# Patient Record
Sex: Female | Born: 1955 | Hispanic: Refuse to answer | Marital: Married | Smoking: Former smoker
Health system: Southern US, Community
[De-identification: ages and names within clinical notes are randomized; demographics above are authoritative.]

## PROBLEM LIST (undated history)

## (undated) DIAGNOSIS — N6019 Diffuse cystic mastopathy of unspecified breast: Secondary | ICD-10-CM

## (undated) DIAGNOSIS — K219 Gastro-esophageal reflux disease without esophagitis: Secondary | ICD-10-CM

## (undated) DIAGNOSIS — D229 Melanocytic nevi, unspecified: Secondary | ICD-10-CM

## (undated) DIAGNOSIS — F329 Major depressive disorder, single episode, unspecified: Secondary | ICD-10-CM

## (undated) DIAGNOSIS — E785 Hyperlipidemia, unspecified: Secondary | ICD-10-CM

## (undated) DIAGNOSIS — E559 Vitamin D deficiency, unspecified: Secondary | ICD-10-CM

## (undated) DIAGNOSIS — F419 Anxiety disorder, unspecified: Secondary | ICD-10-CM

## (undated) DIAGNOSIS — C4491 Basal cell carcinoma of skin, unspecified: Secondary | ICD-10-CM

## (undated) DIAGNOSIS — F32A Depression, unspecified: Secondary | ICD-10-CM

## (undated) DIAGNOSIS — M8580 Other specified disorders of bone density and structure, unspecified site: Secondary | ICD-10-CM

## (undated) DIAGNOSIS — R7303 Prediabetes: Secondary | ICD-10-CM

## (undated) DIAGNOSIS — T7840XA Allergy, unspecified, initial encounter: Secondary | ICD-10-CM

## (undated) HISTORY — PX: HERNIA REPAIR: SHX51

## (undated) HISTORY — DX: Depression, unspecified: F32.A

## (undated) HISTORY — DX: Allergy, unspecified, initial encounter: T78.40XA

## (undated) HISTORY — DX: Hyperlipidemia, unspecified: E78.5

## (undated) HISTORY — DX: Major depressive disorder, single episode, unspecified: F32.9

## (undated) HISTORY — DX: Prediabetes: R73.03

## (undated) HISTORY — DX: Diffuse cystic mastopathy of unspecified breast: N60.19

## (undated) HISTORY — DX: Vitamin D deficiency, unspecified: E55.9

## (undated) HISTORY — DX: Other specified disorders of bone density and structure, unspecified site: M85.80

## (undated) HISTORY — PX: CERVIX LESION DESTRUCTION: SHX591

---
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
## 1999-01-18 ENCOUNTER — Other Ambulatory Visit: Admission: RE | Admit: 1999-01-18 | Discharge: 1999-01-18 | Payer: Self-pay | Admitting: *Deleted

## 2000-01-28 ENCOUNTER — Other Ambulatory Visit: Admission: RE | Admit: 2000-01-28 | Discharge: 2000-01-28 | Payer: Self-pay | Admitting: *Deleted

## 2001-02-11 ENCOUNTER — Other Ambulatory Visit: Admission: RE | Admit: 2001-02-11 | Discharge: 2001-02-11 | Payer: Self-pay | Admitting: *Deleted

## 2001-06-04 ENCOUNTER — Encounter (INDEPENDENT_AMBULATORY_CARE_PROVIDER_SITE_OTHER): Payer: Self-pay | Admitting: Specialist

## 2001-06-04 ENCOUNTER — Other Ambulatory Visit: Admission: RE | Admit: 2001-06-04 | Discharge: 2001-06-04 | Payer: Self-pay | Admitting: *Deleted

## 2002-04-13 DIAGNOSIS — D229 Melanocytic nevi, unspecified: Secondary | ICD-10-CM

## 2002-04-13 HISTORY — DX: Melanocytic nevi, unspecified: D22.9

## 2003-02-08 ENCOUNTER — Other Ambulatory Visit: Admission: RE | Admit: 2003-02-08 | Discharge: 2003-02-08 | Payer: Self-pay | Admitting: Obstetrics & Gynecology

## 2004-03-13 ENCOUNTER — Other Ambulatory Visit: Admission: RE | Admit: 2004-03-13 | Discharge: 2004-03-13 | Payer: Self-pay | Admitting: Obstetrics & Gynecology

## 2004-12-08 HISTORY — PX: DILATION AND CURETTAGE OF UTERUS: SHX78

## 2004-12-08 HISTORY — PX: NOVASURE ABLATION: SHX5394

## 2005-03-26 ENCOUNTER — Other Ambulatory Visit: Admission: RE | Admit: 2005-03-26 | Discharge: 2005-03-26 | Payer: Self-pay | Admitting: Obstetrics & Gynecology

## 2005-07-07 ENCOUNTER — Encounter: Admission: RE | Admit: 2005-07-07 | Discharge: 2005-07-07 | Payer: Self-pay | Admitting: Obstetrics & Gynecology

## 2005-11-07 ENCOUNTER — Encounter (INDEPENDENT_AMBULATORY_CARE_PROVIDER_SITE_OTHER): Payer: Self-pay | Admitting: Specialist

## 2005-11-07 ENCOUNTER — Ambulatory Visit (HOSPITAL_COMMUNITY): Admission: RE | Admit: 2005-11-07 | Discharge: 2005-11-07 | Payer: Self-pay | Admitting: Obstetrics & Gynecology

## 2007-04-08 LAB — HM DEXA SCAN

## 2008-02-04 ENCOUNTER — Ambulatory Visit (HOSPITAL_COMMUNITY): Admission: RE | Admit: 2008-02-04 | Discharge: 2008-02-04 | Payer: Self-pay | Admitting: Internal Medicine

## 2008-02-06 LAB — HM COLONOSCOPY

## 2011-04-25 NOTE — Op Note (Signed)
[name], [name] NO.:  [id number]   MEDICAL RECORD NO.:  [id number]          PATIENT TYPE:  AMB   LOCATION:  SDC                           FACILITY:  [facility]   PHYSICIAN:  Freddy Finner, M.D.   DATE OF BIRTH:  [birth year]   DATE OF PROCEDURE:  11/07/2005  DATE OF DISCHARGE:                                 OPERATIVE REPORT   PREOPERATIVE. DIAGNOSES:  1.  New finding of uterine fibroid.  2.  Filling defect on sonohysterogram.  3.  Clinical symptoms of menorrhagia.   POSTOPERATIVE DIAGNOSES:  1.  Submucous myoma.  2.  Intramural myomata.   OPERATIVE PROCEDURE:  Hysteroscopy, dilatation and curettage, submucous  myomectomy, NovaSure endometrial ablation.   SURGEON:  Freddy Finner, M.D.   ANESTHESIA:  General.   INTRAOPERATIVE COMPLICATIONS:  Cervical laceration, repaired without  difficulty and without sequelae.   The patient is a 55 year old with a new clinical finding of uterine  leiomyomata and a new clinical pattern of menorrhagia.  Sonohysterogram  revealed a 10 x 8 mm filling defect in the endometrial cavity consistent  with a polyp or myoma.  Based on her findings and symptoms, she is now  admitted for a hysteroscopy, D&C and NovaSure endometrial ablation.   She was admitted on the morning of surgery.  She was given __________  antibiotic preoperatively.  She was brought to the operating room, placed  under adequate general anesthesia and placed in the dorsal lithotomy  position.  A Betadine prep was carried out in the usual fashion.  The cervix  was visualized using a bivalve speculum.  The cervix was grasped on the  anterior lip with a single-tooth tenaculum.  The cervix sounded to 2 cm.  Endometrial cavity and cervix sounded to 8.5 with a cavity length then of  6.5 cm.  The cervix was progressively dilated with Hegar dilators to 25.  The cervix was very stenotic, and dilatation required some significant  effort.  This produced a laceration of  the anterior cervical lip requiring a  Jacobs tenaculum to complete the initial course of the procedure.  After  adequately dilating the cervix, a 12.5 degree ACMI hysteroscope was  introduced using lactated Ringer's as a distending medium.  The mass was  identified and photographed.  Using ring forceps and the curette, the  submucous myoma was retrieved and the endometrium curetted and a sample  submitted for examination.  Reinspection of the cavity revealed absence of  the mass.  The NovaSure device was then placed in the standard fashion and  endometrial ablation was carried out.  A total of 53 seconds was required,  total power of 80.  The cavity width was 2.5 cm.  Reinspection of the cavity  revealed complete endometrial ablation visibly.  Photographs were made of  this also and are retained in the office record.  The procedure at this  point was terminated.  The cervical laceration on the anterior  lip was sutured with a 0 Monocryl with complete hemostasis and closure of  the mucosa.  The patient was  given Toradol 30 mg IV, 30 mg IM.  She was  awakened and taken to recovery in good condition.  She will be given routine  outpatient surgical instructions.  She is to follow up in the office in  approximately two weeks.      Freddy Finner, M.D.  Electronically Signed     WRN/MEDQ  D:  11/07/2005  T:  11/07/2005  Job:  [id number]

## 2011-08-05 ENCOUNTER — Other Ambulatory Visit: Payer: Self-pay | Admitting: Physician Assistant

## 2011-10-07 ENCOUNTER — Other Ambulatory Visit: Payer: Self-pay | Admitting: Physician Assistant

## 2011-10-07 DIAGNOSIS — C4491 Basal cell carcinoma of skin, unspecified: Secondary | ICD-10-CM

## 2011-10-07 HISTORY — DX: Basal cell carcinoma of skin, unspecified: C44.91

## 2011-10-09 HISTORY — PX: SKIN CANCER EXCISION: SHX779

## 2011-12-09 LAB — HM PAP SMEAR

## 2012-01-20 ENCOUNTER — Ambulatory Visit
Admission: RE | Admit: 2012-01-20 | Discharge: 2012-01-20 | Disposition: A | Discharge status: Home / Self Care | Payer: 59 | Source: Ambulatory Visit | Attending: Internal Medicine | Admitting: Internal Medicine

## 2012-01-20 ENCOUNTER — Other Ambulatory Visit: Payer: Self-pay | Admitting: Internal Medicine

## 2012-01-20 DIAGNOSIS — M25511 Pain in right shoulder: Secondary | ICD-10-CM

## 2012-05-17 LAB — FECAL OCCULT BLOOD, GUAIAC: Fecal Occult Blood: NEGATIVE

## 2012-09-08 ENCOUNTER — Other Ambulatory Visit: Payer: Self-pay | Admitting: Obstetrics & Gynecology

## 2012-09-08 DIAGNOSIS — N6320 Unspecified lump in the left breast, unspecified quadrant: Secondary | ICD-10-CM

## 2012-09-14 ENCOUNTER — Ambulatory Visit
Admission: RE | Admit: 2012-09-14 | Discharge: 2012-09-14 | Disposition: A | Discharge status: Home / Self Care | Payer: 59 | Source: Ambulatory Visit | Attending: Obstetrics & Gynecology | Admitting: Obstetrics & Gynecology

## 2012-09-14 DIAGNOSIS — N6320 Unspecified lump in the left breast, unspecified quadrant: Secondary | ICD-10-CM

## 2012-12-08 LAB — HM MAMMOGRAPHY

## 2013-09-27 ENCOUNTER — Encounter: Payer: Self-pay | Admitting: Internal Medicine

## 2013-10-12 ENCOUNTER — Ambulatory Visit: Payer: 59 | Admitting: Emergency Medicine

## 2013-10-12 ENCOUNTER — Encounter: Payer: Self-pay | Admitting: Emergency Medicine

## 2013-10-12 VITALS — BP 122/64 | HR 62 | Temp 98.3°F | Resp 16 | Wt 117.0 lb

## 2013-10-12 DIAGNOSIS — E785 Hyperlipidemia, unspecified: Secondary | ICD-10-CM

## 2013-10-12 DIAGNOSIS — E782 Mixed hyperlipidemia: Secondary | ICD-10-CM

## 2013-10-12 DIAGNOSIS — F32A Depression, unspecified: Secondary | ICD-10-CM

## 2013-10-12 DIAGNOSIS — F329 Major depressive disorder, single episode, unspecified: Secondary | ICD-10-CM

## 2013-10-12 DIAGNOSIS — E559 Vitamin D deficiency, unspecified: Secondary | ICD-10-CM

## 2013-10-12 LAB — HEPATIC FUNCTION PANEL
ALT: 14 U/L (ref 0–35)
AST: 20 U/L (ref 0–37)
Albumin: 4.5 g/dL (ref 3.5–5.2)
Alkaline Phosphatase: 41 U/L (ref 39–117)
Bilirubin, Direct: 0.1 mg/dL (ref 0.0–0.3)
Indirect Bilirubin: 0.5 mg/dL (ref 0.0–0.9)
Total Bilirubin: 0.6 mg/dL (ref 0.3–1.2)
Total Protein: 7.1 g/dL (ref 6.0–8.3)

## 2013-10-12 LAB — BASIC METABOLIC PANEL WITH GFR
BUN: 15 mg/dL (ref 6–23)
CO2: 30 mEq/L (ref 19–32)
Calcium: 9.6 mg/dL (ref 8.4–10.5)
Chloride: 101 mEq/L (ref 96–112)
Creat: 0.78 mg/dL (ref 0.50–1.10)
GFR, Est African American: >89 mL/min
GFR, Est Non African American: 85 mL/min
Glucose, Bld: 85 mg/dL (ref 70–99)
Potassium: 4.3 mEq/L (ref 3.5–5.3)
Sodium: 140 mEq/L (ref 135–145)

## 2013-10-12 LAB — CBC WITH DIFFERENTIAL/PLATELET
Basophils Absolute: 0.1 10*3/uL (ref 0.0–0.1)
Basophils Relative: 1 % (ref 0–1)
Eosinophils Absolute: 0.1 10*3/uL (ref 0.0–0.7)
Eosinophils Relative: 1 % (ref 0–5)
HCT: 40.2 % (ref 36.0–46.0)
Hemoglobin: 13.7 g/dL (ref 12.0–15.0)
Lymphocytes Relative: 36 % (ref 12–46)
Lymphs Abs: 2.4 10*3/uL (ref 0.7–4.0)
MCH: 32.4 pg (ref 26.0–34.0)
MCHC: 34.1 g/dL (ref 30.0–36.0)
MCV: 95 fL (ref 78.0–100.0)
Monocytes Absolute: 0.4 10*3/uL (ref 0.1–1.0)
Monocytes Relative: 5 % (ref 3–12)
Neutro Abs: 4 10*3/uL (ref 1.7–7.7)
Neutrophils Relative %: 57 % (ref 43–77)
Platelets: 281 10*3/uL (ref 150–400)
RBC: 4.23 MIL/uL (ref 3.87–5.11)
RDW: 13.5 % (ref 11.5–15.5)
WBC: 6.9 10*3/uL (ref 4.0–10.5)

## 2013-10-12 LAB — LIPID PANEL
Cholesterol: 223 mg/dL — ABNORMAL HIGH (ref 0–200)
HDL: 74 mg/dL (ref >39)
LDL Cholesterol: 134 mg/dL — ABNORMAL HIGH (ref 0–99)
Total CHOL/HDL Ratio: 3 Ratio
Triglycerides: 75 mg/dL (ref <150)
VLDL: 15 mg/dL (ref 0–40)

## 2013-10-12 LAB — MAGNESIUM: Magnesium: 1.9 mg/dL (ref 1.5–2.5)

## 2013-10-12 NOTE — Patient Instructions (Signed)
Vitamin D Deficiency Vitamin D is an important vitamin that your body needs. Having too little of it in your body is called a deficiency. A very bad deficiency can make your bones soft and can cause a condition called rickets.  Vitamin D is important to your body for different reasons, such as:   It helps your body absorb 2 minerals called calcium and phosphorus.  It helps make your bones healthy.  It may prevent some diseases, such as diabetes and multiple sclerosis.  It helps your muscles and heart. You can get vitamin D in several ways. It is a natural part of some foods. The vitamin is also added to some dairy products and cereals. Some people take vitamin D supplements. Also, your body makes vitamin D when you are in the sun. It changes the sun's rays into a form of the vitamin that your body can use. CAUSES   Not eating enough foods that contain vitamin D.  Not getting enough sunlight.  Having certain digestive system diseases that make it hard to absorb vitamin D. These diseases include Crohn's disease, chronic pancreatitis, and cystic fibrosis.  Having a surgery in which part of the stomach or small intestine is removed.  Being obese. Fat cells pull vitamin D out of your blood. That means that obese people may not have enough vitamin D left in their blood and in other body tissues.  Having chronic kidney or liver disease. RISK FACTORS Risk factors are things that make you more likely to develop a vitamin D deficiency. They include:  Being older.  Not being able to get outside very much.  Living in a nursing home.  Having had broken bones.  Having weak or thin bones (osteoporosis).  Having a disease or condition that changes how your body absorbs vitamin D.  Having dark skin.  Some medicines such as seizure medicines or steroids.  Being overweight or obese. SYMPTOMS Mild cases of vitamin D deficiency may not have any symptoms. If you have a very bad case, symptoms  may include:  Bone pain.  Muscle pain.  Falling often.  Broken bones caused by a minor injury, due to osteoporosis. DIAGNOSIS A blood test is the best way to tell if you have a vitamin D deficiency. TREATMENT Vitamin D deficiency can be treated in different ways. Treatment for vitamin D deficiency depends on what is causing it. Options include:  Taking vitamin D supplements.  Taking a calcium supplement. Your caregiver will suggest what dose is best for you. HOME CARE INSTRUCTIONS  Take any supplements that your caregiver prescribes. Follow the directions carefully. Take only the suggested amount.  Have your blood tested 2 months after you start taking supplements.  Eat foods that contain vitamin D. Healthy choices include:  Fortified dairy products, cereals, or juices. Fortified means vitamin D has been added to the food. Check the label on the package to be sure.  Fatty fish like salmon or trout.  Eggs.  Oysters.  Spend some time in the sun. Most people should get out in the sun without sunblock for 10 to 15 minutes at a time. Do this 3 times a week. People who have had skin cancer should not do this. Ask your caregiver how long you should be in the sun. Do not use a tanning bed.  Keep your weight at a healthy level. Lose weight if you need to.  Keep all follow-up appointments. Your caregiver will need to perform blood tests to make sure your  vitamin D deficiency is going away. SEEK MEDICAL CARE IF:  You have any questions about your treatment.  You continue to have symptoms of vitamin D deficiency.  You have nausea or vomiting.  You are constipated.  You feel confused.  You have severe abdominal or back pain. MAKE SURE YOU:  Understand these instructions.  Will watch your condition.  Will get help right away if you are not doing well or get worse. Document Released: 02/16/2012 Document Reviewed: 02/16/2012 [facility] Patient Information 2014 [place],  [place]. Hypertriglyceridemia  Diet for High blood levels of Triglycerides Most fats in food are triglycerides. Triglycerides in your blood are stored as fat in your body. High levels of triglycerides in your blood may put you at a greater risk for heart disease and stroke.  Normal triglyceride levels are less than 150 mg/dL. Borderline high levels are 150-199 mg/dl. High levels are 200 - 499 mg/dL, and very high triglyceride levels are greater than 500 mg/dL. The decision to treat high triglycerides is generally based on the level. For people with borderline or high triglyceride levels, treatment includes weight loss and exercise. Drugs are recommended for people with very high triglyceride levels. Many people who need treatment for high triglyceride levels have metabolic syndrome. This syndrome is a collection of disorders that often include: insulin resistance, high blood pressure, blood clotting problems, high cholesterol and triglycerides. TESTING PROCEDURE FOR TRIGLYCERIDES  You should not eat 4 hours before getting your triglycerides measured. The normal range of triglycerides is between 10 and 250 milligrams per deciliter (mg/dl). Some people may have extreme levels (1000 or above), but your triglyceride level may be too high if it is above 150 mg/dl, depending on what other risk factors you have for heart disease.  People with high blood triglycerides may also have high blood cholesterol levels. If you have high blood cholesterol as well as high blood triglycerides, your risk for heart disease is probably greater than if you only had high triglycerides. High blood cholesterol is one of the main risk factors for heart disease. CHANGING YOUR DIET  Your weight can affect your blood triglyceride level. If you are more than 20% above your ideal body weight, you may be able to lower your blood triglycerides by losing weight. Eating less and exercising regularly is the best way to combat this. Fat provides  more calories than any other food. The best way to lose weight is to eat less fat. Only 30% of your total calories should come from fat. Less than 7% of your diet should come from saturated fat. A diet low in fat and saturated fat is the same as a diet to decrease blood cholesterol. By eating a diet lower in fat, you may lose weight, lower your blood cholesterol, and lower your blood triglyceride level.  Eating a diet low in fat, especially saturated fat, may also help you lower your blood triglyceride level. Ask your dietitian to help you figure how much fat you can eat based on the number of calories your caregiver has prescribed for you.  Exercise, in addition to helping with weight loss may also help lower triglyceride levels.   Alcohol can increase blood triglycerides. You may need to stop drinking alcoholic beverages.  Too much carbohydrate in your diet may also increase your blood triglycerides. Some complex carbohydrates are necessary in your diet. These may include bread, rice, potatoes, other starchy vegetables and cereals.  Reduce "simple" carbohydrates. These may include pure sugars, candy, honey, and jelly  without losing other nutrients. If you have the kind of high blood triglycerides that is affected by the amount of carbohydrates in your diet, you will need to eat less sugar and less high-sugar foods. Your caregiver can help you with this.  Adding 2-4 grams of fish oil (EPA+ DHA) may also help lower triglycerides. Speak with your caregiver before adding any supplements to your regimen. Following the Diet  Maintain your ideal weight. Your caregivers can help you with a diet. Generally, eating less food and getting more exercise will help you lose weight. Joining a weight control group may also help. Ask your caregivers for a good weight control group in your area.  Eat low-fat foods instead of high-fat foods. This can help you lose weight too.  These foods are lower in fat. Eat MORE of  these:   Dried beans, peas, and lentils.  Egg whites.  Low-fat cottage cheese.  Fish.  Lean cuts of meat, such as round, sirloin, rump, and flank (cut extra fat off meat you fix).  Whole grain breads, cereals and pasta.  Skim and nonfat dry milk.  Low-fat yogurt.  Poultry without the skin.  Cheese made with skim or part-skim milk, such as mozzarella, parmesan, farmers', ricotta, or pot cheese. These are higher fat foods. Eat LESS of these:   Whole milk and foods made from whole milk, such as American, blue, cheddar, monterey jack, and swiss cheese  High-fat meats, such as luncheon meats, sausages, knockwurst, bratwurst, hot dogs, ribs, corned beef, ground pork, and regular ground beef.  Fried foods. Limit saturated fats in your diet. Substituting unsaturated fat for saturated fat may decrease your blood triglyceride level. You will need to read package labels to know which products contain saturated fats.  These foods are high in saturated fat. Eat LESS of these:   Fried pork skins.  Whole milk.  Skin and fat from poultry.  Palm oil.  Butter.  Shortening.  Cream cheese.  Tomasa Blase.  Margarines and baked goods made from listed oils.  Vegetable shortenings.  Chitterlings.  Fat from meats.  Coconut oil.  Palm kernel oil.  Lard.  Cream.  Sour cream.  Fatback.  Coffee whiteners and non-dairy creamers made with these oils.  Cheese made from whole milk. Use unsaturated fats (both polyunsaturated and monounsaturated) moderately. Remember, even though unsaturated fats are better than saturated fats; you still want a diet low in total fat.  These foods are high in unsaturated fat:   Canola oil.  Sunflower oil.  Mayonnaise.  Almonds.  Peanuts.  Pine nuts.  Margarines made with these oils.  Safflower oil.  Olive oil.  Avocados.  Cashews.  Peanut butter.  Sunflower seeds.  Soybean oil.  Peanut  oil.  Olives.  Pecans.  Walnuts.  Pumpkin seeds. Avoid sugar and other high-sugar foods. This will decrease carbohydrates without decreasing other nutrients. Sugar in your food goes rapidly to your blood. When there is excess sugar in your blood, your liver may use it to make more triglycerides. Sugar also contains calories without other important nutrients.  Eat LESS of these:   Sugar, Reale sugar, powdered sugar, jam, jelly, preserves, honey, syrup, molasses, pies, candy, cakes, cookies, frosting, pastries, colas, soft drinks, punches, fruit drinks, and regular gelatin.  Avoid alcohol. Alcohol, even more than sugar, may increase blood triglycerides. In addition, alcohol is high in calories and low in nutrients. Ask for sparkling water, or a diet soft drink instead of an alcoholic beverage. Suggestions for planning and  preparing meals   Bake, broil, grill or roast meats instead of frying.  Remove fat from meats and skin from poultry before cooking.  Add spices, herbs, lemon juice or vinegar to vegetables instead of salt, rich sauces or gravies.  Use a non-stick skillet without fat or use no-stick sprays.  Cool and refrigerate stews and broth. Then remove the hardened fat floating on the surface before serving.  Refrigerate meat drippings and skim off fat to make low-fat gravies.  Serve more fish.  Use less butter, margarine and other high-fat spreads on bread or vegetables.  Use skim or reconstituted non-fat dry milk for cooking.  Cook with low-fat cheeses.  Substitute low-fat yogurt or cottage cheese for all or part of the sour cream in recipes for sauces, dips or congealed salads.  Use half yogurt/half mayonnaise in salad recipes.  Substitute evaporated skim milk for cream. Evaporated skim milk or reconstituted non-fat dry milk can be whipped and substituted for whipped cream in certain recipes.  Choose fresh fruits for dessert instead of high-fat foods such as pies or  cakes. Fruits are naturally low in fat. When Dining Out   Order low-fat appetizers such as fruit or vegetable juice, pasta with vegetables or tomato sauce.  Select clear, rather than cream soups.  Ask that dressings and gravies be served on the side. Then use less of them.  Order foods that are baked, broiled, poached, steamed, stir-fried, or roasted.  Ask for margarine instead of butter, and use only a small amount.  Drink sparkling water, unsweetened tea or coffee, or diet soft drinks instead of alcohol or other sweet beverages. QUESTIONS AND ANSWERS ABOUT OTHER FATS IN THE BLOOD: SATURATED FAT, TRANS FAT, AND CHOLESTEROL What is trans fat? Trans fat is a type of fat that is formed when vegetable oil is hardened through a process called hydrogenation. This process helps makes foods more solid, gives them shape, and prolongs their shelf life. Trans fats are also called hydrogenated or partially hydrogenated oils.  What do saturated fat, trans fat, and cholesterol in foods have to do with heart disease? Saturated fat, trans fat, and cholesterol in the diet all raise the level of LDL "bad" cholesterol in the blood. The higher the LDL cholesterol, the greater the risk for coronary heart disease (CHD). Saturated fat and trans fat raise LDL similarly.  What foods contain saturated fat, trans fat, and cholesterol? High amounts of saturated fat are found in animal products, such as fatty cuts of meat, chicken skin, and full-fat dairy products like butter, whole milk, cream, and cheese, and in tropical vegetable oils such as palm, palm kernel, and coconut oil. Trans fat is found in some of the same foods as saturated fat, such as vegetable shortening, some margarines (especially hard or stick margarine), crackers, cookies, baked goods, fried foods, salad dressings, and other processed foods made with partially hydrogenated vegetable oils. Small amounts of trans fat also occur naturally in some animal  products, such as milk products, beef, and lamb. Foods high in cholesterol include liver, other organ meats, egg yolks, shrimp, and full-fat dairy products. How can I use the new food label to make heart-healthy food choices? Check the Nutrition Facts panel of the food label. Choose foods lower in saturated fat, trans fat, and cholesterol. For saturated fat and cholesterol, you can also use the Percent Daily Value (%DV): 5% DV or less is low, and 20% DV or more is high. (There is no %DV for trans fat.) Use  the Nutrition Facts panel to choose foods low in saturated fat and cholesterol, and if the trans fat is not listed, read the ingredients and limit products that list shortening or hydrogenated or partially hydrogenated vegetable oil, which tend to be high in trans fat. POINTS TO REMEMBER:   Discuss your risk for heart disease with your caregivers, and take steps to reduce risk factors.  Change your diet. Choose foods that are low in saturated fat, trans fat, and cholesterol.  Add exercise to your daily routine if it is not already being done. Participate in physical activity of moderate intensity, like brisk walking, for at least 30 minutes on most, and preferably all days of the week. No time? Break the 30 minutes into three, 10-minute segments during the day.  Stop smoking. If you do smoke, contact your caregiver to discuss ways in which they can help you quit.  Do not use street drugs.  Maintain a normal weight.  Maintain a healthy blood pressure.  Keep up with your blood work for checking the fats in your blood as directed by your caregiver. Document Released: 09/11/2004 Document Revised: 05/25/2012 Document Reviewed: 04/09/2009 [facility] Patient Information 2014 [place], [place].

## 2013-10-13 ENCOUNTER — Telehealth: Payer: Self-pay | Admitting: *Deleted

## 2013-10-13 DIAGNOSIS — F329 Major depressive disorder, single episode, unspecified: Secondary | ICD-10-CM | POA: Insufficient documentation

## 2013-10-13 DIAGNOSIS — T7840XA Allergy, unspecified, initial encounter: Secondary | ICD-10-CM | POA: Insufficient documentation

## 2013-10-13 DIAGNOSIS — E785 Hyperlipidemia, unspecified: Secondary | ICD-10-CM | POA: Insufficient documentation

## 2013-10-13 DIAGNOSIS — E782 Mixed hyperlipidemia: Secondary | ICD-10-CM

## 2013-10-13 DIAGNOSIS — E559 Vitamin D deficiency, unspecified: Secondary | ICD-10-CM | POA: Insufficient documentation

## 2013-10-13 DIAGNOSIS — F339 Major depressive disorder, recurrent, unspecified: Secondary | ICD-10-CM | POA: Insufficient documentation

## 2013-10-13 DIAGNOSIS — F32A Depression, unspecified: Secondary | ICD-10-CM | POA: Insufficient documentation

## 2013-10-13 LAB — VITAMIN D 25 HYDROXY (VIT D DEFICIENCY, FRACTURES): Vit D, 25-Hydroxy: 46 ng/mL (ref 30–89)

## 2013-10-13 MED ORDER — PRAVASTATIN SODIUM 40 MG PO TABS: 40.0000 mg | ORAL_TABLET | Freq: Every day | ORAL | Status: DC

## 2013-10-13 NOTE — Progress Notes (Signed)
  Subjective:    Patient ID: Norma Cameron, female    DOB: [birth year], 57 y.o.   MRN: [id number]  HPI Comments: 57 yo female presents for 3 month F/U for Cholesterol and D. Deficient recheck. Eating healthy, exercising regularly. No concerns or complaints. She was able to increase RYRE to 3 qd with any SE. Last labs T 219 TG 320 HDL 69 LDL 86 D 55   Hyperlipidemia     Current Outpatient Prescriptions on File Prior to Visit  Medication Sig Dispense Refill  . ALPRAZolam (XANAX) 0.5 MG tablet Take 0.5 mg by mouth 2 (two) times daily.      . Ascorbic Acid (VITAMIN C) 100 MG tablet Take 100 mg by mouth daily.      . calcium & magnesium carbonates (MYLANTA) 311-232 MG per tablet Take 1 tablet by mouth daily.      Marland Kitchen co-enzyme Q-10 30 MG capsule Take 30 mg by mouth 3 (three) times daily.      Marland Kitchen estrogens, conjugated, (PREMARIN) 0.625 MG tablet Take 0.625 mg by mouth daily. Take daily for 21 days then do not take for 7 days.      . Flaxseed, Linseed, (FLAX SEED OIL) 1000 MG CAPS Take 1,000 mg by mouth daily.      . hydrochlorothiazide (HYDRODIURIL) 25 MG tablet Take 25 mg by mouth daily.      Boris Lown Oil 1000 MG CAPS Take 1,000 mg by mouth daily.      . Red Yeast Rice 600 MG CAPS Take 600 mg by mouth 3 (three) times daily.       Marland Kitchen saccharomyces boulardii (FLORASTOR) 250 MG capsule Take 250 mg by mouth 2 (two) times daily.      . vitamin B-12 (CYANOCOBALAMIN) 100 MCG tablet Take 50 mcg by mouth daily.      . vitamin E 100 UNIT capsule Take 100 Units by mouth daily.       No current facility-administered medications on file prior to visit.  ALLERGIES Eggs or egg-derived products; Neosporin; Red yeast rice; Sulfa antibiotics; and Zostavax Past Medical History  Diagnosis Date  . Hyperlipidemia   . Vitamin D deficiency   . Allergy   . Depression     CONTROLLED WITH OCCASIONAL XANAX  . Osteopenia   . Fibrocystic breast disease     Review of Systems  Constitutional: Negative.   HENT:  Negative.   Respiratory: Negative.   Cardiovascular: Negative.   Gastrointestinal: Negative.   Musculoskeletal: Negative.   Psychiatric/Behavioral: Negative.        Objective:   Physical Exam  Nursing note and vitals reviewed. Constitutional: She is oriented to person, place, and time. She appears well-developed and well-nourished.  HENT:  Head: Normocephalic.  Eyes: Pupils are equal, round, and reactive to light.  Neck: Normal range of motion.  Cardiovascular: Normal rate, regular rhythm, normal heart sounds and intact distal pulses.   Pulmonary/Chest: Effort normal and breath sounds normal.  Abdominal: Soft. Bowel sounds are normal.  Musculoskeletal: Normal range of motion.  Neurological: She is alert and oriented to person, place, and time.  Skin: Skin is warm and dry.  Psychiatric: She has a normal mood and affect. Judgment normal.          Assessment & Plan:  3 MONTH recheck cholesterol and vitamin D. Check labs continue RX/ Vit AD

## 2013-10-13 NOTE — Telephone Encounter (Signed)
Spoke with pt about lab results from 10/12/13.  Advised to add 250mg  magnesium and 2,000IU Vitamin D per Loree Fee, PA-C and routed pt to scheduling for 6 week f/u lab only to be scheduled.  Rx for Pravastatin 40mg  faxed to pt's pharmacy.Advised pt to D/C Red Yeast Rice Extract with new start of Pravastatin Rx.

## 2013-10-13 NOTE — Telephone Encounter (Signed)
Message copied by Nicholaus Corolla A on Thu Oct 13, 2013 10:08 AM ------      Message from: [place], [place] R      Created: Thu Oct 13, 2013  9:10 AM       Cholesterol still elevated with + [facility] of heart disease really need to start RX Pravastatin 40 mg 1 po qhs #30/ 3 refills. 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 Also add 250 mg MAG and 2000 IU vit D ------

## 2013-11-23 ENCOUNTER — Other Ambulatory Visit: Payer: Self-pay | Admitting: Emergency Medicine

## 2013-11-23 DIAGNOSIS — E782 Mixed hyperlipidemia: Secondary | ICD-10-CM

## 2013-11-24 ENCOUNTER — Other Ambulatory Visit: Payer: 59

## 2013-11-24 DIAGNOSIS — E782 Mixed hyperlipidemia: Secondary | ICD-10-CM

## 2013-11-24 LAB — CBC WITH DIFFERENTIAL/PLATELET
Basophils Absolute: 0.1 10*3/uL (ref 0.0–0.1)
Basophils Relative: 1 % (ref 0–1)
Eosinophils Absolute: 0.1 10*3/uL (ref 0.0–0.7)
Eosinophils Relative: 2 % (ref 0–5)
HCT: 41.8 % (ref 36.0–46.0)
Hemoglobin: 14.1 g/dL (ref 12.0–15.0)
Lymphocytes Relative: 43 % (ref 12–46)
Lymphs Abs: 2.3 10*3/uL (ref 0.7–4.0)
MCH: 32.2 pg (ref 26.0–34.0)
MCHC: 33.7 g/dL (ref 30.0–36.0)
MCV: 95.4 fL (ref 78.0–100.0)
Monocytes Absolute: 0.4 10*3/uL (ref 0.1–1.0)
Monocytes Relative: 7 % (ref 3–12)
Neutro Abs: 2.5 10*3/uL (ref 1.7–7.7)
Neutrophils Relative %: 47 % (ref 43–77)
Platelets: 249 10*3/uL (ref 150–400)
RBC: 4.38 MIL/uL (ref 3.87–5.11)
RDW: 13.8 % (ref 11.5–15.5)
WBC: 5.3 10*3/uL (ref 4.0–10.5)

## 2013-11-24 LAB — HEPATIC FUNCTION PANEL
ALT: 19 U/L (ref 0–35)
AST: 24 U/L (ref 0–37)
Albumin: 4.6 g/dL (ref 3.5–5.2)
Alkaline Phosphatase: 43 U/L (ref 39–117)
Bilirubin, Direct: 0.1 mg/dL (ref 0.0–0.3)
Indirect Bilirubin: 0.5 mg/dL (ref 0.0–0.9)
Total Bilirubin: 0.6 mg/dL (ref 0.3–1.2)
Total Protein: 6.9 g/dL (ref 6.0–8.3)

## 2013-11-24 LAB — LIPID PANEL
Cholesterol: 185 mg/dL (ref 0–200)
HDL: 79 mg/dL (ref >39)
LDL Cholesterol: 88 mg/dL (ref 0–99)
Total CHOL/HDL Ratio: 2.3 Ratio
Triglycerides: 90 mg/dL (ref <150)
VLDL: 18 mg/dL (ref 0–40)

## 2013-12-05 ENCOUNTER — Other Ambulatory Visit: Payer: Self-pay | Admitting: Physician Assistant

## 2013-12-05 DIAGNOSIS — E782 Mixed hyperlipidemia: Secondary | ICD-10-CM

## 2013-12-05 MED ORDER — PRAVASTATIN SODIUM 40 MG PO TABS: 40.0000 mg | ORAL_TABLET | Freq: Every day | ORAL | Status: DC

## 2014-03-21 ENCOUNTER — Other Ambulatory Visit: Payer: Self-pay | Admitting: Physician Assistant

## 2014-04-25 ENCOUNTER — Encounter: Payer: Self-pay | Admitting: Emergency Medicine

## 2014-04-25 ENCOUNTER — Ambulatory Visit (INDEPENDENT_AMBULATORY_CARE_PROVIDER_SITE_OTHER): Payer: 59 | Admitting: Emergency Medicine

## 2014-04-25 VITALS — BP 102/60 | HR 62 | Temp 98.2°F | Resp 18 | Ht 61.5 in | Wt 121.0 lb

## 2014-04-25 DIAGNOSIS — R6889 Other general symptoms and signs: Secondary | ICD-10-CM

## 2014-04-25 DIAGNOSIS — Z0000 Encounter for general adult medical examination without abnormal findings: Secondary | ICD-10-CM

## 2014-04-25 DIAGNOSIS — E782 Mixed hyperlipidemia: Secondary | ICD-10-CM

## 2014-04-25 DIAGNOSIS — Z111 Encounter for screening for respiratory tuberculosis: Secondary | ICD-10-CM

## 2014-04-25 DIAGNOSIS — R05 Cough: Secondary | ICD-10-CM

## 2014-04-25 DIAGNOSIS — R059 Cough, unspecified: Secondary | ICD-10-CM

## 2014-04-25 DIAGNOSIS — I10 Essential (primary) hypertension: Secondary | ICD-10-CM

## 2014-04-25 LAB — CBC WITH DIFFERENTIAL/PLATELET
Basophils Absolute: 0.1 10*3/uL (ref 0.0–0.1)
Basophils Relative: 1 % (ref 0–1)
Eosinophils Absolute: 0.1 10*3/uL (ref 0.0–0.7)
Eosinophils Relative: 2 % (ref 0–5)
HCT: 38.4 % (ref 36.0–46.0)
Hemoglobin: 12.8 g/dL (ref 12.0–15.0)
Lymphocytes Relative: 47 % — ABNORMAL HIGH (ref 12–46)
Lymphs Abs: 2.8 10*3/uL (ref 0.7–4.0)
MCH: 32.5 pg (ref 26.0–34.0)
MCHC: 33.3 g/dL (ref 30.0–36.0)
MCV: 97.5 fL (ref 78.0–100.0)
Monocytes Absolute: 0.4 10*3/uL (ref 0.1–1.0)
Monocytes Relative: 7 % (ref 3–12)
Neutro Abs: 2.5 10*3/uL (ref 1.7–7.7)
Neutrophils Relative %: 43 % (ref 43–77)
Platelets: 221 10*3/uL (ref 150–400)
RBC: 3.94 MIL/uL (ref 3.87–5.11)
RDW: 13.4 % (ref 11.5–15.5)
WBC: 5.9 10*3/uL (ref 4.0–10.5)

## 2014-04-25 LAB — HEMOGLOBIN A1C
Hgb A1c MFr Bld: 5.8 % — ABNORMAL HIGH (ref <5.7)
Mean Plasma Glucose: 120 mg/dL — ABNORMAL HIGH (ref <117)

## 2014-04-25 NOTE — Patient Instructions (Signed)
Edema °Edema is a buildup of fluids. It is most common in the feet, ankles, and legs. This happens more as a person ages. It may affect one or both legs. °HOME CARE  °· Raise (elevate) the legs or ankles above the level of the heart while lying down. °· Avoid sitting or standing still for a long time. °· Exercise the legs to help the puffiness (swelling) go down. °· A low-salt diet may help lessen the puffiness. °· Only take medicine as told by your doctor. °GET HELP RIGHT AWAY IF:  °· You develop shortness of breath or chest pain. °· You cannot breathe when you lie down. °· You have more puffiness that does not go away with treatment. °· You develop pain or redness in the areas that are puffy. °· You have a temperature by mouth above 102° F (38.9° C), not controlled by medicine. °· You gain 03 lb/1.4 kg or more in 1 day or 05 lb/2.3 kg in a week. °MAKE SURE YOU:  °· Understand these instructions. °· Will watch your condition. °· Will get help right away if you are not doing well or get worse. °Document Released: 05/12/2008 Document Revised: 02/16/2012 Document Reviewed: 05/12/2008 °[employer]® Patient Information ©2014 [employer]. ° °

## 2014-04-25 NOTE — Progress Notes (Signed)
Subjective:    Patient ID: Norma Cameron, female    DOB: May 21, 1956, 58 y.o.   MRN: [id number]  HPI Comments: 55 WF CPE and cholesterol f/u. She notes Lexapro has helped with anxiety and OCD. She has gained a little weight but is still exercising and eating healthy. She has sweet tooth is only [employer].   She takes HCTZ for swelling when it occurs. She DOES NOT have HTN. She is doing well overall w/o any concerns.  Last Chol 223 down to 185 LDL 134 down to 88 with adding Pravastatin   Medication List       This list is accurate as of: 04/25/14 11:59 PM.  Always use your most recent med list.               ALPRAZolam 0.5 MG tablet  Commonly known as:  XANAX  Take 0.5 mg by mouth 2 (two) times daily.     Biotin 10 MG Tabs  Take by mouth daily.     calcium & magnesium carbonates 311-232 MG per tablet  Commonly known as:  MYLANTA  Take 1 tablet by mouth daily.     co-enzyme Q-10 30 MG capsule  Take 30 mg by mouth 3 (three) times daily.     Cyanocobalamin 1000 MCG Caps  Take 1,000 mcg by mouth daily.     escitalopram 20 MG tablet  Commonly known as:  LEXAPRO  Take 20 mg by mouth daily.     estradiol 1 MG tablet  Commonly known as:  ESTRACE  Take 1 mg by mouth daily.     hydrochlorothiazide 25 MG tablet  Commonly known as:  HYDRODIURIL  Take 25 mg by mouth as needed.     Magnesium 500 MG Caps  Take 500 mg by mouth at bedtime.     pravastatin 40 MG tablet  Commonly known as:  PRAVACHOL  TAKE 1 TABLET (40 MG TOTAL) BY MOUTH AT BEDTIME.     PROBIOTIC DAILY PO  Take by mouth daily.     progesterone 100 MG capsule  Commonly known as:  PROMETRIUM  Take 100 mg by mouth. Takes every 90 days for 10 days       Allergies  Allergen Reactions  . Eggs Or Egg-Derived Products Hives  . Neosporin [Neomycin-Bacitracin Zn-Polymyx] Hives  . Red Yeast Rice [Cholestin] Other (See Comments)    MYALGIA with higher doses  . Sulfa Antibiotics Hives  . Zostavax [Zoster  Vaccine Live] Rash   Past Medical History  Diagnosis Date  . Hyperlipidemia   . Vitamin D deficiency   . Allergy   . Depression     CONTROLLED WITH OCCASIONAL XANAX  . Osteopenia   . Fibrocystic breast disease    Past Surgical History  Procedure Laterality Date  . Dilation and curettage of uterus  2006  . Novasure ablation  2006  . Cervix lesion destruction      EARLY 20'S  . Skin cancer excision Right 10/2011    LOWER EXT BCC   Family History  Problem Relation Age of Onset  . Hypertension Mother   . Hyperlipidemia Mother   . Heart disease Father     CABG  . Hyperlipidemia Father   . Hypertension Father   . Cancer Paternal Uncle     THROAT  . Heart disease Paternal Grandfather   . Diabetes Paternal Grandfather    MAINTENANCE: Colonoscopy:05/08/13 WNL, but Polyp HX recheck 2019 Mammo: 12/08/2012 WNL at GYN BMD: 2014  WNL at GYN Pap/ Pelvic: 06/2013 EYE: 5/ 2015 WNL contacts Dentist:1/ 2015 WNL  DERM: Q 6 month for Basal cell f/u  IMMUNIZATIONS: Tdap:2009 Pneumovax: n/a Zostavax:2014 Influenza: EGG ALlergy  Patient Care Team: Unk Pinto, MD as PCP - General (Internal Medicine) Macarthur Critchley, OD as Referring Physician (Optometry) Mayme Genta, MD as Consulting Physician (Gastroenterology) Maisie Fus, MD as Consulting Physician (Obstetrics and Gynecology) Fayette Pho, MD as Referring Physician (Orthopedic Surgery) Lavonna Monarch, MD as Consulting Physician (Dermatology) [place], (Dentist)   Review of Systems  All other systems reviewed and are negative.  BP 102/60  Pulse 62  Temp(Src) 98.2 F (36.8 C) (Temporal)  Resp 18  Ht 5' 1.5" (1.562 m)  Wt 121 lb (54.885 kg)  BMI 22.50 kg/m2 AORTA SCAN ? abnormal EKG NSCSPT     Objective:   Physical Exam  Nursing note and vitals reviewed. Constitutional: She is oriented to person, place, and time. She appears well-developed and well-nourished. No distress.  HENT:  Head: Normocephalic and atraumatic.   Right Ear: External ear normal.  Left Ear: External ear normal.  Nose: Nose normal.  Mouth/Throat: Oropharynx is clear and moist.  Eyes: Conjunctivae and EOM are normal. Pupils are equal, round, and reactive to light. Right eye exhibits no discharge. Left eye exhibits no discharge. No scleral icterus.  Neck: Normal range of motion. Neck supple. No JVD present. No tracheal deviation present. No thyromegaly present.  Cardiovascular: Normal rate, regular rhythm, normal heart sounds and intact distal pulses.   Pulmonary/Chest: Effort normal and breath sounds normal.  Abdominal: Soft. Bowel sounds are normal. She exhibits no distension and no mass. There is no tenderness. There is no rebound and no guarding.  NO Abdominal bruit on EXAM despite ? Aorta scan  Genitourinary:  DEF GYN  Musculoskeletal: Normal range of motion. She exhibits no edema and no tenderness.  Lymphadenopathy:    She has no cervical adenopathy.  Neurological: She is alert and oriented to person, place, and time. She has normal reflexes. No cranial nerve deficit. She exhibits normal muscle tone. Coordination normal.  Skin: Skin is warm and dry. No rash noted. No erythema. No pallor.  Full at [place]: She has a normal mood and affect. Her behavior is normal. Judgment and thought content normal.          Assessment & Plan:  1. CPE- Update screening labs/ History/ Immunizations/ Testing as needed. Advised healthy diet, QD exercise, increase H20 and continue RX/ Vitamins AD.   2. Depression/ Anxiety/ OCD- better controlled continue same.  3. ? Abnormal aorta scan get U/S re-eval  4. Cholesterol- recheck labs, Need to eat healthier and exercise AD.

## 2014-04-26 LAB — URINALYSIS, ROUTINE W REFLEX MICROSCOPIC
Bilirubin Urine: NEGATIVE
Glucose, UA: NEGATIVE mg/dL
Hgb urine dipstick: NEGATIVE
Ketones, ur: NEGATIVE mg/dL
Leukocytes, UA: NEGATIVE
Nitrite: NEGATIVE
Protein, ur: NEGATIVE mg/dL
Specific Gravity, Urine: 1.019 (ref 1.005–1.030)
Urobilinogen, UA: 0.2 mg/dL (ref 0.0–1.0)
pH: 7.5 (ref 5.0–8.0)

## 2014-04-26 LAB — LIPID PANEL
Cholesterol: 188 mg/dL (ref 0–200)
HDL: 80 mg/dL (ref >39)
LDL Cholesterol: 92 mg/dL (ref 0–99)
Total CHOL/HDL Ratio: 2.4 Ratio
Triglycerides: 78 mg/dL (ref <150)
VLDL: 16 mg/dL (ref 0–40)

## 2014-04-26 LAB — INSULIN, FASTING: Insulin fasting, serum: 5 u[IU]/mL (ref 3–28)

## 2014-04-26 LAB — BASIC METABOLIC PANEL WITH GFR
BUN: 18 mg/dL (ref 6–23)
CO2: 31 mEq/L (ref 19–32)
Calcium: 9.4 mg/dL (ref 8.4–10.5)
Chloride: 101 mEq/L (ref 96–112)
Creat: 0.79 mg/dL (ref 0.50–1.10)
GFR, Est African American: >89 mL/min
GFR, Est Non African American: 84 mL/min
Glucose, Bld: 82 mg/dL (ref 70–99)
Potassium: 4.3 mEq/L (ref 3.5–5.3)
Sodium: 141 mEq/L (ref 135–145)

## 2014-04-26 LAB — HEPATIC FUNCTION PANEL
ALT: 13 U/L (ref 0–35)
AST: 18 U/L (ref 0–37)
Albumin: 4.3 g/dL (ref 3.5–5.2)
Alkaline Phosphatase: 38 U/L — ABNORMAL LOW (ref 39–117)
Bilirubin, Direct: 0.1 mg/dL (ref 0.0–0.3)
Indirect Bilirubin: 0.3 mg/dL (ref 0.2–1.2)
Total Bilirubin: 0.4 mg/dL (ref 0.2–1.2)
Total Protein: 6.8 g/dL (ref 6.0–8.3)

## 2014-04-26 LAB — MAGNESIUM: Magnesium: 2 mg/dL (ref 1.5–2.5)

## 2014-04-26 LAB — MICROALBUMIN / CREATININE URINE RATIO
Creatinine, Urine: 102.8 mg/dL
Microalb Creat Ratio: 4.9 mg/g (ref 0.0–30.0)
Microalb, Ur: 0.5 mg/dL (ref 0.00–1.89)

## 2014-04-26 LAB — VITAMIN B12: Vitamin B-12: 852 pg/mL (ref 211–911)

## 2014-04-26 LAB — TSH: TSH: 1.248 u[IU]/mL (ref 0.350–4.500)

## 2014-04-26 LAB — VITAMIN D 25 HYDROXY (VIT D DEFICIENCY, FRACTURES): Vit D, 25-Hydroxy: 47 ng/mL (ref 30–89)

## 2014-04-28 ENCOUNTER — Ambulatory Visit
Admission: RE | Admit: 2014-04-28 | Discharge: 2014-04-28 | Disposition: A | Discharge status: Home / Self Care | Payer: 59 | Source: Ambulatory Visit | Attending: Emergency Medicine | Admitting: Emergency Medicine

## 2014-04-28 DIAGNOSIS — R6889 Other general symptoms and signs: Secondary | ICD-10-CM

## 2014-05-24 ENCOUNTER — Other Ambulatory Visit: Payer: Self-pay | Admitting: Emergency Medicine

## 2014-05-24 NOTE — Progress Notes (Signed)
Patient aware and she will call back with results.

## 2014-05-24 NOTE — Progress Notes (Signed)
Left message on machine at 1:30pm.

## 2014-05-30 ENCOUNTER — Other Ambulatory Visit: Payer: Self-pay | Admitting: Emergency Medicine

## 2014-05-30 DIAGNOSIS — R6889 Other general symptoms and signs: Secondary | ICD-10-CM

## 2014-05-31 ENCOUNTER — Other Ambulatory Visit: Payer: 59

## 2014-05-31 DIAGNOSIS — R6889 Other general symptoms and signs: Secondary | ICD-10-CM

## 2014-05-31 LAB — CBC WITH DIFFERENTIAL/PLATELET
Basophils Absolute: 0.1 10*3/uL (ref 0.0–0.1)
Basophils Relative: 1 % (ref 0–1)
Eosinophils Absolute: 0.1 10*3/uL (ref 0.0–0.7)
Eosinophils Relative: 1 % (ref 0–5)
HCT: 40.3 % (ref 36.0–46.0)
Hemoglobin: 13.6 g/dL (ref 12.0–15.0)
Lymphocytes Relative: 38 % (ref 12–46)
Lymphs Abs: 2.1 10*3/uL (ref 0.7–4.0)
MCH: 32.7 pg (ref 26.0–34.0)
MCHC: 33.7 g/dL (ref 30.0–36.0)
MCV: 96.9 fL (ref 78.0–100.0)
Monocytes Absolute: 0.5 10*3/uL (ref 0.1–1.0)
Monocytes Relative: 9 % (ref 3–12)
Neutro Abs: 2.8 10*3/uL (ref 1.7–7.7)
Neutrophils Relative %: 51 % (ref 43–77)
Platelets: 247 10*3/uL (ref 150–400)
RBC: 4.16 MIL/uL (ref 3.87–5.11)
RDW: 13.8 % (ref 11.5–15.5)
WBC: 5.5 10*3/uL (ref 4.0–10.5)

## 2014-07-15 ENCOUNTER — Other Ambulatory Visit: Payer: Self-pay | Admitting: Emergency Medicine

## 2014-08-29 ENCOUNTER — Ambulatory Visit: Payer: Self-pay | Admitting: Emergency Medicine

## 2014-09-05 ENCOUNTER — Other Ambulatory Visit: Payer: Self-pay | Admitting: Internal Medicine

## 2014-09-05 ENCOUNTER — Other Ambulatory Visit: Payer: Self-pay | Admitting: *Deleted

## 2014-09-05 ENCOUNTER — Ambulatory Visit (INDEPENDENT_AMBULATORY_CARE_PROVIDER_SITE_OTHER): Payer: 59 | Admitting: Internal Medicine

## 2014-09-05 ENCOUNTER — Encounter: Payer: Self-pay | Admitting: Internal Medicine

## 2014-09-05 VITALS — BP 122/72 | HR 56 | Temp 97.7°F | Resp 16 | Ht 62.0 in | Wt 123.2 lb

## 2014-09-05 DIAGNOSIS — R7309 Other abnormal glucose: Secondary | ICD-10-CM

## 2014-09-05 DIAGNOSIS — E785 Hyperlipidemia, unspecified: Secondary | ICD-10-CM

## 2014-09-05 DIAGNOSIS — Z79899 Other long term (current) drug therapy: Secondary | ICD-10-CM | POA: Insufficient documentation

## 2014-09-05 DIAGNOSIS — E559 Vitamin D deficiency, unspecified: Secondary | ICD-10-CM

## 2014-09-05 DIAGNOSIS — R7303 Prediabetes: Secondary | ICD-10-CM | POA: Insufficient documentation

## 2014-09-05 DIAGNOSIS — N951 Menopausal and female climacteric states: Secondary | ICD-10-CM | POA: Insufficient documentation

## 2014-09-05 HISTORY — DX: Prediabetes: R73.03

## 2014-09-05 MED ORDER — PRAVASTATIN SODIUM 40 MG PO TABS: ORAL_TABLET | ORAL | Status: DC

## 2014-09-05 NOTE — Progress Notes (Signed)
Patient ID: Norma Cameron, female   DOB: [birth year], 57 y.o.   MRN: [id number]   This very nice 58 y.o.MWF presents for 3 month follow up with  Hyperlipidemia, Pre-Diabetes and Vitamin D Deficiency.    Today's BP: 122/72 mmHg. Patient has had no complaints of any cardiac type chest pain, palpitations, dyspnea/orthopnea/PND, dizziness, claudication, or dependent edema.   Hyperlipidemia is controlled with diet & meds. Patient was recently started on Pravastatin in Nov 2014 for Chol 233 & LDL of 134 with excellent results. Patient denies myalgias or other med SE's. Last Lipids were  Total Cholesterol 188; HDL 80; LDL 92; Trig 78 on 04/25/2014.   Also, the patient has history of PreDiabetes and has had no symptoms of reactive hypoglycemia, diabetic polys, paresthesias or visual blurring.  Last A1c was  5.8% on 04/25/2014.    Further, the patient also has history of Vitamin D Deficiency of 65 in Mar 2010 and supplements vitamin D without any suspected side-effects. Last vitamin D was  47 on 04/25/2014.   Medication List   ALPRAZolam 0.5 MG tablet  Commonly known as:  XANAX  Take 0.5 mg by mouth 2 (two) times daily.     Biotin 10 MG Tabs  Take by mouth daily.     calcium & magnesium carbonates 311-232 MG per tablet  Commonly known as:  MYLANTA  Take 1 tablet by mouth daily.     co-enzyme Q-10 30 MG capsule  Take 30 mg by mouth 3 (three) times daily.     Cyanocobalamin 1000 MCG Caps  Take 1,000 mcg by mouth daily.     escitalopram 20 MG tablet  Commonly known as:  LEXAPRO  Take 20 mg by mouth daily.     estradiol 1 MG tablet  Commonly known as:  ESTRACE  Take 1 mg by mouth daily.     hydrochlorothiazide 25 MG tablet  Commonly known as:  HYDRODIURIL  Take 25 mg by mouth as needed.     Magnesium 500 MG Caps  Take 500 mg by mouth at bedtime.     pravastatin 40 MG tablet  Commonly known as:  PRAVACHOL  TAKE 1 TABLET BY MOUTH EVERY DAY AT BEDTIME     PROBIOTIC DAILY PO  Take by mouth  daily.     progesterone 100 MG capsule  Commonly known as:  PROMETRIUM  Take 100 mg by mouth. Takes every 90 days for 10 days     Allergies  Allergen Reactions  . Eggs Or Egg-Derived Products Hives  . Neosporin [Neomycin-Bacitracin Zn-Polymyx] Hives  . Red Yeast Rice [Cholestin] Other (See Comments)    MYALGIA with higher doses  . Sulfa Antibiotics Hives  . Zostavax [Zoster Vaccine Live] Rash   PMHx:   Past Medical History  Diagnosis Date  . Hyperlipidemia   . Vitamin D deficiency   . Allergy   . Depression     CONTROLLED WITH OCCASIONAL XANAX  . Osteopenia   . Fibrocystic breast disease     FHx:    Reviewed / unchanged  SHx:    Reviewed / unchanged   Systems Review: In addition to the HPI above,  No Fever-chills,  No Headache, No changes with Vision or hearing,  No problems swallowing food or Liquids,  No Chest pain or productive Cough or Shortness of Breath,  No Abdominal pain, No Nausea or Vomitting, Bowel movements are regular,   No new skin rashes or bruises,  No new joints pains-aches,  No new  weakness, tingling, numbness in any extremity,  No recent weight loss,  No polyuria, polydypsia or polyphagia,  No significant Mental Stressors.  A full 10 point Review of Systems was done, except as stated above, all other Review of Systems were negative  Exam:  BP 122/72  Pulse 56  Temp(Src) 97.7 F (36.5 C) (Temporal)  Resp 16  Ht 5\' 2"  (1.575 m)  Wt 123 lb 3.2 oz (55.883 kg)  BMI 22.53 kg/m2  Appears well nourished and in no distress. Eyes: PERRLA, EOMs, conjunctiva no swelling or erythema. Sinuses: No frontal/maxillary tenderness ENT/Mouth: EAC's clear, TM's nl w/o erythema, bulging. Nares clear w/o erythema, swelling, exudates. Oropharynx clear without erythema or exudates. Oral hygiene is good. Tongue normal, non obstructing. Hearing intact.  Neck: Supple. Thyroid nl. Car 2+/2+ without bruits, nodes or JVD. Chest: Respirations nl with BS clear & equal  w/o rales, rhonchi, wheezing or stridor.  Cor: Heart sounds normal w/ regular rate and rhythm without sig. murmurs, gallops, clicks, or rubs. Peripheral pulses normal and equal  without edema.   Lymphatics: Unremarkable.  Musculoskeletal: Full ROM all peripheral extremities, joint stability, 5/5 strength, and normal gait.  Skin: Warm, dry without exposed rashes, lesions or ecchymosis apparent.  Neuro: Cranial nerves intact, reflexes equal bilaterally. Sensory-motor testing grossly intact. Tendon reflexes grossly intact.  Pysch: Alert & oriented x 3.  Insight and judgement nl & appropriate. No ideations.  Assessment and Plan:   1. Hyperlipidemia - Continue diet/meds, exercise,& lifestyle modifications. Continue monitor periodic cholesterol/liver & renal functions   2. Pre-Diabetes - Continue diet, exercise, lifestyle modifications. Monitor appropriate labs.  3. Vitamin D Deficiency - Continue supplementation.    Recommended regular exercise, BP monitoring, weight control, and discussed med and SE's. Recommended labs to assess and monitor clinical status. Further disposition pending results of labs.

## 2014-09-05 NOTE — Patient Instructions (Signed)
   Recommend the book "The END of DIETING" by Dr Joel Furman   and the book "The END of DIABETES " by Dr Joel Fuhrman  At [employer] - get book & Audio CD's      Being diabetic has a  300% increased risk for heart attack, stroke, cancer, and alzheimer- type vascular dementia. It is very important that you work harder with diet by avoiding all foods that are white except chicken & fish. Avoid white rice ([place] & wild rice is OK), white potatoes (sweetpotatoes in moderation is OK), White bread or wheat bread or anything made out of white flour like bagels, donuts, rolls, buns, biscuits, cakes, pastries, cookies, pizza crust, and pasta (made from white flour & egg whites) - vegetarian pasta or spinach or wheat pasta is OK. Multigrain breads like Arnold's or Pepperidge Farm, or multigrain sandwich thins or flatbreads.  Diet, exercise and weight loss can reverse and cure diabetes in the early stages.  Diet, exercise and weight loss is very important in the control and prevention of complications of diabetes which affects every system in your body, ie. Brain - dementia/stroke, eyes - glaucoma/blindness, heart - heart attack/heart failure, kidneys - dialysis, stomach - gastric paralysis, intestines - malabsorption, nerves - severe painful neuritis, circulation - gangrene & loss of a leg(s), and finally cancer and Alzheimers.    I recommend avoid fried & greasy foods,  sweets/candy, white rice ([place] or wild rice or Quinoa is OK), white potatoes (sweet potatoes are OK) - anything made from white flour - bagels, doughnuts, rolls, buns, biscuits,white and wheat breads, pizza crust and traditional pasta made of white flour & egg white(vegetarian pasta or spinach or wheat pasta is OK).  Multi-grain bread is OK - like multi-grain flat bread or sandwich thins. Avoid alcohol in excess. Exercise is also important.    Eat all the vegetables you want - avoid meat, especially red meat and dairy - especially cheese.  Cheese  is the most concentrated form of trans-fats which is the worst thing to clog up our arteries. Veggie cheese is OK which can be found in the fresh produce section at [employer] or [employer] or [employer]   

## 2014-09-06 LAB — CBC WITH DIFFERENTIAL/PLATELET
Basophils Absolute: 0.1 10*3/uL (ref 0.0–0.1)
Basophils Relative: 1 % (ref 0–1)
Eosinophils Absolute: 0.1 10*3/uL (ref 0.0–0.7)
Eosinophils Relative: 1 % (ref 0–5)
HCT: 39.1 % (ref 36.0–46.0)
Hemoglobin: 13.3 g/dL (ref 12.0–15.0)
Lymphocytes Relative: 36 % (ref 12–46)
Lymphs Abs: 2.6 10*3/uL (ref 0.7–4.0)
MCH: 32 pg (ref 26.0–34.0)
MCHC: 34 g/dL (ref 30.0–36.0)
MCV: 94.2 fL (ref 78.0–100.0)
Monocytes Absolute: 0.4 10*3/uL (ref 0.1–1.0)
Monocytes Relative: 6 % (ref 3–12)
Neutro Abs: 4.1 10*3/uL (ref 1.7–7.7)
Neutrophils Relative %: 56 % (ref 43–77)
Platelets: 245 10*3/uL (ref 150–400)
RBC: 4.15 MIL/uL (ref 3.87–5.11)
RDW: 13.7 % (ref 11.5–15.5)
WBC: 7.3 10*3/uL (ref 4.0–10.5)

## 2014-09-06 LAB — LIPID PANEL
Cholesterol: 205 mg/dL — ABNORMAL HIGH (ref 0–200)
HDL: 79 mg/dL (ref >39)
LDL Cholesterol: 108 mg/dL — ABNORMAL HIGH (ref 0–99)
Total CHOL/HDL Ratio: 2.6 Ratio
Triglycerides: 89 mg/dL (ref <150)
VLDL: 18 mg/dL (ref 0–40)

## 2014-09-06 LAB — HEPATIC FUNCTION PANEL
ALT: 12 U/L (ref 0–35)
AST: 19 U/L (ref 0–37)
Albumin: 4.4 g/dL (ref 3.5–5.2)
Alkaline Phosphatase: 41 U/L (ref 39–117)
Bilirubin, Direct: 0.1 mg/dL (ref 0.0–0.3)
Indirect Bilirubin: 0.4 mg/dL (ref 0.2–1.2)
Total Bilirubin: 0.5 mg/dL (ref 0.2–1.2)
Total Protein: 6.8 g/dL (ref 6.0–8.3)

## 2014-09-06 LAB — BASIC METABOLIC PANEL WITH GFR
BUN: 18 mg/dL (ref 6–23)
CO2: 31 mEq/L (ref 19–32)
Calcium: 9.5 mg/dL (ref 8.4–10.5)
Chloride: 101 mEq/L (ref 96–112)
Creat: 0.79 mg/dL (ref 0.50–1.10)
GFR, Est African American: >89 mL/min
GFR, Est Non African American: 83 mL/min
Glucose, Bld: 87 mg/dL (ref 70–99)
Potassium: 4.2 mEq/L (ref 3.5–5.3)
Sodium: 140 mEq/L (ref 135–145)

## 2014-09-06 LAB — TSH: TSH: 1.23 u[IU]/mL (ref 0.350–4.500)

## 2014-09-06 LAB — HEMOGLOBIN A1C
Hgb A1c MFr Bld: 5.7 % — ABNORMAL HIGH (ref <5.7)
Mean Plasma Glucose: 117 mg/dL — ABNORMAL HIGH (ref <117)

## 2014-09-06 LAB — MAGNESIUM: Magnesium: 2.1 mg/dL (ref 1.5–2.5)

## 2014-09-06 LAB — VITAMIN D 25 HYDROXY (VIT D DEFICIENCY, FRACTURES): Vit D, 25-Hydroxy: 52 ng/mL (ref 30–89)

## 2014-09-06 LAB — INSULIN, FASTING: Insulin fasting, serum: 3.7 u[IU]/mL (ref 2.0–19.6)

## 2014-10-25 ENCOUNTER — Other Ambulatory Visit: Payer: Self-pay | Admitting: Obstetrics & Gynecology

## 2014-10-25 DIAGNOSIS — R928 Other abnormal and inconclusive findings on diagnostic imaging of breast: Secondary | ICD-10-CM

## 2014-10-30 ENCOUNTER — Ambulatory Visit
Admission: RE | Admit: 2014-10-30 | Discharge: 2014-10-30 | Disposition: A | Discharge status: Home / Self Care | Payer: 59 | Source: Ambulatory Visit | Attending: Obstetrics & Gynecology | Admitting: Obstetrics & Gynecology

## 2014-10-30 ENCOUNTER — Other Ambulatory Visit: Payer: Self-pay | Admitting: Obstetrics & Gynecology

## 2014-10-30 DIAGNOSIS — N6002 Solitary cyst of left breast: Secondary | ICD-10-CM

## 2014-10-30 DIAGNOSIS — R928 Other abnormal and inconclusive findings on diagnostic imaging of breast: Secondary | ICD-10-CM

## 2014-12-22 ENCOUNTER — Encounter: Payer: Self-pay | Admitting: Physician Assistant

## 2014-12-22 ENCOUNTER — Other Ambulatory Visit: Payer: Self-pay

## 2014-12-22 ENCOUNTER — Ambulatory Visit (INDEPENDENT_AMBULATORY_CARE_PROVIDER_SITE_OTHER): Payer: 59 | Admitting: Physician Assistant

## 2014-12-22 VITALS — BP 120/70 | HR 64 | Temp 97.9°F | Resp 16 | Ht 62.0 in | Wt 129.0 lb

## 2014-12-22 DIAGNOSIS — Z79899 Other long term (current) drug therapy: Secondary | ICD-10-CM

## 2014-12-22 DIAGNOSIS — F329 Major depressive disorder, single episode, unspecified: Secondary | ICD-10-CM

## 2014-12-22 DIAGNOSIS — F32A Depression, unspecified: Secondary | ICD-10-CM

## 2014-12-22 DIAGNOSIS — E559 Vitamin D deficiency, unspecified: Secondary | ICD-10-CM

## 2014-12-22 DIAGNOSIS — R7303 Prediabetes: Secondary | ICD-10-CM

## 2014-12-22 DIAGNOSIS — E785 Hyperlipidemia, unspecified: Secondary | ICD-10-CM

## 2014-12-22 DIAGNOSIS — R7309 Other abnormal glucose: Secondary | ICD-10-CM

## 2014-12-22 LAB — CBC WITH DIFFERENTIAL/PLATELET
Basophils Absolute: 0.1 10*3/uL (ref 0.0–0.1)
Basophils Relative: 1 % (ref 0–1)
Eosinophils Absolute: 0.2 10*3/uL (ref 0.0–0.7)
Eosinophils Relative: 2 % (ref 0–5)
HCT: 39.9 % (ref 36.0–46.0)
Hemoglobin: 13.2 g/dL (ref 12.0–15.0)
Lymphocytes Relative: 31 % (ref 12–46)
Lymphs Abs: 2.6 10*3/uL (ref 0.7–4.0)
MCH: 32.1 pg (ref 26.0–34.0)
MCHC: 33.1 g/dL (ref 30.0–36.0)
MCV: 97.1 fL (ref 78.0–100.0)
MPV: 9.8 fL (ref 8.6–12.4)
Monocytes Absolute: 0.6 10*3/uL (ref 0.1–1.0)
Monocytes Relative: 7 % (ref 3–12)
Neutro Abs: 5 10*3/uL (ref 1.7–7.7)
Neutrophils Relative %: 59 % (ref 43–77)
Platelets: 375 10*3/uL (ref 150–400)
RBC: 4.11 MIL/uL (ref 3.87–5.11)
RDW: 13.5 % (ref 11.5–15.5)
WBC: 8.4 10*3/uL (ref 4.0–10.5)

## 2014-12-22 LAB — HEPATIC FUNCTION PANEL
ALT: 9 U/L (ref 0–35)
AST: 16 U/L (ref 0–37)
Albumin: 4 g/dL (ref 3.5–5.2)
Alkaline Phosphatase: 50 U/L (ref 39–117)
Bilirubin, Direct: 0.1 mg/dL (ref 0.0–0.3)
Indirect Bilirubin: 0.3 mg/dL (ref 0.2–1.2)
Total Bilirubin: 0.4 mg/dL (ref 0.2–1.2)
Total Protein: 7.2 g/dL (ref 6.0–8.3)

## 2014-12-22 LAB — HEMOGLOBIN A1C
Hgb A1c MFr Bld: 5.7 % — ABNORMAL HIGH (ref <5.7)
Mean Plasma Glucose: 117 mg/dL — ABNORMAL HIGH (ref <117)

## 2014-12-22 LAB — BASIC METABOLIC PANEL WITH GFR
BUN: 15 mg/dL (ref 6–23)
CO2: 30 mEq/L (ref 19–32)
Calcium: 9.6 mg/dL (ref 8.4–10.5)
Chloride: 100 mEq/L (ref 96–112)
Creat: 0.79 mg/dL (ref 0.50–1.10)
GFR, Est African American: >89 mL/min
GFR, Est Non African American: 83 mL/min
Glucose, Bld: 84 mg/dL (ref 70–99)
Potassium: 4.4 mEq/L (ref 3.5–5.3)
Sodium: 138 mEq/L (ref 135–145)

## 2014-12-22 LAB — LIPID PANEL
Cholesterol: 212 mg/dL — ABNORMAL HIGH (ref 0–200)
HDL: 64 mg/dL (ref >39)
LDL Cholesterol: 132 mg/dL — ABNORMAL HIGH (ref 0–99)
Total CHOL/HDL Ratio: 3.3 Ratio
Triglycerides: 82 mg/dL (ref <150)
VLDL: 16 mg/dL (ref 0–40)

## 2014-12-22 LAB — TSH: TSH: 0.828 u[IU]/mL (ref 0.350–4.500)

## 2014-12-22 LAB — MAGNESIUM: Magnesium: 2.1 mg/dL (ref 1.5–2.5)

## 2014-12-22 MED ORDER — PRAVASTATIN SODIUM 40 MG PO TABS: ORAL_TABLET | ORAL | Status: DC

## 2014-12-22 NOTE — Patient Instructions (Signed)
Your LDL is not in range. Your LDL is the bad cholesterol that can lead to heart attack and stroke. To lower your number you can decrease your fatty foods, red meat, cheese, milk and increase fiber like whole grains and veggies. You can also add a fiber supplement like Metamucil or Benefiber.   Benefiber is good for constipation/diarrhea/irritable bowel syndrome, it helps with weight loss and can help lower your bad cholesterol. Please do 1-2 TBSP in the morning in water, coffee, or tea. It can take up to a month before you can see a difference with your bowel movements. It is cheapest from [employer], [employer], [employer].

## 2014-12-22 NOTE — Progress Notes (Signed)
Assessment and Plan:  Hypertension: Continue medication, monitor blood pressure at home. Continue DASH diet.  Reminder to go to the ER if any CP, SOB, nausea, dizziness, severe HA, changes vision/speech, left arm numbness and tingling, and jaw pain. Cholesterol: Continue diet and exercise. Check cholesterol.  Pre-diabetes-Continue diet and exercise. Check A1C Vitamin D Def- check level and continue medications.   Continue diet and meds as discussed. Further disposition pending results of labs. Future Appointments Date Time Provider [place]  04/30/2015 2:00 PM Melissa R Tamala Julian, PA-C [id number] None    HPI 59 y.o. female  presents for 3 month follow up with hypertension, hyperlipidemia, prediabetes and vitamin D. Her blood pressure has been controlled at home, today their BP is BP: 120/70 mmHg She does workout. She denies chest pain, shortness of breath, dizziness.  She is on cholesterol medication, has been off due to mix up at the pharmacy, pravastatin 40 and denies myalgias. Her cholesterol is at goal. The cholesterol last visit was:   Lab Results  Component Value Date   CHOL 205* 09/05/2014   HDL 79 09/05/2014   LDLCALC 108* 09/05/2014   TRIG 89 09/05/2014   CHOLHDL 2.6 09/05/2014  She has been working on diet and exercise for prediabetes, and denies paresthesia of the feet, polydipsia, polyuria and visual disturbances. Last A1C in the office was:  Lab Results  Component Value Date   HGBA1C 5.7* 09/05/2014  Patient is on Vitamin D supplement.   Lab Results  Component Value Date   VD25OH 56 09/05/2014  She is concerned about her weight. She follows with Dr. Milta Deiters for her hormone replacement, she is on estradiol 1mg .  Lab Results  Component Value Date   TSH 1.230 09/05/2014       Current Medications:  Current Outpatient Prescriptions on File Prior to Visit  Medication Sig Dispense Refill  . ALPRAZolam (XANAX) 0.5 MG tablet Take 0.5 mg by mouth 2 (two) times daily.     . Biotin 10 MG TABS Take by mouth daily.    . calcium & magnesium carbonates (MYLANTA) 311-232 MG per tablet Take 1 tablet by mouth daily.    Marland Kitchen co-enzyme Q-10 30 MG capsule Take 30 mg by mouth 3 (three) times daily.    . Cyanocobalamin 1000 MCG CAPS Take 1,000 mcg by mouth daily.    Marland Kitchen escitalopram (LEXAPRO) 20 MG tablet Take 20 mg by mouth daily.    Marland Kitchen estradiol (ESTRACE) 1 MG tablet Take 1 mg by mouth daily.    . hydrochlorothiazide (HYDRODIURIL) 25 MG tablet Take 25 mg by mouth as needed.     . Magnesium 500 MG CAPS Take 500 mg by mouth at bedtime.    . Probiotic Product (PROBIOTIC DAILY PO) Take by mouth daily.    . progesterone (PROMETRIUM) 100 MG capsule Take 100 mg by mouth. Takes every 90 days for 10 days     No current facility-administered medications on file prior to visit.   Medical History:  Past Medical History  Diagnosis Date  . Hyperlipidemia   . Vitamin D deficiency   . Allergy   . Depression     CONTROLLED WITH OCCASIONAL XANAX  . Osteopenia   . Fibrocystic breast disease    Allergies:  Allergies  Allergen Reactions  . Eggs Or Egg-Derived Products Hives  . Neosporin [Neomycin-Bacitracin Zn-Polymyx] Hives  . Red Yeast Rice [Cholestin] Other (See Comments)    MYALGIA with higher doses  . Sulfa Antibiotics Hives  . Zostavax [employer]  Vaccine Live] Rash     Review of Systems:  Review of Systems  Constitutional: Negative.   HENT: Negative.   Eyes: Negative.   Respiratory: Negative.   Cardiovascular: Negative.   Gastrointestinal: Negative.   Genitourinary: Negative.   Musculoskeletal: Negative.   Skin: Negative.   Neurological: Negative.   Endo/Heme/Allergies: Negative.   Psychiatric/Behavioral: Negative.      Family history- Review and unchanged Social history- Review and unchanged Physical Exam: BP 120/70 mmHg  Pulse 64  Temp(Src) 97.9 F (36.6 C)  Resp 16  Ht 5\' 2"  (1.575 m)  Wt 129 lb (58.514 kg)  BMI 23.59 kg/m2 Wt Readings from Last 3  Encounters:  12/22/14 129 lb (58.514 kg)  09/05/14 123 lb 3.2 oz (55.883 kg)  04/25/14 121 lb (54.885 kg)   General Appearance: Well nourished, in no apparent distress. Eyes: PERRLA, EOMs, conjunctiva no swelling or erythema Sinuses: No Frontal/maxillary tenderness ENT/Mouth: Ext aud canals clear, TMs without erythema, bulging. No erythema, swelling, or exudate on post pharynx.  Tonsils not swollen or erythematous. Hearing normal.  Neck: Supple, thyroid normal.  Respiratory: Respiratory effort normal, BS equal bilaterally without rales, rhonchi, wheezing or stridor.  Cardio: RRR with no MRGs. Brisk peripheral pulses without edema.  Abdomen: Soft, + BS.  Non tender, no guarding, rebound, hernias, masses. Lymphatics: Non tender without lymphadenopathy.  Musculoskeletal: Full ROM, 5/5 strength, normal gait.  Skin: Warm, dry without rashes, lesions, ecchymosis.  Neuro: Cranial nerves intact. Normal muscle tone, no cerebellar symptoms. Sensation intact.  Psych: Awake and oriented X 3, normal affect, Insight and Judgment appropriate.    Vicie Mutters, PA-C 9:35 AM [facility]

## 2014-12-23 LAB — VITAMIN D 25 HYDROXY (VIT D DEFICIENCY, FRACTURES): Vit D, 25-Hydroxy: 42 ng/mL (ref 30–100)

## 2015-03-14 ENCOUNTER — Ambulatory Visit (INDEPENDENT_AMBULATORY_CARE_PROVIDER_SITE_OTHER): Payer: 59 | Admitting: Internal Medicine

## 2015-03-14 ENCOUNTER — Encounter: Payer: Self-pay | Admitting: Internal Medicine

## 2015-03-14 VITALS — BP 118/82 | HR 74 | Temp 98.2°F | Resp 18 | Ht 62.0 in | Wt 130.0 lb

## 2015-03-14 DIAGNOSIS — J069 Acute upper respiratory infection, unspecified: Secondary | ICD-10-CM

## 2015-03-14 MED ORDER — DEXAMETHASONE 0.5 MG PO TABS: ORAL_TABLET | ORAL | Status: DC

## 2015-03-14 MED ORDER — MOMETASONE FUROATE 50 MCG/ACT NA SUSP: 2.0000 | Freq: Every day | NASAL | Status: DC

## 2015-03-14 MED ORDER — BENZONATATE 100 MG PO CAPS: 100.0000 mg | ORAL_CAPSULE | Freq: Four times a day (QID) | ORAL | Status: DC | PRN

## 2015-03-14 MED ORDER — AZITHROMYCIN 250 MG PO TABS: ORAL_TABLET | ORAL | Status: DC

## 2015-03-14 NOTE — Patient Instructions (Signed)
Upper Respiratory Infection, Adult An upper respiratory infection (URI) is also sometimes known as the common cold. The upper respiratory tract includes the nose, sinuses, throat, trachea, and bronchi. Bronchi are the airways leading to the lungs. Most people improve within 1 week, but symptoms can last up to 2 weeks. A residual cough may last even longer.  CAUSES Many different viruses can infect the tissues lining the upper respiratory tract. The tissues become irritated and inflamed and often become very moist. Mucus production is also common. A cold is contagious. You can easily spread the virus to others by oral contact. This includes kissing, sharing a glass, coughing, or sneezing. Touching your mouth or nose and then touching a surface, which is then touched by another person, can also spread the virus. SYMPTOMS  Symptoms typically develop 1 to 3 days after you come in contact with a cold virus. Symptoms vary from person to person. They may include:  Runny nose.  Sneezing.  Nasal congestion.  Sinus irritation.  Sore throat.  Loss of voice (laryngitis).  Cough.  Fatigue.  Muscle aches.  Loss of appetite.  Headache.  Low-grade fever. DIAGNOSIS  You might diagnose your own cold based on familiar symptoms, since most people get a cold 2 to 3 times a year. Your caregiver can confirm this based on your exam. Most importantly, your caregiver can check that your symptoms are not due to another disease such as strep throat, sinusitis, pneumonia, asthma, or epiglottitis. Blood tests, throat tests, and X-rays are not necessary to diagnose a common cold, but they may sometimes be helpful in excluding other more serious diseases. Your caregiver will decide if any further tests are required. RISKS AND COMPLICATIONS  You may be at risk for a more severe case of the common cold if you smoke cigarettes, have chronic heart disease (such as heart failure) or lung disease (such as asthma), or if  you have a weakened immune system. The very young and very old are also at risk for more serious infections. Bacterial sinusitis, middle ear infections, and bacterial pneumonia can complicate the common cold. The common cold can worsen asthma and chronic obstructive pulmonary disease (COPD). Sometimes, these complications can require emergency medical care and may be life-threatening. PREVENTION  The best way to protect against getting a cold is to practice good hygiene. Avoid oral or hand contact with people with cold symptoms. Wash your hands often if contact occurs. There is no clear evidence that vitamin C, vitamin E, echinacea, or exercise reduces the chance of developing a cold. However, it is always recommended to get plenty of rest and practice good nutrition. TREATMENT  Treatment is directed at relieving symptoms. There is no cure. Antibiotics are not effective, because the infection is caused by a virus, not by bacteria. Treatment may include:  Increased fluid intake. Sports drinks offer valuable electrolytes, sugars, and fluids.  Breathing heated mist or steam (vaporizer or shower).  Eating chicken soup or other clear broths, and maintaining good nutrition.  Getting plenty of rest.  Using gargles or lozenges for comfort.  Controlling fevers with ibuprofen or acetaminophen as directed by your caregiver.  Increasing usage of your inhaler if you have asthma. Zinc gel and zinc lozenges, taken in the first 24 hours of the common cold, can shorten the duration and lessen the severity of symptoms. Pain medicines may help with fever, muscle aches, and throat pain. A variety of non-prescription medicines are available to treat congestion and runny nose. Your caregiver   can make recommendations and may suggest nasal or lung inhalers for other symptoms.  HOME CARE INSTRUCTIONS   Only take over-the-counter or prescription medicines for pain, discomfort, or fever as directed by your  caregiver.  Use a warm mist humidifier or inhale steam from a shower to increase air moisture. This may keep secretions moist and make it easier to breathe.  Drink enough water and fluids to keep your urine clear or pale yellow.  Rest as needed.  Return to work when your temperature has returned to normal or as your caregiver advises. You may need to stay home longer to avoid infecting others. You can also use a face mask and careful hand washing to prevent spread of the virus. SEEK MEDICAL CARE IF:   After the first few days, you feel you are getting worse rather than better.  You need your caregiver's advice about medicines to control symptoms.  You develop chills, worsening shortness of breath, or [place] or red sputum. These may be signs of pneumonia.  You develop yellow or [place] nasal discharge or pain in the face, especially when you bend forward. These may be signs of sinusitis.  You develop a fever, swollen neck glands, pain with swallowing, or white areas in the back of your throat. These may be signs of strep throat. SEEK IMMEDIATE MEDICAL CARE IF:   You have a fever.  You develop severe or persistent headache, ear pain, sinus pain, or chest pain.  You develop wheezing, a prolonged cough, cough up blood, or have a change in your usual mucus (if you have chronic lung disease).  You develop sore muscles or a stiff neck. Document Released: 05/20/2001 Document Revised: 02/16/2012 Document Reviewed: 03/01/2014 Exit[employer] Patient Information 2015 [employer]. This information is not intended to replace advice given to you by your health care provider. Make sure you discuss any questions you have with your health care provider.  

## 2015-03-14 NOTE — Progress Notes (Signed)
   Subjective:    Patient ID: Norma Cameron, female    DOB: January 01, 1956, 58 y.o.   MRN: [id number]  Sinus Problem Associated symptoms include congestion, coughing, ear pain and sinus pressure. Pertinent negatives include no chills, shortness of breath or sore throat.  Patient reports that she has had sinus congestion and upper respiratory symptoms since Sunday. She reports that she has had sinus congestion, sinus headache, teeth pain, ear pain, and chest pain only with coughing.  She reports that she has some chest congestion as well. She has been trying tylenol, ibuprofen, and some OTC sinus products with no relief.  She does report history of seasonal allergies.  She reports that they haven't been too bad this year.      Review of Systems  Constitutional: Negative for fever, chills and fatigue.  HENT: Positive for congestion, dental problem, ear pain, postnasal drip, rhinorrhea, sinus pressure and voice change. Negative for ear discharge, nosebleeds and sore throat.   Respiratory: Positive for cough. Negative for chest tightness, shortness of breath and wheezing.   Cardiovascular: Positive for chest pain (only with coughing).  Gastrointestinal: Negative for nausea, vomiting, abdominal pain, diarrhea and constipation.  Neurological: Negative for dizziness and light-headedness.       Objective:   Physical Exam  Constitutional: She is oriented to person, place, and time. She appears well-developed and well-nourished. No distress.  HENT:  Head: Normocephalic and atraumatic.  Nose: Mucosal edema present. Right sinus exhibits maxillary sinus tenderness and frontal sinus tenderness. Left sinus exhibits maxillary sinus tenderness and frontal sinus tenderness.  Mouth/Throat: Uvula is midline, oropharynx is clear and moist and mucous membranes are normal. No trismus in the jaw. No oropharyngeal exudate.  Eyes: Conjunctivae and EOM are normal. Pupils are equal, round, and reactive to light. No scleral  icterus.  Neck: Normal range of motion. Neck supple. No JVD present. No thyromegaly present.  Cardiovascular: Normal rate, regular rhythm, normal heart sounds and intact distal pulses.  Exam reveals no gallop and no friction rub.   No murmur heard. Pulmonary/Chest: Effort normal and breath sounds normal. No respiratory distress. She has no wheezes. She has no rales. She exhibits tenderness.  Musculoskeletal: Normal range of motion.  Lymphadenopathy:    She has no cervical adenopathy.  Neurological: She is alert and oriented to person, place, and time.  Skin: Skin is warm and dry. She is not diaphoretic.  Psychiatric: She has a normal mood and affect. Her behavior is normal. Judgment and thought content normal.  Nursing note and vitals reviewed.         Assessment & Plan:    1. Acute URI Likely viral vs. Allergic rhinitis.  Cont antihistamine daily, nasal saline, mucinex.  Hold Zpak for 2-3 days.    - dexamethasone (DECADRON) 0.5 MG tablet; Day 1 take 2 tablets, Days 2-5 take 1 tablet  Dispense: 6 tablet; Refill: 0 - mometasone (NASONEX) 50 MCG/ACT nasal spray; Place 2 sprays into the nose daily.  Dispense: 17 g; Refill: 2 - benzonatate (TESSALON PERLES) 100 MG capsule; Take 1 capsule (100 mg total) by mouth every 6 (six) hours as needed for cough.  Dispense: 30 capsule; Refill: 1 - azithromycin (ZITHROMAX Z-PAK) 250 MG tablet; 2 po day one, then 1 daily x 4 days  Dispense: 5 tablet; Refill: 0

## 2015-04-11 ENCOUNTER — Other Ambulatory Visit: Payer: Self-pay | Admitting: Internal Medicine

## 2015-04-11 MED ORDER — AZITHROMYCIN 250 MG PO TABS: ORAL_TABLET | ORAL | Status: AC

## 2015-04-11 MED ORDER — PREDNISONE 20 MG PO TABS: ORAL_TABLET | ORAL | Status: DC

## 2015-04-11 NOTE — Progress Notes (Signed)
Left message on machine at 11:37am.

## 2015-04-11 NOTE — Progress Notes (Signed)
Left message on machine at 3:42pm.

## 2015-04-30 ENCOUNTER — Ambulatory Visit (INDEPENDENT_AMBULATORY_CARE_PROVIDER_SITE_OTHER): Payer: 59 | Admitting: Internal Medicine

## 2015-04-30 ENCOUNTER — Encounter: Payer: Self-pay | Admitting: Internal Medicine

## 2015-04-30 VITALS — BP 126/78 | HR 62 | Temp 98.0°F | Resp 16 | Ht 62.0 in | Wt 130.0 lb

## 2015-04-30 DIAGNOSIS — IMO0001 Reserved for inherently not codable concepts without codable children: Secondary | ICD-10-CM

## 2015-04-30 DIAGNOSIS — R7303 Prediabetes: Secondary | ICD-10-CM

## 2015-04-30 DIAGNOSIS — Z1212 Encounter for screening for malignant neoplasm of rectum: Secondary | ICD-10-CM

## 2015-04-30 DIAGNOSIS — Z79899 Other long term (current) drug therapy: Secondary | ICD-10-CM

## 2015-04-30 DIAGNOSIS — R6889 Other general symptoms and signs: Secondary | ICD-10-CM

## 2015-04-30 DIAGNOSIS — E785 Hyperlipidemia, unspecified: Secondary | ICD-10-CM

## 2015-04-30 DIAGNOSIS — R5383 Other fatigue: Secondary | ICD-10-CM

## 2015-04-30 DIAGNOSIS — R030 Elevated blood-pressure reading, without diagnosis of hypertension: Secondary | ICD-10-CM

## 2015-04-30 DIAGNOSIS — Z0001 Encounter for general adult medical examination with abnormal findings: Secondary | ICD-10-CM

## 2015-04-30 DIAGNOSIS — Z1389 Encounter for screening for other disorder: Secondary | ICD-10-CM

## 2015-04-30 DIAGNOSIS — Z1329 Encounter for screening for other suspected endocrine disorder: Secondary | ICD-10-CM

## 2015-04-30 DIAGNOSIS — E559 Vitamin D deficiency, unspecified: Secondary | ICD-10-CM

## 2015-04-30 DIAGNOSIS — R7309 Other abnormal glucose: Secondary | ICD-10-CM

## 2015-04-30 DIAGNOSIS — Z139 Encounter for screening, unspecified: Secondary | ICD-10-CM

## 2015-04-30 LAB — VITAMIN B12: Vitamin B-12: 964 pg/mL — ABNORMAL HIGH (ref 211–911)

## 2015-04-30 LAB — CBC WITH DIFFERENTIAL/PLATELET
Basophils Absolute: 0.1 10*3/uL (ref 0.0–0.1)
Basophils Relative: 1 % (ref 0–1)
Eosinophils Absolute: 0.1 10*3/uL (ref 0.0–0.7)
Eosinophils Relative: 1 % (ref 0–5)
HCT: 40.7 % (ref 36.0–46.0)
Hemoglobin: 13.7 g/dL (ref 12.0–15.0)
Lymphocytes Relative: 38 % (ref 12–46)
Lymphs Abs: 2.2 10*3/uL (ref 0.7–4.0)
MCH: 32 pg (ref 26.0–34.0)
MCHC: 33.7 g/dL (ref 30.0–36.0)
MCV: 95.1 fL (ref 78.0–100.0)
MPV: 10.6 fL (ref 8.6–12.4)
Monocytes Absolute: 0.4 10*3/uL (ref 0.1–1.0)
Monocytes Relative: 7 % (ref 3–12)
Neutro Abs: 3 10*3/uL (ref 1.7–7.7)
Neutrophils Relative %: 53 % (ref 43–77)
Platelets: 277 10*3/uL (ref 150–400)
RBC: 4.28 MIL/uL (ref 3.87–5.11)
RDW: 14.3 % (ref 11.5–15.5)
WBC: 5.7 10*3/uL (ref 4.0–10.5)

## 2015-04-30 LAB — BASIC METABOLIC PANEL WITH GFR
BUN: 15 mg/dL (ref 6–23)
CO2: 31 mEq/L (ref 19–32)
Calcium: 9.5 mg/dL (ref 8.4–10.5)
Chloride: 99 mEq/L (ref 96–112)
Creat: 0.81 mg/dL (ref 0.50–1.10)
GFR, Est African American: >89 mL/min
GFR, Est Non African American: 81 mL/min
Glucose, Bld: 91 mg/dL (ref 70–99)
Potassium: 4.2 mEq/L (ref 3.5–5.3)
Sodium: 139 mEq/L (ref 135–145)

## 2015-04-30 LAB — IRON AND TIBC
%SAT: 25 % (ref 20–55)
Iron: 96 ug/dL (ref 42–145)
TIBC: 377 ug/dL (ref 250–470)
UIBC: 281 ug/dL (ref 125–400)

## 2015-04-30 LAB — HEPATIC FUNCTION PANEL
ALT: 13 U/L (ref 0–35)
AST: 16 U/L (ref 0–37)
Albumin: 4.4 g/dL (ref 3.5–5.2)
Alkaline Phosphatase: 40 U/L (ref 39–117)
Bilirubin, Direct: 0.1 mg/dL (ref 0.0–0.3)
Indirect Bilirubin: 0.4 mg/dL (ref 0.2–1.2)
Total Bilirubin: 0.5 mg/dL (ref 0.2–1.2)
Total Protein: 6.7 g/dL (ref 6.0–8.3)

## 2015-04-30 LAB — LIPID PANEL
Cholesterol: 229 mg/dL — ABNORMAL HIGH (ref 0–200)
HDL: 71 mg/dL (ref >=46)
LDL Cholesterol: 135 mg/dL — ABNORMAL HIGH (ref 0–99)
Total CHOL/HDL Ratio: 3.2 Ratio
Triglycerides: 114 mg/dL (ref <150)
VLDL: 23 mg/dL (ref 0–40)

## 2015-04-30 LAB — TSH: TSH: 0.884 u[IU]/mL (ref 0.350–4.500)

## 2015-04-30 LAB — MAGNESIUM: Magnesium: 2 mg/dL (ref 1.5–2.5)

## 2015-04-30 LAB — HEMOGLOBIN A1C
Hgb A1c MFr Bld: 5.8 % — ABNORMAL HIGH (ref <5.7)
Mean Plasma Glucose: 120 mg/dL — ABNORMAL HIGH (ref <117)

## 2015-04-30 MED ORDER — HYDROCHLOROTHIAZIDE 25 MG PO TABS: 25.0000 mg | ORAL_TABLET | ORAL | Status: DC | PRN

## 2015-04-30 NOTE — Progress Notes (Signed)
Patient ID: Norma Cameron, female   DOB: May 29, 1956, 59 y.o.   MRN: [id number]  Complete Physical  Assessment and Plan:   1. Prediabetes  - Hemoglobin A1c - Insulin, random  2. Hyperlipidemia  - Lipid panel  3. Vitamin D deficiency  - Vit D  25 hydroxy (rtn osteoporosis monitoring)  4. Medication management  - CBC with Differential/Platelet - BASIC METABOLIC PANEL WITH GFR - Hepatic function panel - Magnesium  5. Screening for rectal cancer  - POC Hemoccult Bld/Stl (3-Cd Home Screen); Future  6. Screening for hypothyroidism  - TSH  7. Screening for hematuria or proteinuria   8. Elevated BP  - Urinalysis, Routine w reflex microscopic - Microalbumin / creatinine urine ratio - EKG 12-Lead - [place], RETROPERITNL ABD,  LTD  9. Other fatigue  - Iron and TIBC - Vitamin B12    Discussed med's effects and SE's. Screening labs and tests as requested with regular follow-up as recommended.  HPI  59 y.o. female  presents for a complete physical.  Her blood pressure has been controlled at home, today their BP is BP: 126/78 mmHg.  She does workout. She does spin classes, biking, and barre classes.   She denies chest pain, shortness of breath, dizziness.   She is on cholesterol medication and denies myalgias. Her cholesterol is not at goal. The cholesterol last visit was:  Lab Results  Component Value Date   CHOL 212* 12/22/2014   HDL 64 12/22/2014   LDLCALC 132* 12/22/2014   TRIG 82 12/22/2014   CHOLHDL 3.3 12/22/2014  .  She has been working on diet and exercise for prediabetes, she is on bASA, and denies foot ulcerations, hyperglycemia, hypoglycemia , increased appetite, nausea, paresthesia of the feet, polydipsia, polyuria, visual disturbances, vomiting and weight loss. Last A1C in the office was:  Lab Results  Component Value Date   HGBA1C 5.7* 12/22/2014  Typical breakfast or lunch is greek yogurt or a belvida cracker pack.  Mid morning she does string cheese  and almonds, and lunch is usually salad.    Patient is on Vitamin D supplement.   Lab Results  Component Value Date   VD25OH 42 12/22/2014     Patient reports that twice she has had two random bouts of nausea and vomited once.  She reports that she has had a tremendous amount of stress at work.    She reports that her job is layoffs today.  Current Medications:  Current Outpatient Prescriptions on File Prior to Visit  Medication Sig Dispense Refill  . ALPRAZolam (XANAX) 0.5 MG tablet Take 0.5 mg by mouth 2 (two) times daily.    . Biotin 10 MG TABS Take by mouth daily.    . calcium & magnesium carbonates (MYLANTA) 311-232 MG per tablet Take 1 tablet by mouth daily.    Marland Kitchen co-enzyme Q-10 30 MG capsule Take 30 mg by mouth 3 (three) times daily.    Marland Kitchen escitalopram (LEXAPRO) 20 MG tablet Take 20 mg by mouth daily.    . hydrochlorothiazide (HYDRODIURIL) 25 MG tablet Take 25 mg by mouth as needed.     . Magnesium 500 MG CAPS Take 500 mg by mouth at bedtime.    . mometasone (NASONEX) 50 MCG/ACT nasal spray Place 2 sprays into the nose daily. 17 g 2  . pravastatin (PRAVACHOL) 40 MG tablet TAKE 1 TABLET BY MOUTH EVERY DAY AT BEDTIME 90 tablet 3  . Probiotic Product (PROBIOTIC DAILY PO) Take by mouth daily.  No current facility-administered medications on file prior to visit.    Health Maintenance:   Immunization History  Administered Date(s) Administered  . Tdap 02/04/2008  . Zoster 12/28/2012    Pap: June 2015 [id number] 2015 DEXA: ordered by OBGyn Colonoscopy: 2014   Patient Care Team: Unk Pinto, MD as PCP - General (Internal Medicine) Macarthur Critchley, OD as Referring Physician (Optometry) Richmond Campbell, MD as Consulting Physician (Gastroenterology) Viona Gilmore Evette Cristal, MD as Consulting Physician (Obstetrics and Gynecology) Fayette Pho, MD as Referring Physician (Orthopedic Surgery) Lavonna Monarch, MD as Consulting Physician (Dermatology)  Allergies:  Allergies  Allergen  Reactions  . Eggs Or Egg-Derived Products Hives  . Neosporin [Neomycin-Bacitracin Zn-Polymyx] Hives  . Red Yeast Rice [Cholestin] Other (See Comments)    MYALGIA with higher doses  . Sulfa Antibiotics Hives  . Zostavax [Zoster Vaccine Live] Rash    Medical History:  Past Medical History  Diagnosis Date  . Hyperlipidemia   . Vitamin D deficiency   . Allergy   . Depression     CONTROLLED WITH OCCASIONAL XANAX  . Osteopenia   . Fibrocystic breast disease     Surgical History:  Past Surgical History  Procedure Laterality Date  . Dilation and curettage of uterus  2006  . Novasure ablation  2006  . Cervix lesion destruction      EARLY 20'S  . Skin cancer excision Right 10/2011    LOWER EXT BCC    Family History:  Family History  Problem Relation Age of Onset  . Hypertension Mother   . Hyperlipidemia Mother   . Heart disease Father     CABG  . Hyperlipidemia Father   . Hypertension Father   . Cancer Paternal Uncle     THROAT  . Heart disease Paternal Grandfather   . Diabetes Paternal Grandfather     Social History:  History  Substance Use Topics  . Smoking status: Former Smoker    Quit date: 03/13/1990  . Smokeless tobacco: Not on file  . Alcohol Use: No    Review of Systems: Review of Systems  Constitutional: Negative for fever, chills and malaise/fatigue.  HENT: Negative for congestion, ear discharge, ear pain and sore throat.   Eyes: Negative.   Respiratory: Negative for cough, shortness of breath and wheezing.   Cardiovascular: Negative for chest pain, palpitations and leg swelling.  Gastrointestinal: Negative for heartburn, nausea, vomiting, diarrhea, constipation, blood in stool and melena.  Skin: Negative.   Neurological: Negative for dizziness, sensory change, loss of consciousness and headaches.  Psychiatric/Behavioral: Negative for depression. The patient is not nervous/anxious and does not have insomnia.     Physical Exam: Estimated body mass  index is 23.77 kg/(m^2) as calculated from the following:   Height as of this encounter: 5\' 2"  (1.575 m).   Weight as of this encounter: 130 lb (58.968 kg). BP 126/78 mmHg  Pulse 62  Temp(Src) 98 F (36.7 C) (Temporal)  Resp 16  Ht 5\' 2"  (1.575 m)  Wt 130 lb (58.968 kg)  BMI 23.77 kg/m2  General Appearance: Well nourished well developed, in no apparent distress.  Eyes: PERRLA, EOMs, conjunctiva no swelling or erythema ENT/Mouth: Ear canals normal without obstruction, swelling, erythema, or discharge.  TMs normal bilaterally with no erythema, bulging, retraction, or loss of landmark.  Oropharynx moist and clear with no exudate, erythema, or swelling.   Neck: Supple, thyroid normal. No bruits.  No cervical adenopathy Respiratory: Respiratory effort normal, Breath sounds clear A&P without wheeze, rhonchi, rales.  Cardio: RRR without murmurs, rubs or gallops. Brisk peripheral pulses without edema.  Chest: symmetric, with normal excursions Breasts: Symmetric, without lumps, nipple discharge, retractions.  Abdomen: Soft, nontender, no guarding, rebound, hernias, masses, or organomegaly.  Lymphatics: Non tender without lymphadenopathy.  Genitourinary:  Musculoskeletal: Full ROM all peripheral extremities,5/5 strength, and normal gait.  Skin: Warm, dry without rashes, lesions, ecchymosis. Neuro: Awake and oriented X 3, Cranial nerves intact, reflexes equal bilaterally. Normal muscle tone, no cerebellar symptoms. Sensation intact.  Psych:  normal affect, Insight and Judgment appropriate.   EKG: WNL no changes.  AORTA SCAN: WNL   Over 40 minutes of exam, counseling, chart review and critical decision making was performed  FORCUCCI, Obrien Huskins 2:19 PM [facility]

## 2015-04-30 NOTE — Patient Instructions (Signed)

## 2015-05-01 ENCOUNTER — Other Ambulatory Visit: Payer: Self-pay | Admitting: Internal Medicine

## 2015-05-01 LAB — URINALYSIS, ROUTINE W REFLEX MICROSCOPIC
Bilirubin Urine: NEGATIVE
Glucose, UA: NEGATIVE mg/dL
Hgb urine dipstick: NEGATIVE
Ketones, ur: NEGATIVE mg/dL
Leukocytes, UA: NEGATIVE
Nitrite: NEGATIVE
Protein, ur: NEGATIVE mg/dL
Specific Gravity, Urine: 1.014 (ref 1.005–1.030)
Urobilinogen, UA: 0.2 mg/dL (ref 0.0–1.0)
pH: 7 (ref 5.0–8.0)

## 2015-05-01 LAB — MICROALBUMIN / CREATININE URINE RATIO
Creatinine, Urine: 75.8 mg/dL
Microalb, Ur: <0.2 mg/dL (ref <2.0)

## 2015-05-01 LAB — VITAMIN D 25 HYDROXY (VIT D DEFICIENCY, FRACTURES): Vit D, 25-Hydroxy: 37 ng/mL (ref 30–100)

## 2015-05-01 LAB — INSULIN, RANDOM: Insulin: 5.8 u[IU]/mL (ref 2.0–19.6)

## 2015-05-01 MED ORDER — ATORVASTATIN CALCIUM 40 MG PO TABS: 40.0000 mg | ORAL_TABLET | Freq: Every day | ORAL | Status: DC

## 2015-05-22 ENCOUNTER — Telehealth: Payer: Self-pay | Admitting: Internal Medicine

## 2015-05-22 NOTE — Telephone Encounter (Signed)
Patient said having muscle aches and headache since beginning new chl medicine, given at last office visit. Please advise. Pharm: [facility]   Thank you, Leonie Douglas Referral Coordinator  [facility] [phone] ext. [phone] Fax [phone]

## 2015-05-25 ENCOUNTER — Telehealth: Payer: Self-pay

## 2015-05-25 NOTE — Telephone Encounter (Signed)
Received request for new medication. Pt c/o muscle aches since starting Atorvastatin. [facility]  Please advise.  MCKEOWN,WILLIAM DAVID, MD Current Outpatient Prescriptions  Medication Sig Dispense Refill  . ALPRAZolam (XANAX) 0.5 MG tablet Take 0.5 mg by mouth 2 (two) times daily.    Marland Kitchen atorvastatin (LIPITOR) 40 MG tablet Take 1 tablet (40 mg total) by mouth daily. 30 tablet 11  . Biotin 10 MG TABS Take by mouth daily.    . calcium & magnesium carbonates (MYLANTA) 311-232 MG per tablet Take 1 tablet by mouth daily.    Marland Kitchen co-enzyme Q-10 30 MG capsule Take 30 mg by mouth 3 (three) times daily.    Marland Kitchen escitalopram (LEXAPRO) 20 MG tablet Take 20 mg by mouth daily.    . hydrochlorothiazide (HYDRODIURIL) 25 MG tablet Take 1 tablet (25 mg total) by mouth as needed. 30 tablet 0  . Magnesium 500 MG CAPS Take 500 mg by mouth at bedtime.    . mometasone (NASONEX) 50 MCG/ACT nasal spray Place 2 sprays into the nose daily. 17 g 2  . Probiotic Product (PROBIOTIC DAILY PO) Take by mouth daily.     No current facility-administered medications for this visit.    @AFUTAPPT @

## 2015-05-28 ENCOUNTER — Other Ambulatory Visit: Payer: Self-pay | Admitting: Internal Medicine

## 2015-05-28 MED ORDER — ROSUVASTATIN CALCIUM 5 MG PO TABS: 5.0000 mg | ORAL_TABLET | Freq: Every day | ORAL | Status: DC

## 2015-05-29 ENCOUNTER — Other Ambulatory Visit: Payer: Self-pay | Admitting: Internal Medicine

## 2015-05-29 MED ORDER — PRAVASTATIN SODIUM 40 MG PO TABS: 40.0000 mg | ORAL_TABLET | Freq: Every evening | ORAL | Status: DC

## 2015-05-29 NOTE — Telephone Encounter (Signed)
Patient called with complaint of muscle aches on lipitor.  I have called in crestor which patient states is too expensive.  We can try pravastatin.  I sent it in

## 2015-05-29 NOTE — Telephone Encounter (Signed)
Patient aware.  Will try Pravastatin Rx.

## 2015-06-05 ENCOUNTER — Other Ambulatory Visit (INDEPENDENT_AMBULATORY_CARE_PROVIDER_SITE_OTHER): Payer: 59

## 2015-06-05 DIAGNOSIS — Z1212 Encounter for screening for malignant neoplasm of rectum: Secondary | ICD-10-CM

## 2015-06-05 LAB — POC HEMOCCULT BLD/STL (HOME/3-CARD/SCREEN)
Card #2 Fecal Occult Blod, POC: NEGATIVE
Card #3 Fecal Occult Blood, POC: NEGATIVE
Fecal Occult Blood, POC: NEGATIVE

## 2015-07-02 ENCOUNTER — Other Ambulatory Visit: Payer: Self-pay | Admitting: Obstetrics & Gynecology

## 2015-07-03 LAB — CYTOLOGY - PAP

## 2015-07-05 ENCOUNTER — Ambulatory Visit (INDEPENDENT_AMBULATORY_CARE_PROVIDER_SITE_OTHER): Payer: 59 | Admitting: Internal Medicine

## 2015-07-05 ENCOUNTER — Encounter: Payer: Self-pay | Admitting: Internal Medicine

## 2015-07-05 VITALS — BP 136/80 | HR 68 | Temp 98.2°F | Resp 18 | Ht 62.0 in | Wt 127.0 lb

## 2015-07-05 DIAGNOSIS — R197 Diarrhea, unspecified: Secondary | ICD-10-CM

## 2015-07-05 LAB — HEPATIC FUNCTION PANEL
ALT: 10 U/L (ref 6–29)
AST: 17 U/L (ref 10–35)
Albumin: 4.3 g/dL (ref 3.6–5.1)
Alkaline Phosphatase: 45 U/L (ref 33–130)
Bilirubin, Direct: 0.1 mg/dL (ref <=0.2)
Indirect Bilirubin: 0.4 mg/dL (ref 0.2–1.2)
Total Bilirubin: 0.5 mg/dL (ref 0.2–1.2)
Total Protein: 6.9 g/dL (ref 6.1–8.1)

## 2015-07-05 LAB — CBC WITH DIFFERENTIAL/PLATELET
Basophils Absolute: 0 10*3/uL (ref 0.0–0.1)
Basophils Relative: 0 % (ref 0–1)
Eosinophils Absolute: 0.1 10*3/uL (ref 0.0–0.7)
Eosinophils Relative: 1 % (ref 0–5)
HCT: 40 % (ref 36.0–46.0)
Hemoglobin: 13.3 g/dL (ref 12.0–15.0)
Lymphocytes Relative: 35 % (ref 12–46)
Lymphs Abs: 2.5 10*3/uL (ref 0.7–4.0)
MCH: 32 pg (ref 26.0–34.0)
MCHC: 33.3 g/dL (ref 30.0–36.0)
MCV: 96.4 fL (ref 78.0–100.0)
MPV: 10.5 fL (ref 8.6–12.4)
Monocytes Absolute: 0.6 10*3/uL (ref 0.1–1.0)
Monocytes Relative: 9 % (ref 3–12)
Neutro Abs: 4 10*3/uL (ref 1.7–7.7)
Neutrophils Relative %: 55 % (ref 43–77)
Platelets: 274 10*3/uL (ref 150–400)
RBC: 4.15 MIL/uL (ref 3.87–5.11)
RDW: 13.8 % (ref 11.5–15.5)
WBC: 7.2 10*3/uL (ref 4.0–10.5)

## 2015-07-05 LAB — BASIC METABOLIC PANEL WITH GFR
BUN: 12 mg/dL (ref 7–25)
CO2: 28 mEq/L (ref 20–31)
Calcium: 9.4 mg/dL (ref 8.6–10.4)
Chloride: 99 mEq/L (ref 98–110)
Creat: 0.8 mg/dL (ref 0.50–1.05)
GFR, Est African American: >89 mL/min (ref >=60)
GFR, Est Non African American: 82 mL/min (ref >=60)
Glucose, Bld: 84 mg/dL (ref 65–99)
Potassium: 3.9 mEq/L (ref 3.5–5.3)
Sodium: 140 mEq/L (ref 135–146)

## 2015-07-05 MED ORDER — METOCLOPRAMIDE HCL 10 MG PO TABS: 10.0000 mg | ORAL_TABLET | Freq: Three times a day (TID) | ORAL | Status: DC | PRN

## 2015-07-05 MED ORDER — LOPERAMIDE HCL 2 MG PO CAPS: ORAL_CAPSULE | ORAL | Status: DC

## 2015-07-05 MED ORDER — DICYCLOMINE HCL 20 MG PO TABS: 20.0000 mg | ORAL_TABLET | Freq: Three times a day (TID) | ORAL | Status: DC | PRN

## 2015-07-05 NOTE — Progress Notes (Signed)
Patient ID: Norma Cameron, female   DOB: 09-May-1956, 59 y.o.   MRN: [id number]  HPI  Patient complains of diarrhea. Onset was 2 week ago. Symptoms have been unchanged. The pain is described as non-painful but with the urgency to use the bathroom. Pain is located in the suprapubic region without radiation.  Aggravating factors: none.  Alleviating factors: immodium. Associated symptoms: none. The patient denies belching, chills, constipation, dysuria, fever, flatus, frequency, hematochezia, hematuria, melena, nausea and vomiting.   No other sick contacts.  She did have antibiotics in May.  No recent travel.  No surgeries to the abdomen.  No abnormal food.    Review of Systems  Constitutional: Negative for fever, chills and malaise/fatigue.  Skin: Negative.   Neurological: Negative for headaches.    Physical exam:  Filed Vitals:   07/05/15 1057  BP: 136/80  Pulse: 68  Temp: 98.2 F (36.8 C)  Resp: 18     General:  Well developed well nourished, non-toxic appearing, NAD Head:  NCAT, PERLA, normal EOM, conjunctiva normal, ears clear bilaterally with normal TMs, Oropharynx clear and moist without exudate, no oropharyngeal erythema or swelling Neck:  No JVD, no cervical adenopathy, no thyromegaly Lungs:  Clear to auscultation A&P, no wheeze, rhonchi, rales.  Normal effort Heart:  RRR, no MRGs, normal peripheral pulses Abd:  +BS x 4, no distention, soft, non-tender, with no guarding, rigidity, or rebound.   GU:  No CVA tenderness bilaterally Skin:  No rash, warm and dry Neuro:  A&O x 3, CN II-XII intact, normal gait Psych:  Normal affect, no ideations, normal judgement and insight  Assessment and Plan:   1. Diarrhea  Try symptomatic treatment with reglan and bentyl and immodium prn.  If no relief or abnormal labs we have option to use cipro/flagyl.  Patient has recently been on abx in march less likely to be C. Diff but will check to be sure. Encouraged proper hydration and BRAT diet.   Patient to go to the ER if she has worsening abdominal pain, intractable fevers/vomiting, or dehydration symptoms which we discussed.  She stated understanding.  No imaging needed at this time.     - CBC with Differential/Platelet - BASIC METABOLIC PANEL WITH GFR - Hepatic function panel - Urinalysis, Routine w reflex microscopic (not at [facility]) - Urine culture - Culture, Stool - Ova and parasite examination - Clostridium Difficile by PCR

## 2015-07-05 NOTE — Patient Instructions (Signed)

## 2015-07-06 LAB — URINALYSIS, ROUTINE W REFLEX MICROSCOPIC
Bilirubin Urine: NEGATIVE
Glucose, UA: NEGATIVE
Hgb urine dipstick: NEGATIVE
Ketones, ur: NEGATIVE
Leukocytes, UA: NEGATIVE
Nitrite: NEGATIVE
Protein, ur: NEGATIVE
Specific Gravity, Urine: 1.005 (ref 1.001–1.035)
pH: 6 (ref 5.0–8.0)

## 2015-07-08 LAB — CLOSTRIDIUM DIFFICILE BY PCR: Toxigenic C. Difficile by PCR: NOT DETECTED

## 2015-07-09 LAB — OVA AND PARASITE EXAMINATION: OP: NONE SEEN

## 2015-07-09 LAB — STOOL CULTURE

## 2015-07-10 LAB — URINE CULTURE
Colony Count: NO GROWTH
Organism ID, Bacteria: NO GROWTH

## 2015-08-06 ENCOUNTER — Other Ambulatory Visit: Payer: Self-pay | Admitting: *Deleted

## 2015-08-06 MED ORDER — PRAVASTATIN SODIUM 40 MG PO TABS: 40.0000 mg | ORAL_TABLET | Freq: Every evening | ORAL | Status: DC

## 2015-10-01 ENCOUNTER — Encounter: Payer: Self-pay | Admitting: Physician Assistant

## 2015-10-01 ENCOUNTER — Ambulatory Visit (INDEPENDENT_AMBULATORY_CARE_PROVIDER_SITE_OTHER): Payer: 59 | Admitting: Physician Assistant

## 2015-10-01 VITALS — BP 136/80 | HR 64 | Temp 98.1°F | Resp 14 | Ht 62.0 in | Wt 133.0 lb

## 2015-10-01 DIAGNOSIS — G5601 Carpal tunnel syndrome, right upper limb: Secondary | ICD-10-CM | POA: Diagnosis not present

## 2015-10-01 DIAGNOSIS — M25531 Pain in right wrist: Secondary | ICD-10-CM

## 2015-10-01 MED ORDER — MELOXICAM 15 MG PO TABS: ORAL_TABLET | ORAL | Status: DC

## 2015-10-01 NOTE — Patient Instructions (Signed)

## 2015-10-01 NOTE — Progress Notes (Signed)
   Subjective:    Patient ID: Norma Cameron, female    DOB: 07/29/1956, 59 y.o.   MRN: [id number]  HPI 59 y.o. right handed WF presents with right wrist pain. She has had right wrist pain since sept, no injury/fall. Having swelling, tenderness at her palm/wrist with numbness in her 1-3rd fingers, some weakness in her right grip, taking iburofen, She works as Web designer.   Blood pressure 136/80, pulse 64, temperature 98.1 F (36.7 C), temperature source Temporal, resp. rate 14, height 5\' 2"  (1.575 m), weight 133 lb (60.328 kg), SpO2 98 %.   Review of Systems  Constitutional: Negative for fever, chills and fatigue.  HENT: Negative.   Respiratory: Negative.   Cardiovascular: Negative.   Gastrointestinal: Negative.   Musculoskeletal: Positive for myalgias and arthralgias. Negative for back pain, joint swelling, gait problem, neck pain and neck stiffness.  Skin: Negative.  Negative for rash.  Neurological: Positive for numbness. Negative for dizziness, tremors, seizures, syncope, facial asymmetry, speech difficulty, weakness, light-headedness and headaches.       Objective:   Physical Exam  Constitutional: She is oriented to person, place, and time. She appears well-developed and well-nourished.  HENT:  Head: Normocephalic.  Cardiovascular: Normal rate and regular rhythm.   Pulmonary/Chest: Effort normal and breath sounds normal.  Musculoskeletal:  Right neurovascular exam is normal, no thenar or hypothenar atrophy, Phalen's: positive, sensation normal, strength normal, Tinel's sign: negative and wrist has normal range of motion. No warmth, swelling.   Neurological: She is alert and oriented to person, place, and time. She has normal reflexes. No cranial nerve deficit.  Skin: Skin is warm and dry. No rash noted.       Assessment & Plan:  Right carpal tunnel-wear a brace at night 6-8 weeks, RICE, NSAIDS, due to weakness and being right handed with her job, will refer to  ortho.

## 2015-11-07 ENCOUNTER — Encounter: Payer: Self-pay | Admitting: Internal Medicine

## 2015-11-07 ENCOUNTER — Ambulatory Visit (INDEPENDENT_AMBULATORY_CARE_PROVIDER_SITE_OTHER): Payer: 59 | Admitting: Internal Medicine

## 2015-11-07 VITALS — BP 124/80 | HR 62 | Temp 98.0°F | Resp 16 | Ht 62.0 in | Wt 133.0 lb

## 2015-11-07 DIAGNOSIS — R7303 Prediabetes: Secondary | ICD-10-CM | POA: Diagnosis not present

## 2015-11-07 DIAGNOSIS — E785 Hyperlipidemia, unspecified: Secondary | ICD-10-CM | POA: Diagnosis not present

## 2015-11-07 DIAGNOSIS — E039 Hypothyroidism, unspecified: Secondary | ICD-10-CM | POA: Diagnosis not present

## 2015-11-07 DIAGNOSIS — Z79899 Other long term (current) drug therapy: Secondary | ICD-10-CM

## 2015-11-07 LAB — HEPATIC FUNCTION PANEL
ALT: 18 U/L (ref 6–29)
AST: 23 U/L (ref 10–35)
Albumin: 4.2 g/dL (ref 3.6–5.1)
Alkaline Phosphatase: 47 U/L (ref 33–130)
Bilirubin, Direct: 0.1 mg/dL (ref <=0.2)
Indirect Bilirubin: 0.6 mg/dL (ref 0.2–1.2)
Total Bilirubin: 0.7 mg/dL (ref 0.2–1.2)
Total Protein: 7 g/dL (ref 6.1–8.1)

## 2015-11-07 LAB — LIPID PANEL
Cholesterol: 199 mg/dL (ref 125–200)
HDL: 82 mg/dL (ref >=46)
LDL Cholesterol: 101 mg/dL (ref <130)
Total CHOL/HDL Ratio: 2.4 Ratio (ref <=5.0)
Triglycerides: 78 mg/dL (ref <150)
VLDL: 16 mg/dL (ref <30)

## 2015-11-07 LAB — BASIC METABOLIC PANEL WITH GFR
BUN: 13 mg/dL (ref 7–25)
CO2: 26 mmol/L (ref 20–31)
Calcium: 9.6 mg/dL (ref 8.6–10.4)
Chloride: 103 mmol/L (ref 98–110)
Creat: 0.83 mg/dL (ref 0.50–1.05)
GFR, Est African American: >89 mL/min (ref >=60)
GFR, Est Non African American: 78 mL/min (ref >=60)
Glucose, Bld: 89 mg/dL (ref 65–99)
Potassium: 4.6 mmol/L (ref 3.5–5.3)
Sodium: 140 mmol/L (ref 135–146)

## 2015-11-07 LAB — CBC WITH DIFFERENTIAL/PLATELET
Basophils Absolute: 0.1 10*3/uL (ref 0.0–0.1)
Basophils Relative: 1 % (ref 0–1)
Eosinophils Absolute: 0.1 10*3/uL (ref 0.0–0.7)
Eosinophils Relative: 2 % (ref 0–5)
HCT: 39.5 % (ref 36.0–46.0)
Hemoglobin: 13.6 g/dL (ref 12.0–15.0)
Lymphocytes Relative: 40 % (ref 12–46)
Lymphs Abs: 2.4 10*3/uL (ref 0.7–4.0)
MCH: 32.9 pg (ref 26.0–34.0)
MCHC: 34.4 g/dL (ref 30.0–36.0)
MCV: 95.4 fL (ref 78.0–100.0)
MPV: 10.3 fL (ref 8.6–12.4)
Monocytes Absolute: 0.4 10*3/uL (ref 0.1–1.0)
Monocytes Relative: 7 % (ref 3–12)
Neutro Abs: 3.1 10*3/uL (ref 1.7–7.7)
Neutrophils Relative %: 50 % (ref 43–77)
Platelets: 257 10*3/uL (ref 150–400)
RBC: 4.14 MIL/uL (ref 3.87–5.11)
RDW: 14.6 % (ref 11.5–15.5)
WBC: 6.1 10*3/uL (ref 4.0–10.5)

## 2015-11-07 LAB — HEMOGLOBIN A1C
Hgb A1c MFr Bld: 5.6 % (ref <5.7)
Mean Plasma Glucose: 114 mg/dL (ref <117)

## 2015-11-07 LAB — TSH: TSH: 1.347 u[IU]/mL (ref 0.350–4.500)

## 2015-11-07 MED ORDER — PRAVASTATIN SODIUM 40 MG PO TABS: 40.0000 mg | ORAL_TABLET | Freq: Every evening | ORAL | Status: DC

## 2015-11-07 NOTE — Progress Notes (Signed)
Patient ID: Norma Cameron, female   DOB: [birth year], 59 y.o.   MRN: [id number]  Assessment and Plan:  Hypertension:  -Continue medication,  -monitor blood pressure at home.  -Continue DASH diet.   -Reminder to go to the ER if any CP, SOB, nausea, dizziness, severe HA, changes vision/speech, left arm numbness and tingling, and jaw pain.  Cholesterol: -Continue diet and exercise.  -Check cholesterol.   Pre-diabetes: -Continue diet and exercise.  -Check A1C  Vitamin D Def: -check level -continue medications.   Carpal Tunnel Syndrome -getting fixed next month -will look for any surgical clearance forms  Continue diet and meds as discussed. Further disposition pending results of labs.  HPI 59 y.o. female  presents for 3 month follow up with hypertension, hyperlipidemia, prediabetes and vitamin D.   Her blood pressure has been controlled at home, today their BP is BP: 124/80 mmHg.   She does workout. She denies chest pain, shortness of breath, dizziness.   She is on cholesterol medication and denies myalgias. Her cholesterol is not at goal. The cholesterol last visit was:   Lab Results  Component Value Date   CHOL 229* 04/30/2015   HDL 71 04/30/2015   LDLCALC 135* 04/30/2015   TRIG 114 04/30/2015   CHOLHDL 3.2 04/30/2015     She has been working on diet and exercise for prediabetes, and denies foot ulcerations, hyperglycemia, hypoglycemia , increased appetite, nausea, paresthesia of the feet, polydipsia, polyuria, visual disturbances, vomiting and weight loss. Last A1C in the office was:  Lab Results  Component Value Date   HGBA1C 5.8* 04/30/2015    Patient is on Vitamin D supplement.  Lab Results  Component Value Date   VD25OH 37 04/30/2015     She is currently planning of having hand surgery Dec 29th with Dr. Fredna Dow.  She reports that she is tired of the tingling in her hands.     Current Medications:  Current Outpatient Prescriptions on File Prior to Visit   Medication Sig Dispense Refill  . ALPRAZolam (XANAX) 0.5 MG tablet Take 0.5 mg by mouth 2 (two) times daily.    Marland Kitchen escitalopram (LEXAPRO) 20 MG tablet Take 20 mg by mouth daily.    . Probiotic Product (PROBIOTIC DAILY PO) Take by mouth daily.     No current facility-administered medications on file prior to visit.    Medical History:  Past Medical History  Diagnosis Date  . Hyperlipidemia   . Vitamin D deficiency   . Allergy   . Depression     CONTROLLED WITH OCCASIONAL XANAX  . Osteopenia   . Fibrocystic breast disease     Allergies:  Allergies  Allergen Reactions  . Eggs Or Egg-Derived Products Hives  . Neosporin [Neomycin-Bacitracin Zn-Polymyx] Hives  . Red Yeast Rice [Cholestin] Other (See Comments)    MYALGIA with higher doses  . Sulfa Antibiotics Hives  . Zostavax [Zoster Vaccine Live] Rash     Review of Systems:  Review of Systems  Constitutional: Negative for fever, chills and malaise/fatigue.  HENT: Negative for congestion, ear pain and sore throat.   Eyes: Negative.   Respiratory: Negative for cough, shortness of breath and wheezing.   Cardiovascular: Negative for chest pain, palpitations and leg swelling.  Gastrointestinal: Negative for heartburn, diarrhea, constipation, blood in stool and melena.  Genitourinary: Negative.   Neurological: Negative for dizziness, loss of consciousness and headaches.  Psychiatric/Behavioral: Negative for depression. The patient is not nervous/anxious and does not have insomnia.  Family history- Review and unchanged  Social history- Review and unchanged  Physical Exam: BP 124/80 mmHg  Pulse 62  Temp(Src) 98 F (36.7 C) (Temporal)  Resp 16  Ht 5\' 2"  (1.575 m)  Wt 133 lb (60.328 kg)  BMI 24.32 kg/m2 Wt Readings from Last 3 Encounters:  11/07/15 133 lb (60.328 kg)  10/01/15 133 lb (60.328 kg)  07/05/15 127 lb (57.607 kg)    General Appearance: Well nourished well developed, in no apparent distress. Eyes:  PERRLA, EOMs, conjunctiva no swelling or erythema ENT/Mouth: Ear canals normal without obstruction, swelling, erythma, discharge.  TMs normal bilaterally.  Oropharynx moist, clear, without exudate, or postoropharyngeal swelling. Neck: Supple, thyroid normal,no cervical adenopathy  Respiratory: Respiratory effort normal, Breath sounds clear A&P without rhonchi, wheeze, or rale.  No retractions, no accessory usage. Cardio: RRR with no MRGs. Brisk peripheral pulses without edema.  Abdomen: Soft, + BS,  Non tender, no guarding, rebound, hernias, masses. Musculoskeletal: Full ROM, 5/5 strength, Normal gait Skin: Warm, dry without rashes, lesions, ecchymosis.  Neuro: Awake and oriented X 3, Cranial nerves intact. Normal muscle tone, no cerebellar symptoms. Psych: Normal affect, Insight and Judgment appropriate.    Starlyn Skeans, PA-C 8:59 AM [facility]

## 2015-11-19 ENCOUNTER — Other Ambulatory Visit: Payer: Self-pay | Admitting: Orthopedic Surgery

## 2015-11-27 ENCOUNTER — Encounter (HOSPITAL_BASED_OUTPATIENT_CLINIC_OR_DEPARTMENT_OTHER): Payer: Self-pay | Admitting: *Deleted

## 2015-12-06 ENCOUNTER — Ambulatory Visit (HOSPITAL_BASED_OUTPATIENT_CLINIC_OR_DEPARTMENT_OTHER)
Admission: RE | Admit: 2015-12-06 | Discharge: 2015-12-06 | Disposition: A | Discharge status: Home / Self Care | Payer: 59 | Source: Ambulatory Visit | Attending: Orthopedic Surgery | Admitting: Orthopedic Surgery

## 2015-12-06 ENCOUNTER — Ambulatory Visit (HOSPITAL_BASED_OUTPATIENT_CLINIC_OR_DEPARTMENT_OTHER): Payer: 59 | Admitting: Anesthesiology

## 2015-12-06 ENCOUNTER — Encounter (HOSPITAL_BASED_OUTPATIENT_CLINIC_OR_DEPARTMENT_OTHER): Payer: Self-pay | Admitting: *Deleted

## 2015-12-06 ENCOUNTER — Encounter (HOSPITAL_BASED_OUTPATIENT_CLINIC_OR_DEPARTMENT_OTHER)
Admission: RE | Disposition: A | Discharge status: Home / Self Care | Payer: Self-pay | Source: Ambulatory Visit | Attending: Orthopedic Surgery

## 2015-12-06 DIAGNOSIS — Z8249 Family history of ischemic heart disease and other diseases of the circulatory system: Secondary | ICD-10-CM | POA: Insufficient documentation

## 2015-12-06 DIAGNOSIS — G5601 Carpal tunnel syndrome, right upper limb: Secondary | ICD-10-CM | POA: Insufficient documentation

## 2015-12-06 DIAGNOSIS — E559 Vitamin D deficiency, unspecified: Secondary | ICD-10-CM | POA: Insufficient documentation

## 2015-12-06 DIAGNOSIS — Z87891 Personal history of nicotine dependence: Secondary | ICD-10-CM | POA: Diagnosis not present

## 2015-12-06 DIAGNOSIS — E785 Hyperlipidemia, unspecified: Secondary | ICD-10-CM | POA: Insufficient documentation

## 2015-12-06 DIAGNOSIS — Z79899 Other long term (current) drug therapy: Secondary | ICD-10-CM | POA: Diagnosis not present

## 2015-12-06 DIAGNOSIS — F329 Major depressive disorder, single episode, unspecified: Secondary | ICD-10-CM | POA: Insufficient documentation

## 2015-12-06 HISTORY — PX: CARPAL TUNNEL RELEASE: SHX101

## 2015-12-06 SURGERY — CARPAL TUNNEL RELEASE
Anesthesia: Monitor Anesthesia Care | Site: Hand | Laterality: Right

## 2015-12-06 MED ORDER — HYDROCODONE-ACETAMINOPHEN 5-325 MG PO TABS: ORAL_TABLET | ORAL | Status: DC

## 2015-12-06 MED ORDER — ONDANSETRON HCL 4 MG/2ML IJ SOLN
INTRAMUSCULAR | Status: DC | PRN
  Administered 2015-12-06: 4 mg via INTRAVENOUS

## 2015-12-06 MED ORDER — ONDANSETRON HCL 4 MG/2ML IJ SOLN
INTRAMUSCULAR | Status: AC
  Filled 2015-12-06: qty 2

## 2015-12-06 MED ORDER — LIDOCAINE HCL (CARDIAC) 20 MG/ML IV SOLN
INTRAVENOUS | Status: DC | PRN
  Administered 2015-12-06: 35 mg via INTRAVENOUS
  Administered 2015-12-06: 20 mg via INTRAVENOUS

## 2015-12-06 MED ORDER — CEFAZOLIN SODIUM-DEXTROSE 2-3 GM-% IV SOLR
INTRAVENOUS | Status: AC
  Filled 2015-12-06: qty 50

## 2015-12-06 MED ORDER — MEPERIDINE HCL 25 MG/ML IJ SOLN: 6.2500 mg | INTRAMUSCULAR | Status: DC | PRN

## 2015-12-06 MED ORDER — GLYCOPYRROLATE 0.2 MG/ML IJ SOLN: 0.2000 mg | Freq: Once | INTRAMUSCULAR | Status: DC | PRN

## 2015-12-06 MED ORDER — MIDAZOLAM HCL 2 MG/2ML IJ SOLN
INTRAMUSCULAR | Status: AC
  Filled 2015-12-06: qty 2

## 2015-12-06 MED ORDER — KETOROLAC TROMETHAMINE 30 MG/ML IJ SOLN
INTRAMUSCULAR | Status: DC | PRN
  Administered 2015-12-06: 30 mg via INTRAVENOUS

## 2015-12-06 MED ORDER — KETOROLAC TROMETHAMINE 30 MG/ML IJ SOLN
INTRAMUSCULAR | Status: AC
  Filled 2015-12-06: qty 1

## 2015-12-06 MED ORDER — FENTANYL CITRATE (PF) 100 MCG/2ML IJ SOLN
INTRAMUSCULAR | Status: AC
  Filled 2015-12-06: qty 2

## 2015-12-06 MED ORDER — CHLORHEXIDINE GLUCONATE 4 % EX LIQD: 60.0000 mL | Freq: Once | CUTANEOUS | Status: DC

## 2015-12-06 MED ORDER — LIDOCAINE HCL (CARDIAC) 20 MG/ML IV SOLN
INTRAVENOUS | Status: AC
  Filled 2015-12-06: qty 5

## 2015-12-06 MED ORDER — BUPIVACAINE HCL (PF) 0.25 % IJ SOLN
INTRAMUSCULAR | Status: DC | PRN
  Administered 2015-12-06: 10 mL

## 2015-12-06 MED ORDER — FENTANYL CITRATE (PF) 100 MCG/2ML IJ SOLN: 25.0000 ug | INTRAMUSCULAR | Status: DC | PRN

## 2015-12-06 MED ORDER — CEFAZOLIN SODIUM-DEXTROSE 2-3 GM-% IV SOLR
2.0000 g | INTRAVENOUS | Status: AC
  Administered 2015-12-06: 2 g via INTRAVENOUS

## 2015-12-06 MED ORDER — PROPOFOL 10 MG/ML IV BOLUS
INTRAVENOUS | Status: DC | PRN
  Administered 2015-12-06: 20 mg via INTRAVENOUS

## 2015-12-06 MED ORDER — FENTANYL CITRATE (PF) 100 MCG/2ML IJ SOLN
50.0000 ug | INTRAMUSCULAR | Status: DC | PRN
  Administered 2015-12-06: 100 ug via INTRAVENOUS

## 2015-12-06 MED ORDER — MIDAZOLAM HCL 2 MG/2ML IJ SOLN
1.0000 mg | INTRAMUSCULAR | Status: DC | PRN
  Administered 2015-12-06: 2 mg via INTRAVENOUS

## 2015-12-06 MED ORDER — SCOPOLAMINE 1 MG/3DAYS TD PT72: 1.0000 | MEDICATED_PATCH | Freq: Once | TRANSDERMAL | Status: DC | PRN

## 2015-12-06 MED ORDER — LACTATED RINGERS IV SOLN
INTRAVENOUS | Status: DC
  Administered 2015-12-06: 08:00:00 via INTRAVENOUS

## 2015-12-06 SURGICAL SUPPLY — 37 items
BANDAGE ACE 3X5.8 VEL STRL LF (GAUZE/BANDAGES/DRESSINGS) ×1 IMPLANT
BANDAGE ELASTIC 3 VELCRO ST LF (GAUZE/BANDAGES/DRESSINGS) ×2 IMPLANT
BLADE SURG 15 STRL LF DISP TIS (BLADE) ×2 IMPLANT
BLADE SURG 15 STRL SS (BLADE) ×4
BNDG CMPR 9X4 STRL LF SNTH (GAUZE/BANDAGES/DRESSINGS) ×1
BNDG ESMARK 4X9 LF (GAUZE/BANDAGES/DRESSINGS) ×1 IMPLANT
BNDG GAUZE ELAST 4 BULKY (GAUZE/BANDAGES/DRESSINGS) ×2 IMPLANT
CHLORAPREP W/TINT 26ML (MISCELLANEOUS) ×2 IMPLANT
CORDS BIPOLAR (ELECTRODE) ×2 IMPLANT
COVER BACK TABLE 60X90IN (DRAPES) ×2 IMPLANT
COVER MAYO STAND STRL (DRAPES) ×2 IMPLANT
CUFF TOURNIQUET SINGLE 18IN (TOURNIQUET CUFF) ×2 IMPLANT
DRAPE EXTREMITY T 121X128X90 (DRAPE) ×2 IMPLANT
DRAPE SURG 17X23 STRL (DRAPES) ×2 IMPLANT
DRSG PAD ABDOMINAL 8X10 ST (GAUZE/BANDAGES/DRESSINGS) ×2 IMPLANT
GAUZE SPONGE 4X4 12PLY STRL (GAUZE/BANDAGES/DRESSINGS) ×2 IMPLANT
GAUZE XEROFORM 1X8 LF (GAUZE/BANDAGES/DRESSINGS) ×2 IMPLANT
GLOVE BIO SURGEON STRL SZ7.5 (GLOVE) ×3 IMPLANT
GLOVE BIOGEL M STRL SZ7.5 (GLOVE) ×1 IMPLANT
GLOVE BIOGEL PI IND STRL 8 (GLOVE) ×1 IMPLANT
GLOVE BIOGEL PI INDICATOR 8 (GLOVE) ×2
GOWN STRL REUS W/ TWL LRG LVL3 (GOWN DISPOSABLE) ×1 IMPLANT
GOWN STRL REUS W/TWL LRG LVL3 (GOWN DISPOSABLE) ×2
GOWN STRL REUS W/TWL XL LVL3 (GOWN DISPOSABLE) ×3 IMPLANT
NDL HYPO 25X1 1.5 SAFETY (NEEDLE) IMPLANT
NEEDLE HYPO 25X1 1.5 SAFETY (NEEDLE) IMPLANT
NS IRRIG 1000ML POUR BTL (IV SOLUTION) ×2 IMPLANT
PACK BASIN DAY SURGERY FS (CUSTOM PROCEDURE TRAY) ×2 IMPLANT
PADDING CAST ABS 4INX4YD NS (CAST SUPPLIES) ×1
PADDING CAST ABS COTTON 4X4 ST (CAST SUPPLIES) ×1 IMPLANT
SPONGE GAUZE 4X4 12PLY STER LF (GAUZE/BANDAGES/DRESSINGS) ×1 IMPLANT
STOCKINETTE 4X48 STRL (DRAPES) ×2 IMPLANT
SUT ETHILON 4 0 PS 2 18 (SUTURE) ×2 IMPLANT
SYR BULB 3OZ (MISCELLANEOUS) ×2 IMPLANT
SYR CONTROL 10ML LL (SYRINGE) ×1 IMPLANT
TOWEL OR 17X24 6PK STRL BLUE (TOWEL DISPOSABLE) ×4 IMPLANT
UNDERPAD 30X30 (UNDERPADS AND DIAPERS) ×2 IMPLANT

## 2015-12-06 NOTE — Anesthesia Postprocedure Evaluation (Signed)
Anesthesia Post Note  Patient: Norma Cameron  Procedure(s) Performed: Procedure(s) (LRB): RIGHT CARPAL TUNNEL RELEASE (Right)  Patient location during evaluation: PACU Anesthesia Type: MAC Level of consciousness: awake and alert Pain management: pain level controlled Vital Signs Assessment: post-procedure vital signs reviewed and stable Respiratory status: spontaneous breathing, nonlabored ventilation, respiratory function stable and patient connected to nasal cannula oxygen Cardiovascular status: stable and blood pressure returned to baseline Anesthetic complications: no    Last Vitals:  Filed Vitals:   12/06/15 1000 12/06/15 1015  BP: 137/69 137/74  Pulse: 63 65  Temp:    Resp: 13 12    Last Pain:  Filed Vitals:   12/06/15 1019  PainSc: 0-No pain                 Townsend Cudworth S

## 2015-12-06 NOTE — Op Note (Signed)
[id number] 

## 2015-12-06 NOTE — Op Note (Addendum)
NAMEMARISSIA, [name]  MEDICAL RECORD NO.:  [id number]  LOCATION:                                 FACILITY:  PHYSICIAN:  Leanora Cover, MD             DATE OF BIRTH:  DATE OF PROCEDURE:  12/06/2015 DATE OF DISCHARGE:                              OPERATIVE REPORT   PREOPERATIVE DIAGNOSIS:  Right carpal tunnel syndrome.  POSTOPERATIVE DIAGNOSIS:  Right carpal tunnel syndrome.  PROCEDURE:  Right carpal tunnel release.  SURGEON:  Leanora Cover, MD.  ASSISTANT:  None.  ANESTHESIA:  Bier block with sedation.  IV FLUIDS:  Per anesthesia flow sheet.  ESTIMATED BLOOD LOSS:  Minimal.  COMPLICATIONS:  None.  SPECIMENS:  None.  TIME OF TOURNIQUET:  22 minutes.  DISPOSITION:  Stable to PACU.  INDICATIONS:  Norma Cameron is a 59 year old female, who has had pins and needle sensation in the right hand.  This is bothersome to her.  She has positive nerve conduction studies.  She wished to have a surgical release.  Risks, benefits, and alternatives of surgery were discussed including risk of blood loss; infection; damage to nerves, vessels, tendons, ligaments, bone; failure of surgery; need for additional surgery; complications with wound healing; continued pain, recurrence of carpal tunnel syndrome; and damage motor branch.  She voiced understanding of these risks and elected to proceed.  OPERATIVE COURSE:  After being identified preoperatively by myself, the patient and I agreed upon procedure and site of procedure.  Surgical site was marked.  The risks, benefits, and alternatives of surgery were reviewed and she wished to proceed.  Surgical consent had been signed. She was given IV Ancef as preoperative antibiotic prophylaxis.  She was transferred to the operating room and placed on the operating room table in supine position with the right upper extremity on arm board.  Bier block anesthesia was induced by Anesthesiology.  Right upper  extremity was prepped and draped in normal sterile orthopedic fashion.  Surgical pause was performed between surgeons, anesthesia, and operating room staff, and all were in agreement as to the patient, procedure, and site of procedure.  Tourniquet at the proximal aspect the forearm had been inflated for the Bier block.  Incision was made over the transverse carpal ligament and carried down to the subcutaneous tissues by spreading technique.  Bipolar electrocautery was used to obtain hemostasis.  The palmar fascia was sharply incised.  Transverse carpal ligament was identified.  It was excised distally first.  Care was taken to ensure complete decompression distally and incised proximally.  The scissors used to split the distal aspect of the volar antebrachial fascia.  The finger was placed into the wound to ensure complete decompression which was the case.  The nerve was inspected.  It was adherent radially.  The motor branch was identified and was intact.  The wound was copiously irrigated with sterile saline and closed with 4-0 nylon in a horizontal mattress fashion.  It was then dressed with sterile Xeroform, 4x4s, and ABD and wrapped with Kerlix and an Ace bandage.  Tourniquet deflated at 22 minutes.  Fingertips were pink with  brisk capillary refill after deflation of tourniquet.  Operative drapes were broken down.  The patient was awoken from anesthesia safely.  She was transferred back to stretcher and taken to PACU in stable condition. I will see her back in the office in 1 week for postoperative followup. I will give her Norco 5/325, 1-2 p.o. q.6 hours p.r.n. pain, dispensed #30.     Leanora Cover, MD     KK/MEDQ  D:  12/06/2015  T:  12/06/2015  Job:  [id number]

## 2015-12-06 NOTE — H&P (Signed)
  Norma Cameron is an 59 y.o. female.   Chief Complaint: carpal tunnel syndrome HPI: 59 yo female with pins and needles sensation in right hand.  Positive nerve conduction studies.  This is bothersome to her and she wishes to have it released.  Past Medical History  Diagnosis Date  . Hyperlipidemia   . Vitamin D deficiency   . Allergy   . Depression     CONTROLLED WITH OCCASIONAL XANAX  . Osteopenia   . Fibrocystic breast disease     Past Surgical History  Procedure Laterality Date  . Dilation and curettage of uterus  2006  . Novasure ablation  2006  . Cervix lesion destruction      EARLY 20'S  . Skin cancer excision Right 10/2011    LOWER EXT BCC    Family History  Problem Relation Age of Onset  . Hypertension Mother   . Hyperlipidemia Mother   . Heart disease Father     CABG  . Hyperlipidemia Father   . Hypertension Father   . Cancer Paternal Uncle     THROAT  . Heart disease Paternal Grandfather   . Diabetes Paternal Grandfather    Social History:  reports that she quit smoking about 25 years ago. She does not have any smokeless tobacco history on file. She reports that she does not drink alcohol or use illicit drugs.  Allergies:  Allergies  Allergen Reactions  . Eggs Or Egg-Derived Products Hives  . Neosporin [Neomycin-Bacitracin Zn-Polymyx] Hives  . Red Yeast Rice [Cholestin] Other (See Comments)    MYALGIA with higher doses  . Sulfa Antibiotics Hives  . Zostavax [Zoster Vaccine Live] Rash    Medications Prior to Admission  Medication Sig Dispense Refill  . ALPRAZolam (XANAX) 0.5 MG tablet Take 0.5 mg by mouth 2 (two) times daily.    . cetirizine (ZYRTEC) 10 MG chewable tablet Chew 10 mg by mouth daily.    Marland Kitchen escitalopram (LEXAPRO) 20 MG tablet Take 20 mg by mouth daily.    . pravastatin (PRAVACHOL) 40 MG tablet Take 1 tablet (40 mg total) by mouth every evening. 90 tablet 1  . Specialty Vitamins Products (MAGNESIUM, AMINO ACID CHELATE,) 133 MG tablet Take  1 tablet by mouth once.    . Probiotic Product (PROBIOTIC DAILY PO) Take by mouth daily.      No results found for this or any previous visit (from the past 48 hour(s)).  No results found.   A comprehensive review of systems was negative except for: Eyes: positive for contacts/glasses Behavioral/Psych: positive for depression  Blood pressure 139/59, pulse 59, temperature 98.1 F (36.7 C), temperature source Oral, resp. rate 20, height 5\' 2"  (1.575 m), weight 60.782 kg (134 lb), SpO2 99 %.  General appearance: alert, cooperative and appears stated age Head: Normocephalic, without obvious abnormality, atraumatic Neck: supple, symmetrical, trachea midline Resp: clear to auscultation bilaterally Cardio: regular rate and rhythm GI: non tender Extremities: intact sensation and capillary refill all digits.  +epl/fpll/io.  Pulses: 2+ and symmetric Skin: Skin color, texture, turgor normal. No rashes or lesions Neurologic: Grossly normal Incision/Wound: none  Assessment/Plan Right carpal tunnel syndrome.  Non operative and operative treatment options were discussed with the patient and patient wishes to proceed with operative treatment. Risks, benefits, and alternatives of surgery were discussed and the patient agrees with the plan of care.   [name] R 12/06/2015, 8:28 AM

## 2015-12-06 NOTE — Discharge Instructions (Addendum)

## 2015-12-06 NOTE — Anesthesia Procedure Notes (Signed)
Procedure Name: MAC Performed by: Riot Barrick W Pre-anesthesia Checklist: Patient identified, Timeout performed, Emergency Drugs available, Suction available and Patient being monitored Patient Re-evaluated:Patient Re-evaluated prior to inductionOxygen Delivery Method: Simple face mask Placement Confirmation: positive ETCO2     

## 2015-12-06 NOTE — Anesthesia Preprocedure Evaluation (Signed)
Anesthesia Evaluation  Patient identified by MRN, date of birth, ID band Patient awake    Airway Mallampati: I  TM Distance: >3 FB Neck ROM: Full    Dental  (+) Teeth Intact, Dental Advisory Given   Pulmonary former smoker,    breath sounds clear to auscultation       Cardiovascular  Rhythm:Regular Rate:Normal     Neuro/Psych    GI/Hepatic   Endo/Other    Renal/GU      Musculoskeletal   Abdominal   Peds  Hematology   Anesthesia Other Findings   Reproductive/Obstetrics                             Anesthesia Physical Anesthesia Plan  ASA: I  Anesthesia Plan: Bier Block and MAC   Post-op Pain Management:    Induction: Intravenous  Airway Management Planned: Simple Face Mask  Additional Equipment:   Intra-op Plan:   Post-operative Plan:   Informed Consent: I have reviewed the patients History and Physical, chart, labs and discussed the procedure including the risks, benefits and alternatives for the proposed anesthesia with the patient or authorized representative who has indicated his/her understanding and acceptance.   Dental advisory given  Plan Discussed with: Anesthesiologist, CRNA and Surgeon  Anesthesia Plan Comments:         Anesthesia Quick Evaluation

## 2015-12-06 NOTE — Brief Op Note (Signed)
12/06/2015  9:54 AM  PATIENT:  Lindalou Hose  59 y.o. female  PRE-OPERATIVE DIAGNOSIS:  RIGHT CARPAL TUNNEL SYNDROME   POST-OPERATIVE DIAGNOSIS:  RIGHT CARPAL TUNNEL SYNDROME   PROCEDURE:  Procedure(s): RIGHT CARPAL TUNNEL RELEASE (Right)  SURGEON:  Surgeon(s) and Role:    * Leanora Cover, MD - Primary  PHYSICIAN ASSISTANT:   ASSISTANTS: none   ANESTHESIA:   Bier block with sedation  EBL:  Total I/O In: 700 [I.V.:700] Out: -   BLOOD ADMINISTERED:none  DRAINS: none   LOCAL MEDICATIONS USED:  MARCAINE     SPECIMEN:  No Specimen  DISPOSITION OF SPECIMEN:  N/A  COUNTS:  YES  TOURNIQUET:   Total Tourniquet Time Documented: Forearm (Right) - 22 minutes Total: Forearm (Right) - 22 minutes   DICTATION: .Other Dictation: Dictation Number [id number]  PLAN OF CARE: Discharge to home after PACU  PATIENT DISPOSITION:  PACU - hemodynamically stable.

## 2015-12-06 NOTE — Transfer of Care (Signed)
Immediate Anesthesia Transfer of Care Note  Patient: Norma Cameron  Procedure(s) Performed: Procedure(s): RIGHT CARPAL TUNNEL RELEASE (Right)  Patient Location: PACU  Anesthesia Type:Bier block  Level of Consciousness: awake, alert  and oriented  Airway & Oxygen Therapy: Patient Spontanous Breathing  Post-op Assessment: Report given to RN and Post -op Vital signs reviewed and stable  Post vital signs: Reviewed and stable  Last Vitals:  Filed Vitals:   12/06/15 0809  BP: 139/59  Pulse: 59  Temp: 36.7 C  Resp: 20    Complications: No apparent anesthesia complications

## 2015-12-07 ENCOUNTER — Encounter (HOSPITAL_BASED_OUTPATIENT_CLINIC_OR_DEPARTMENT_OTHER): Payer: Self-pay | Admitting: Orthopedic Surgery

## 2016-04-29 ENCOUNTER — Ambulatory Visit (INDEPENDENT_AMBULATORY_CARE_PROVIDER_SITE_OTHER): Payer: 59 | Admitting: Internal Medicine

## 2016-04-29 ENCOUNTER — Encounter: Payer: Self-pay | Admitting: Internal Medicine

## 2016-04-29 VITALS — BP 128/82 | HR 64 | Temp 98.2°F | Resp 16 | Ht 62.0 in | Wt 135.0 lb

## 2016-04-29 DIAGNOSIS — Z136 Encounter for screening for cardiovascular disorders: Secondary | ICD-10-CM | POA: Diagnosis not present

## 2016-04-29 DIAGNOSIS — Z130 Encounter for screening for diseases of the blood and blood-forming organs and certain disorders involving the immune mechanism: Secondary | ICD-10-CM

## 2016-04-29 DIAGNOSIS — Z79899 Other long term (current) drug therapy: Secondary | ICD-10-CM

## 2016-04-29 DIAGNOSIS — Z0000 Encounter for general adult medical examination without abnormal findings: Secondary | ICD-10-CM

## 2016-04-29 DIAGNOSIS — I10 Essential (primary) hypertension: Secondary | ICD-10-CM | POA: Diagnosis not present

## 2016-04-29 DIAGNOSIS — Z1329 Encounter for screening for other suspected endocrine disorder: Secondary | ICD-10-CM

## 2016-04-29 DIAGNOSIS — Z1212 Encounter for screening for malignant neoplasm of rectum: Secondary | ICD-10-CM

## 2016-04-29 DIAGNOSIS — E559 Vitamin D deficiency, unspecified: Secondary | ICD-10-CM

## 2016-04-29 DIAGNOSIS — R7303 Prediabetes: Secondary | ICD-10-CM

## 2016-04-29 DIAGNOSIS — E785 Hyperlipidemia, unspecified: Secondary | ICD-10-CM

## 2016-04-29 DIAGNOSIS — Z0001 Encounter for general adult medical examination with abnormal findings: Secondary | ICD-10-CM

## 2016-04-29 DIAGNOSIS — Z1389 Encounter for screening for other disorder: Secondary | ICD-10-CM

## 2016-04-29 LAB — CBC WITH DIFFERENTIAL/PLATELET
Basophils Absolute: 74 cells/uL (ref 0–200)
Basophils Relative: 1 %
Eosinophils Absolute: 222 cells/uL (ref 15–500)
Eosinophils Relative: 3 %
HCT: 40.2 % (ref 35.0–45.0)
Hemoglobin: 13.5 g/dL (ref 11.7–15.5)
Lymphocytes Relative: 42 %
Lymphs Abs: 3108 cells/uL (ref 850–3900)
MCH: 32.4 pg (ref 27.0–33.0)
MCHC: 33.6 g/dL (ref 32.0–36.0)
MCV: 96.4 fL (ref 80.0–100.0)
MPV: 10.6 fL (ref 7.5–12.5)
Monocytes Absolute: 592 cells/uL (ref 200–950)
Monocytes Relative: 8 %
Neutro Abs: 3404 cells/uL (ref 1500–7800)
Neutrophils Relative %: 46 %
Platelets: 276 10*3/uL (ref 140–400)
RBC: 4.17 MIL/uL (ref 3.80–5.10)
RDW: 14.2 % (ref 11.0–15.0)
WBC: 7.4 10*3/uL (ref 3.8–10.8)

## 2016-04-29 LAB — IRON AND TIBC
%SAT: 23 % (ref 11–50)
Iron: 88 ug/dL (ref 45–160)
TIBC: 376 ug/dL (ref 250–450)
UIBC: 288 ug/dL (ref 125–400)

## 2016-04-29 LAB — BASIC METABOLIC PANEL WITH GFR
BUN: 13 mg/dL (ref 7–25)
CO2: 26 mmol/L (ref 20–31)
Calcium: 9.5 mg/dL (ref 8.6–10.4)
Chloride: 104 mmol/L (ref 98–110)
Creat: 0.75 mg/dL (ref 0.50–1.05)
GFR, Est African American: >89 mL/min (ref >=60)
GFR, Est Non African American: 88 mL/min (ref >=60)
Glucose, Bld: 83 mg/dL (ref 65–99)
Potassium: 4 mmol/L (ref 3.5–5.3)
Sodium: 140 mmol/L (ref 135–146)

## 2016-04-29 LAB — HEPATIC FUNCTION PANEL
ALT: 11 U/L (ref 6–29)
AST: 18 U/L (ref 10–35)
Albumin: 4.4 g/dL (ref 3.6–5.1)
Alkaline Phosphatase: 57 U/L (ref 33–130)
Bilirubin, Direct: 0.1 mg/dL (ref <=0.2)
Indirect Bilirubin: 0.4 mg/dL (ref 0.2–1.2)
Total Bilirubin: 0.5 mg/dL (ref 0.2–1.2)
Total Protein: 6.7 g/dL (ref 6.1–8.1)

## 2016-04-29 LAB — LIPID PANEL
Cholesterol: 204 mg/dL — ABNORMAL HIGH (ref 125–200)
HDL: 81 mg/dL (ref >=46)
LDL Cholesterol: 99 mg/dL (ref <130)
Total CHOL/HDL Ratio: 2.5 Ratio (ref <=5.0)
Triglycerides: 122 mg/dL (ref <150)
VLDL: 24 mg/dL (ref <30)

## 2016-04-29 LAB — HEMOGLOBIN A1C
Hgb A1c MFr Bld: 5.7 % — ABNORMAL HIGH (ref <5.7)
Mean Plasma Glucose: 117 mg/dL

## 2016-04-29 LAB — TSH: TSH: 2.03 mIU/L

## 2016-04-29 LAB — VITAMIN B12: Vitamin B-12: 754 pg/mL (ref 200–1100)

## 2016-04-29 LAB — MAGNESIUM: Magnesium: 2.1 mg/dL (ref 1.5–2.5)

## 2016-04-29 IMAGING — US US ABDOMEN COMPLETE
1 series · 14 of 25 positions shown · non-contrast
Comparison: None.

CLINICAL DATA: Possible abnormal aorta on in-office ultrasound

EXAM:
ULTRASOUND ABDOMEN COMPLETE

[Series 1: us abdomen complete · 0.18mm/px · 14 of 76 slices shown]
[im 1/76]
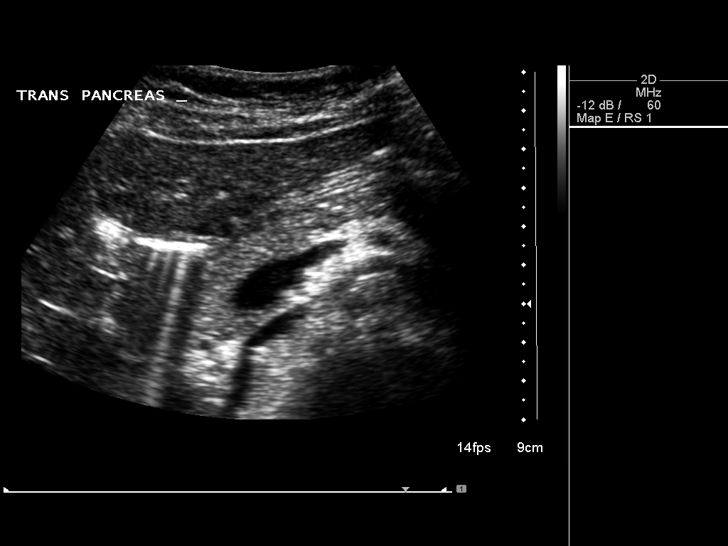
[im 7/76]
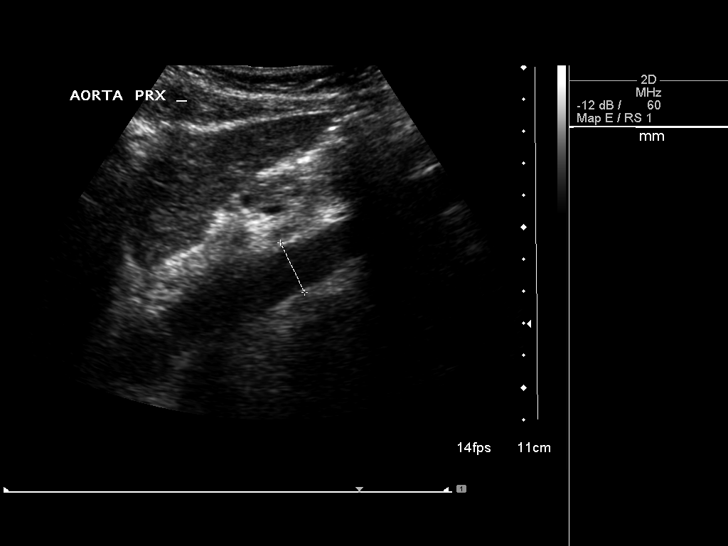
[im 13/76]
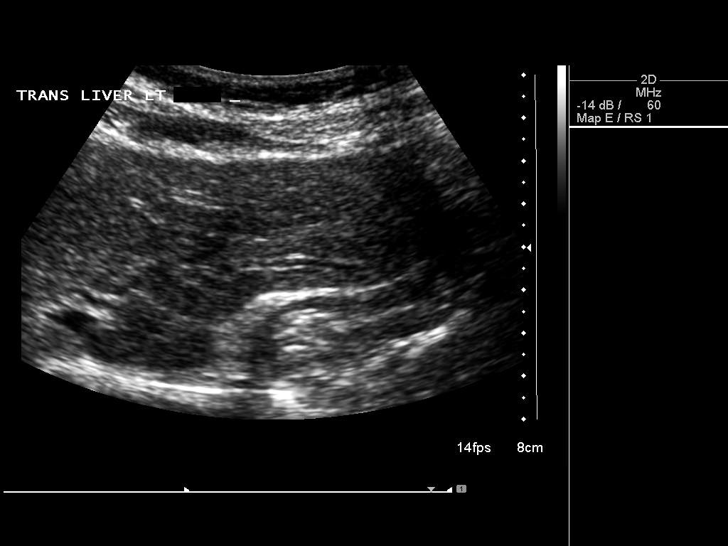
[im 19/76]
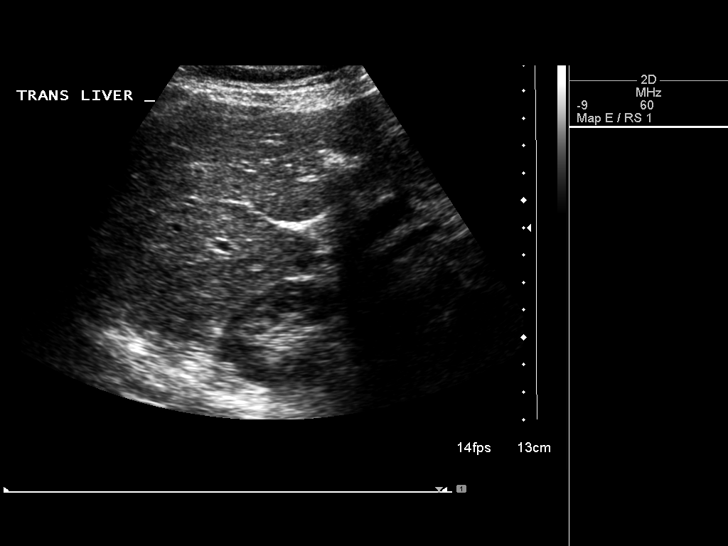
[im 26/76]
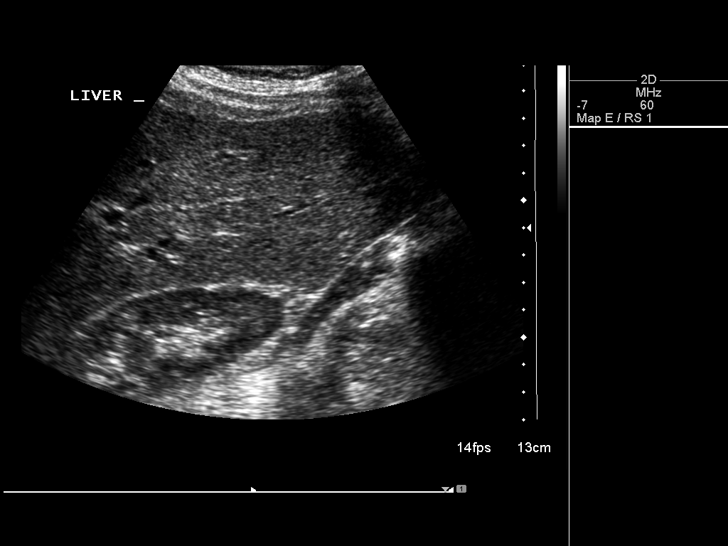
[im 29/76]
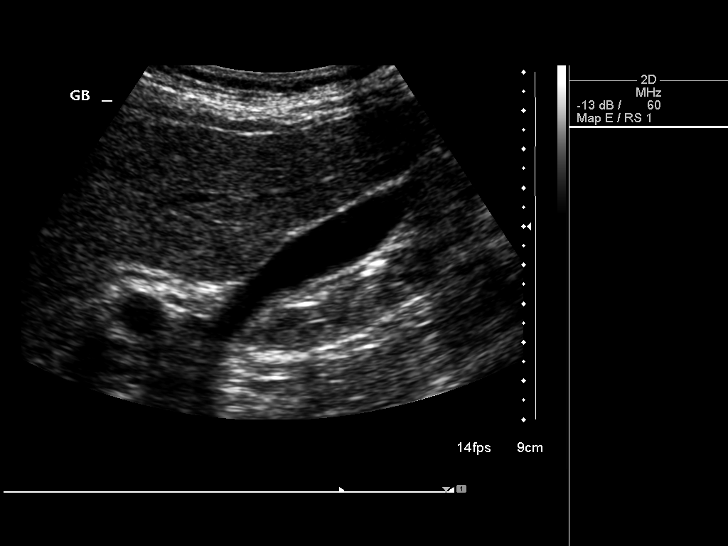
[im 35/76]
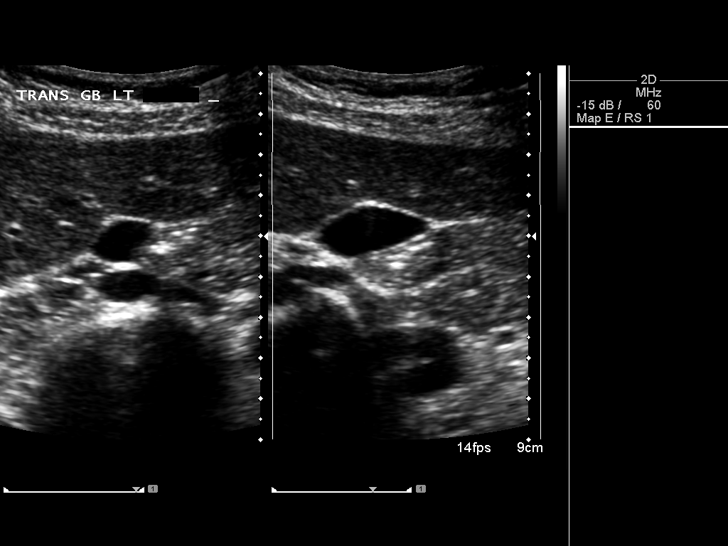
[im 41/76]
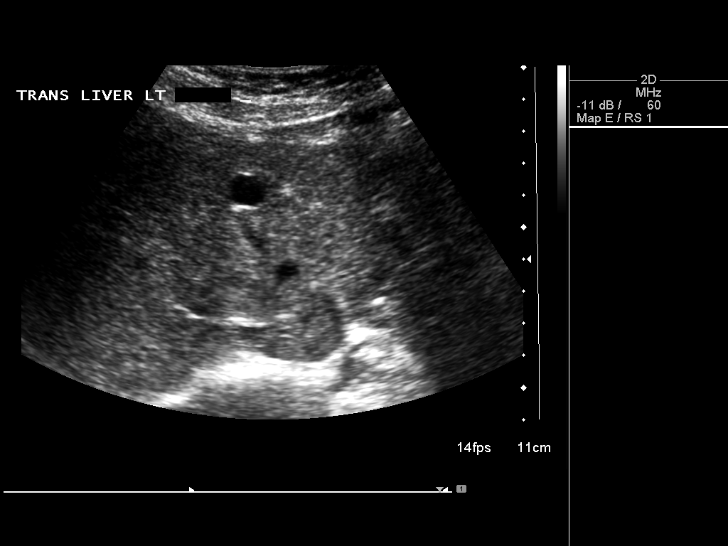
[im 47/76]
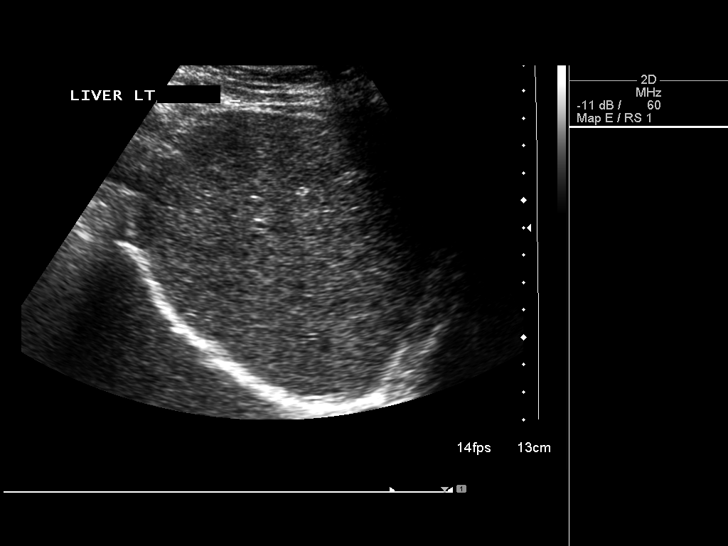
[im 51/76]
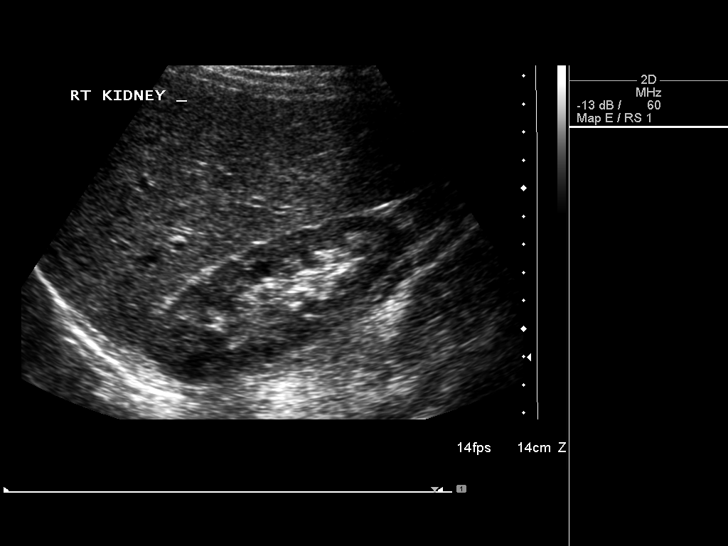
[im 57/76]
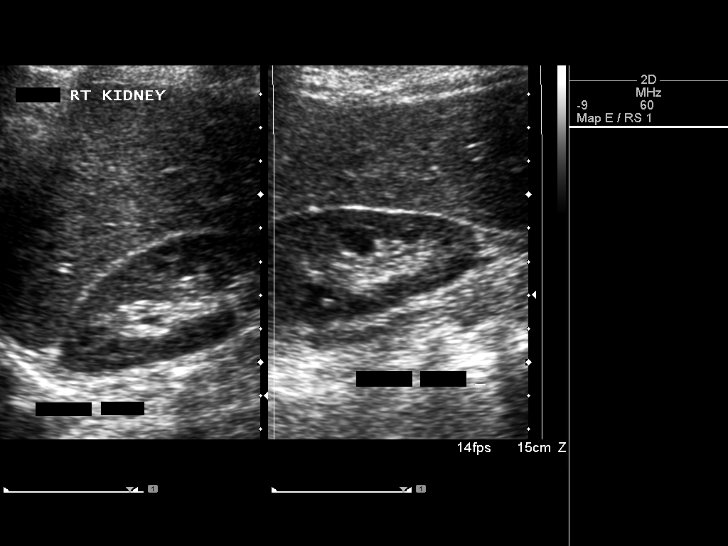
[im 63/76]
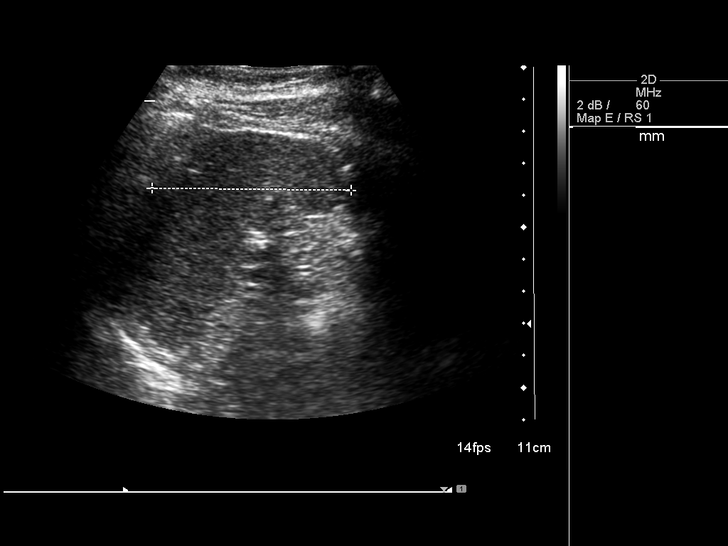
[im 69/76]
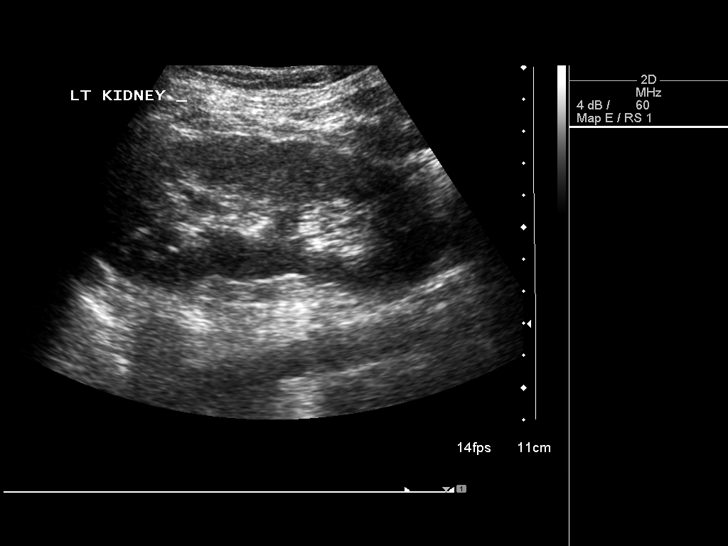
[im 76/76]
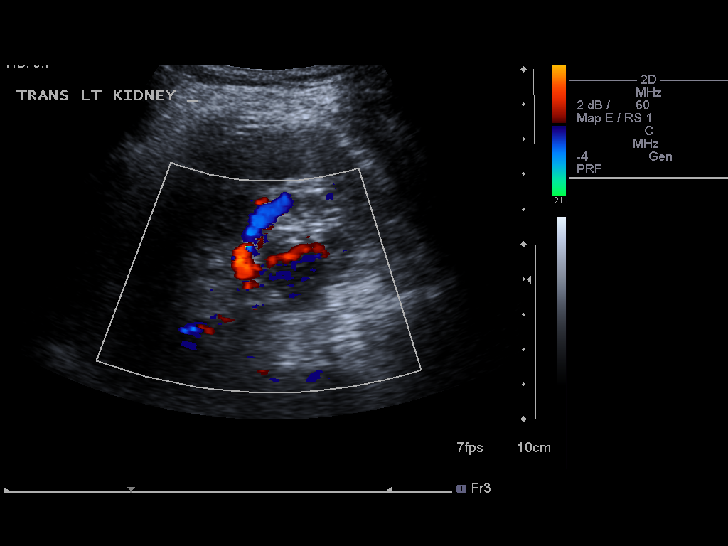

[14 of 25 positions shown; findings below may reference images not displayed]

FINDINGS: Gallbladder:

No gallstones, gallbladder wall thickening, or pericholecystic
fluid. Negative sonographic Murphy's sign.

Common bile duct:

Diameter: 3 mm.

Liver:

12 x 9 x 12 mm cyst in the right hepatic lobe. Within normal limits
in parenchymal echogenicity.

IVC:

No abnormality visualized.

Pancreas:

Visualized portions are unremarkable.

Spleen:

Measures 6.2 cm.

Right Kidney:

Length: 10.6 cm.  No mass or hydronephrosis.

Left Kidney:

Length: 11.5 cm.  No mass or hydronephrosis.

Abdominal aorta:

Measures up to 1.7 cm.  No aneurysm visualized.

Other findings:

None.
IMPRESSION: Negative abdominal ultrasound.

## 2016-04-29 NOTE — Progress Notes (Signed)
Complete Physical  Assessment and Plan:   1. Encounter for general adult medical examination with abnormal findings  - CBC with Differential/Platelet - BASIC METABOLIC PANEL WITH GFR - Hepatic function panel - Magnesium  2. Hyperlipidemia -cont medication - Lipid panel  3. Prediabetes  - Hemoglobin A1c - Insulin, random  4. Vitamin D deficiency -cont supplement - VITAMIN D 25 Hydroxy (Vit-D Deficiency, Fractures)  5. Medication management   6. Screening for deficiency anemia  - Iron and TIBC - Vitamin B12  7. Screening for hematuria or proteinuria  - Urinalysis, Routine w reflex microscopic (not at [facility]) - Microalbumin / creatinine urine ratio  8. Screening for cardiovascular condition  - EKG 12-Lead - [place], RETROPERITNL ABD,  LTD  9. Screening for thyroid disorder  - TSH  10. Screening for rectal cancer  - POC Hemoccult Bld/Stl (3-Cd Home Screen); Future    Discussed med's effects and SE's. Screening labs and tests as requested with regular follow-up as recommended.  HPI  60 y.o. female  presents for a complete physical.  Her blood pressure has been controlled at home, today their BP is BP: 128/82 mmHg.  She does workout. She denies chest pain, shortness of breath, dizziness.   She is on cholesterol medication and denies myalgias. Her cholesterol is at goal. The cholesterol last visit was:  Lab Results  Component Value Date   CHOL 199 11/07/2015   HDL 82 11/07/2015   LDLCALC 101 11/07/2015   TRIG 78 11/07/2015   CHOLHDL 2.4 11/07/2015  .  She has been working on diet and exercise for prediabetes, she is on bASA, she is not on ACE/ARB and denies foot ulcerations, hyperglycemia, hypoglycemia , increased appetite, nausea, paresthesia of the feet, polydipsia, polyuria, visual disturbances, vomiting and weight loss. Last A1C in the office was:  Lab Results  Component Value Date   HGBA1C 5.6 11/07/2015    Patient is on Vitamin D supplement.   Lab  Results  Component Value Date   VD25OH 37 04/30/2015     Patient notes that she is having a lot of hot flashes which is waking her up multiple times a night.  She is having multiple hot flashes during the daytime.   Patient notes that she did have her carpal tunnel surgery and has done really well with it.     Current Medications:  Current Outpatient Prescriptions on File Prior to Visit  Medication Sig Dispense Refill  . ALPRAZolam (XANAX) 0.5 MG tablet Take 0.5 mg by mouth 2 (two) times daily.    . cetirizine (ZYRTEC) 10 MG chewable tablet Chew 10 mg by mouth daily.    . pravastatin (PRAVACHOL) 40 MG tablet Take 1 tablet (40 mg total) by mouth every evening. 90 tablet 1  . Probiotic Product (PROBIOTIC DAILY PO) Take by mouth daily.     No current facility-administered medications on file prior to visit.    Health Maintenance:   Immunization History  Administered Date(s) Administered  . Tdap 02/04/2008  . Zoster 12/28/2012    Tetanus: 2009 Zostavax: 2014 Pap:  7/16 MGM: 2016 DEXA: Due this year with Dr. Nori Riis Colonoscopy: 2009 Last Dental Exam: Dr. Berdine Addison Last Eye Exam: Dr. Nicki Reaper, 2016  Patient Care Team: Unk Pinto, MD as PCP - General (Internal Medicine) Macarthur Critchley, Aquilla as Referring Physician (Optometry) Richmond Campbell, MD as Consulting Physician (Gastroenterology) Viona Gilmore Evette Cristal, MD as Consulting Physician (Obstetrics and Gynecology) Fayette Pho, MD as Referring Physician (Orthopedic Surgery) Lavonna Monarch, MD as Consulting  Physician (Dermatology)  Allergies:  Allergies  Allergen Reactions  . Acyclovir And Related   . Eggs Or Egg-Derived Products Hives    Patient states she eats eggs with no problem.  Received propofol with no reaction.   . Neosporin [Neomycin-Bacitracin Zn-Polymyx] Hives  . Red Yeast Rice [Cholestin] Other (See Comments)    MYALGIA with higher doses  . Sulfa Antibiotics Hives  . Zostavax [Zoster Vaccine Live] Rash    Medical History:   Past Medical History  Diagnosis Date  . Hyperlipidemia   . Vitamin D deficiency   . Allergy   . Depression     CONTROLLED WITH OCCASIONAL XANAX  . Osteopenia   . Fibrocystic breast disease     Surgical History:  Past Surgical History  Procedure Laterality Date  . Dilation and curettage of uterus  2006  . Novasure ablation  2006  . Cervix lesion destruction      EARLY 20'S  . Skin cancer excision Right 10/2011    LOWER EXT BCC  . Carpal tunnel release Right 12/06/2015    Procedure: RIGHT CARPAL TUNNEL RELEASE;  Surgeon: Leanora Cover, MD;  Location: [facility];  Service: Orthopedics;  Laterality: Right;    Family History:  Family History  Problem Relation Age of Onset  . Hypertension Mother   . Hyperlipidemia Mother   . Heart disease Father     CABG  . Hyperlipidemia Father   . Hypertension Father   . Cancer Paternal Uncle     THROAT  . Heart disease Paternal Grandfather   . Diabetes Paternal Grandfather     Social History:  Social History  Substance Use Topics  . Smoking status: Former Smoker    Quit date: 03/13/1990  . Smokeless tobacco: None  . Alcohol Use: No    Review of Systems: Review of Systems  Constitutional: Negative for fever, chills and malaise/fatigue.  HENT: Negative for congestion, ear pain and sore throat.   Respiratory: Negative for cough, shortness of breath and wheezing.   Cardiovascular: Negative for chest pain, palpitations and leg swelling.  Gastrointestinal: Negative for heartburn, abdominal pain, diarrhea, constipation, blood in stool and melena.  Genitourinary: Negative.   Skin: Negative.   Neurological: Negative for dizziness, sensory change, loss of consciousness and headaches.  Psychiatric/Behavioral: Negative for depression. The patient is not nervous/anxious and does not have insomnia.     Physical Exam: Estimated body mass index is 24.69 kg/(m^2) as calculated from the following:   Height as of this  encounter: 5\' 2"  (1.575 m).   Weight as of this encounter: 135 lb (61.236 kg). BP 128/82 mmHg  Pulse 64  Temp(Src) 98.2 F (36.8 C) (Temporal)  Resp 16  Ht 5\' 2"  (1.575 m)  Wt 135 lb (61.236 kg)  BMI 24.69 kg/m2  General Appearance: Well nourished well developed, in no apparent distress.  Eyes: PERRLA, EOMs, conjunctiva no swelling or erythema ENT/Mouth: Ear canals normal without obstruction, swelling, erythema, or discharge.  TMs normal bilaterally with no erythema, bulging, retraction, or loss of landmark.  Oropharynx moist and clear with no exudate, erythema, or swelling.   Neck: Supple, thyroid normal. No bruits.  No cervical adenopathy Respiratory: Respiratory effort normal, Breath sounds clear A&P without wheeze, rhonchi, rales.   Cardio: RRR without murmurs, rubs or gallops. Brisk peripheral pulses without edema.  Chest: symmetric, with normal excursions Breasts: Symmetric, without lumps, nipple discharge, retractions.  Abdomen: Soft, nontender, no guarding, rebound, hernias, masses, or organomegaly.  Lymphatics: Non tender without  lymphadenopathy.  Musculoskeletal: Full ROM all peripheral extremities,5/5 strength, and normal gait.  Skin: Warm, dry without rashes, lesions, ecchymosis. Neuro: Awake and oriented X 3, Cranial nerves intact, reflexes equal bilaterally. Normal muscle tone, no cerebellar symptoms. Sensation intact.  Psych:  normal affect, Insight and Judgment appropriate.   EKG: WNL no changes.  AORTA SCAN: WNL   Over 40 minutes of exam, counseling, chart review and critical decision making was performed  Loma Sousa Forcucci 2:24 PM [facility]

## 2016-04-29 NOTE — Patient Instructions (Signed)
Black Cohosh, Cimicifuga racemosa oral dosage forms What is this medicine? BLACK COHOSH (blak KOH hosh) or Cimicifuga racemosa is a dietary supplement. It is being promoted to help support female health problems, like the symptoms of menopause (hot flashes). Black cohosh is also promoted to ease menstrual pain or pre-menstrual syndrome (PMS). The FDA does not recognize black cohosh as being safe or effective for any use at this time, and warns against its use in pregnancy. This supplement may be used for other purposes; ask your health care provider or pharmacist if you have questions. This medicine may be used for other purposes; ask your health care provider or pharmacist if you have questions. What should I tell my health care provider before I take this medicine? They need to know if you have any of these conditions: -cancer -endometriosis or uterine fibroids -high blood pressure -infertility -kidney disease -liver disease -menstrual changes or irregular periods -unusual vaginal or uterine bleeding -an unusual or allergic reaction to black cohosh, soybeans, tartrazine dye (yellow dye number 5), other medicines, foods, dyes, or preservatives -pregnant or trying to get pregnant -breast-feeding How should I use this medicine? Take this herb by mouth with a glass of water. Follow the directions on the package labeling, or talk to your health care professional. Do not use for longer than 6 months without the advice of a health care professional. Do not use if you are pregnant or breast-feeding. Talk to your obstetrician-gynecologist or certified nurse-midwife. This herb is not for use in children under the age of 18 years. Overdosage: If you think you have taken too much of this medicine contact a poison control center or emergency room at once. NOTE: This medicine is only for you. Do not share this medicine with others. What if I miss a dose? If you miss a dose, take it as soon as you can. If  it is almost time for your next dose, take only that dose. Do not take double or extra doses. What may interact with this medicine? -female hormones, like estrogens or progestins and birth control pills -fertility treatments -medicines for blood pressure -medicines for diabetes This list may not describe all possible interactions. Give your health care provider a list of all the medicines, herbs, non-prescription drugs, or dietary supplements you use. Also tell them if you smoke, drink alcohol, or use illegal drugs. Some items may interact with your medicine. What should I watch for while using this medicine? Since this herb is derived from a plant, allergic reactions are possible. Stop using this herb if you develop a rash. You may need to see your health care professional, or inform them that this occurred. Report any unusual side effects promptly. If you are taking this herb for menstrual or menopausal symptoms, visit your doctor or health care professional for regular checks on your progress. You should have a complete check-up every 6 months. You will need a regular breast and pelvic exam while on this therapy. Follow the advice of your doctor or health care professional. If you have any reason to think you are pregnant, stop taking this herb at once and contact your doctor or health care professional. Herbal or dietary supplements are not regulated like medicines. Rigid quality control standards are not required for dietary supplements. The purity and strength of these products can vary. The safety and effect of this dietary supplement for a certain disease or illness is not well known. This product is not intended to diagnose, treat, cure or   prevent any disease. The Food and Drug Administration suggests the following to help consumers protect themselves: -Always read product labels and follow directions. -Natural does not mean a product is safe for humans to take. -Look for products that  include USP after the ingredient name. This means that the manufacturer followed the standards of the [place] Pharmacopoeia. -Supplements made or sold by a nationally known food or drug company are more likely to be made under tight controls. You can write to the company for more information about how the product was made. What side effects may I notice from receiving this medicine? Side effects that you should report to your doctor or health care professional as soon as possible: -allergic reactions like skin rash, itching or hives, swelling of the face, lips, or tongue -difficulty breathing, shortness of breath, or wheezing -easy bruising -fast heartbeat, slow heartbeat, or palpitations -headache -high blood pressure -severe nausea or vomiting -unusually weak or tired Side effects that usually do not require medical attention (report to your doctor or health care professional if they continue or are bothersome): -heartburn -mild upset stomach This list may not describe all possible side effects. Call your doctor for medical advice about side effects. You may report side effects to FDA at 1-800-FDA-1088. Where should I keep my medicine? Keep out of the reach of children. Store at room temperature between 15 and 30 degrees C (59 and 86 degrees C). Throw away any unused herb after the expiration date. NOTE: This sheet is a summary. It may not cover all possible information. If you have questions about this medicine, talk to your doctor, pharmacist, or health care provider.    2016, Elsevier/Gold Standard. (2008-02-24 13:29:09)  Vortioxetine oral tablet What is this medicine? Vortioxetine (vor tee [employer] e teen) is used to treat depression. This medicine may be used for other purposes; ask your health care provider or pharmacist if you have questions. What should I tell my health care provider before I take this medicine? They need to know if you have any of these conditions: -bipolar disorder or  a family history of bipolar disorder -bleeding disorders -drink alcohol -glaucoma -liver disease -low levels of sodium in the blood -seizures -suicidal thoughts, plans, or attempt; a previous suicide attempt by you or a family member -take medicines that treat or prevent blood clots -an unusual or allergic reaction to vortioxetine, other medicines, foods, dyes, or preservatives -pregnant or trying to get pregnant -breast-feeding How should I use this medicine? Take this medicine by mouth with a glass of water. Follow the directions on the prescription label. You can take it with or without food. If it upsets your stomach, take it with food. Take your medicine at regular intervals. Do not take it more often than directed. Do not stop taking this medicine suddenly except upon the advice of your doctor. Stopping this medicine too quickly may cause serious side effects or your condition may worsen. A special MedGuide will be given to you by the pharmacist with each prescription and refill. Be sure to read this information carefully each time. Talk to your pediatrician regarding the use of this medicine in children. Special care may be needed. Overdosage: If you think you have taken too much of this medicine contact a poison control center or emergency room at once. NOTE: This medicine is only for you. Do not share this medicine with others. What if I miss a dose? If you miss a dose, take it as soon as you can.  If it is almost time for your next dose, take only that dose. Do not take double or extra doses. What may interact with this medicine? Do not take this medicine with any of the following medications: -linezolid -MAOIs like Carbex, Eldepryl, Marplan, Nardil, and Parnate -methylene blue (injected into a vein) This medicine may also interact with the following medications: -alcohol -aspirin and aspirin-like medicines -carbamazepine -certain medicines for depression, anxiety, or psychotic  disturbances -certain medicines for migraine headache like almotriptan, eletriptan, frovatriptan, naratriptan, rizatriptan, sumatriptan, zolmitriptan -diuretics -fentanyl -furazolidone -isoniazid -medicines that treat or prevent blood clots like warfarin, enoxaparin, and dalteparin -NSAIDs, medicines for pain and inflammation, like ibuprofen or naproxen -phenytoin -procarbazine -quinidine -rasagiline -rifampin -supplements like St. John's wort, kava kava, valerian -tramadol -tryptophan This list may not describe all possible interactions. Give your health care provider a list of all the medicines, herbs, non-prescription drugs, or dietary supplements you use. Also tell them if you smoke, drink alcohol, or use illegal drugs. Some items may interact with your medicine. What should I watch for while using this medicine? Tell your doctor if your symptoms do not get better or if they get worse. Visit your doctor or health care professional for regular checks on your progress. Because it may take several weeks to see the full effects of this medicine, it is important to continue your treatment as prescribed by your doctor. Patients and their families should watch out for new or worsening thoughts of suicide or depression. Also watch out for sudden changes in feelings such as feeling anxious, agitated, panicky, irritable, hostile, aggressive, impulsive, severely restless, overly excited and hyperactive, or not being able to sleep. If this happens, especially at the beginning of treatment or after a change in dose, call your health care professional. Dennis Bast may get drowsy or dizzy. Do not drive, use machinery, or do anything that needs mental alertness until you know how this medicine affects you. Do not stand or sit up quickly, especially if you are an older patient. This reduces the risk of dizzy or fainting spells. Alcohol may interfere with the effect of this medicine. Avoid alcoholic drinks. Your  mouth may get dry. Chewing sugarless gum or sucking hard candy, and drinking plenty of water may help. Contact your doctor if the problem does not go away or is severe. What side effects may I notice from receiving this medicine? Side effects that you should report to your doctor or health care professional as soon as possible: -allergic reactions like skin rash, itching or hives, swelling of the face, lips, or tongue -confusion -fast talking and excited feelings or actions that are out of control -feeling faint or lightheaded, falls -hallucination, loss of contact with reality -seizures -suicidal thoughts or other mood changes -unusual bleeding or bruising -unusual body movements or muscle twitching -weakness Side effects that usually do not require medical attention (Report these to your doctor or health care professional if they continue or are bothersome.): -change in sex drive or performance -constipation -diarrhea -dizziness -dry mouth -nausea This list may not describe all possible side effects. Call your doctor for medical advice about side effects. You may report side effects to FDA at 1-800-FDA-1088. Where should I keep my medicine? Keep out of the reach of children. Store at room temperature between 15 and 30 degrees C (59 and 86 degrees F). Throw away any unused medicine after the expiration date. NOTE: This sheet is a summary. It may not cover all possible information. If you have questions  about this medicine, talk to your doctor, pharmacist, or health care provider.    2016, Elsevier/Gold Standard. (2013-06-17 13:22:25)  Paroxetine capsules What is this medicine? PAROXETINE (pa ROX e teen) is used to treat hot flashes due to menopause. This medicine may be used for other purposes; ask your health care provider or pharmacist if you have questions. What should I tell my health care provider before I take this medicine? They need to know if you have any of these  conditions: -bleeding disorders -glaucoma -heart disease -kidney disease -liver disease -low levels of sodium in the blood -mania or bipolar disorder -seizures -suicidal thoughts, plans, or attempt; a previous suicide attempt by you or a family member -take MAOIs like Carbex, Eldepryl, Marplan, Nardil, and Parnate -take medicines that treat or prevent blood clots -an unusual or allergic reaction to paroxetine, other medicines, foods, dyes, or preservatives -pregnant or trying to get pregnant -breast-feeding How should I use this medicine? Take this medicine by mouth once daily at bedtime. Follow the directions on the prescription label. This medicine can be taken with or without food. Take your medicine at regular intervals. Do not take your medicine more often than directed. A special MedGuide will be given to you by the pharmacist with each prescription and refill. Be sure to read this information carefully each time. Overdosage: If you think you have taken too much of this medicine contact a poison control center or emergency room at once. NOTE: This medicine is only for you. Do not share this medicine with others. What if I miss a dose? If you miss a dose, take it as soon as you can. If it is almost time for your next dose, take only that dose. Do not take double or extra doses. What may interact with this medicine? Do not take this medicine with any of the following medications: -linezolid -MAOIs like Carbex, Eldepryl, Marplan, Nardil, and Parnate -methylene blue (injected into a vein) -pimozide -thioridazine This medicine may also interact with the following medications: -alcohol -aspirin and aspirin-like medicines -atomoxetine -certain medicines for depression, anxiety, or psychotic disturbances -certain medicines for irregular heart beat like propafenone, flecainide, encainide, and quinidine -certain medicines for migraine headache like almotriptan, eletriptan,  frovatriptan, naratriptan, rizatriptan, sumatriptan, zolmitriptan -cimetidine -digoxin -diuretics -fentanyl -fosamprenavir -furazolidone -isoniazid -lithium -medicines that treat or prevent blood clots like warfarin, enoxaparin, and dalteparin -medicines for sleep -NSAIDs, medicines for pain and inflammation, like ibuprofen or naproxen -phenobarbital -phenytoin -procarbazine -rasagiline -ritonavir -supplements like St. John's wort, kava kava, valerian -tamoxifen -tramadol -tryptophan This list may not describe all possible interactions. Give your health care provider a list of all the medicines, herbs, non-prescription drugs, or dietary supplements you use. Also tell them if you smoke, drink alcohol, or use illegal drugs. Some items may interact with your medicine. What should I watch for while using this medicine? Tell your doctor or healthcare professional if your symptoms do not start to get better or if they get worse. Visit your doctor or health care professional for regular checks on your progress. Patients and their families should watch out for new or worsening thoughts of suicide or depression. Also watch out for sudden changes in feelings such as feeling anxious, agitated, panicky, irritable, hostile, aggressive, impulsive, severely restless, overly excited and hyperactive, or not being able to sleep. If this happens, especially at the beginning of treatment or after a change in dose, call your health care professional. Dennis Bast may get drowsy or dizzy. Do not drive, use machinery,  or do anything that needs mental alertness until you know how this medicine affects you. Do not stand or sit up quickly, especially if you are an older patient. This reduces the risk of dizzy or fainting spells. Alcohol may interfere with the effect of this medicine. Avoid alcoholic drinks. Your mouth may get dry. Chewing sugarless gum, sucking hard candy and drinking plenty of water will help. Contact your  doctor if the problem does not go away or is severe. Women should inform their doctor if they wish to become pregnant or think they might be pregnant. There is a potential for serious side effects to an unborn child. Talk to your health care professional or pharmacist for more information. Do not become pregnant while taking this medicine. What side effects may I notice from receiving this medicine? Side effects that you should report to your doctor or health care professional as soon as possible: -allergic reactions like skin rash, itching or hives, swelling of the face, lips, or tongue -changes in emotions or moods -confusion -depression -feeling faint or lightheaded, falls -seizures -suicidal thoughts or actions -unusual bleeding or bruising -unusually weak or tired -weakness Side effects that usually do not require medical attention (Report these to your doctor or health care professional if they continue or are bothersome.): -change in sex drive or performance -fatigue -drowsiness -headache -insomnia -nausea/vomiting -upset stomach This list may not describe all possible side effects. Call your doctor for medical advice about side effects. You may report side effects to FDA at 1-800-FDA-1088. Where should I keep my medicine? Keep out of the reach of children. Store at room temperature between 20 and 25 degrees C (68 and 77 degrees F). Throw away any unused medicine after the expiration date. NOTE: This sheet is a summary. It may not cover all possible information. If you have questions about this medicine, talk to your doctor, pharmacist, or health care provider.    2016, Elsevier/Gold Standard. (2012-07-26 12:26:17)

## 2016-04-30 LAB — URINALYSIS, ROUTINE W REFLEX MICROSCOPIC
Bilirubin Urine: NEGATIVE
Glucose, UA: NEGATIVE
Hgb urine dipstick: NEGATIVE
Ketones, ur: NEGATIVE
Nitrite: NEGATIVE
Protein, ur: NEGATIVE
Specific Gravity, Urine: 1.013 (ref 1.001–1.035)
pH: 7.5 (ref 5.0–8.0)

## 2016-04-30 LAB — MICROALBUMIN / CREATININE URINE RATIO
Creatinine, Urine: 46 mg/dL (ref 20–320)
Microalb Creat Ratio: 7 mcg/mg creat (ref <30)
Microalb, Ur: 0.3 mg/dL

## 2016-04-30 LAB — URINALYSIS, MICROSCOPIC ONLY
Bacteria, UA: NONE SEEN [HPF]
Casts: NONE SEEN [LPF]
Crystals: NONE SEEN [HPF]
RBC / HPF: NONE SEEN RBC/HPF (ref <=2)
Yeast: NONE SEEN [HPF]

## 2016-04-30 LAB — INSULIN, RANDOM: Insulin: 3.2 u[IU]/mL (ref 2.0–19.6)

## 2016-04-30 LAB — VITAMIN D 25 HYDROXY (VIT D DEFICIENCY, FRACTURES): Vit D, 25-Hydroxy: 40 ng/mL (ref 30–100)

## 2016-08-05 ENCOUNTER — Ambulatory Visit (INDEPENDENT_AMBULATORY_CARE_PROVIDER_SITE_OTHER): Payer: 59 | Admitting: Internal Medicine

## 2016-08-05 ENCOUNTER — Encounter: Payer: Self-pay | Admitting: Internal Medicine

## 2016-08-05 VITALS — BP 138/80 | HR 70 | Temp 98.2°F | Resp 16 | Ht 62.0 in | Wt 134.0 lb

## 2016-08-05 DIAGNOSIS — M129 Arthropathy, unspecified: Secondary | ICD-10-CM | POA: Diagnosis not present

## 2016-08-05 DIAGNOSIS — M19071 Primary osteoarthritis, right ankle and foot: Secondary | ICD-10-CM

## 2016-08-05 MED ORDER — MELOXICAM 15 MG PO TABS: 15.0000 mg | ORAL_TABLET | Freq: Every day | ORAL | 0 refills | Status: DC

## 2016-08-05 NOTE — Progress Notes (Signed)
   Subjective:    Patient ID: Norma Cameron, female    DOB: [birth year], 60 y.o.   MRN: [id number]  HPI  Patient presents to the office for evaluation of right foot pain x 1-2 weeks.  It has been swolllen and painful with flexion of the foot.  No injury that she can recall.  She has had some relief with ibuprofen.  She has used ice with no real change.  She has also been elevating it.     Review of Systems  Constitutional: Negative for chills and fatigue.  Musculoskeletal: Positive for arthralgias and joint swelling. Negative for myalgias, neck pain and neck stiffness.  Neurological: Negative for dizziness, weakness and numbness.       Objective:   Physical Exam  Constitutional: She is oriented to person, place, and time. She appears well-developed and well-nourished. No distress.  HENT:  Head: Normocephalic.  Mouth/Throat: Oropharynx is clear and moist. No oropharyngeal exudate.  Eyes: Conjunctivae are normal. No scleral icterus.  Neck: Normal range of motion. Neck supple. No JVD present. No thyromegaly present.  Musculoskeletal: Normal range of motion.       Right ankle: She exhibits normal range of motion, no swelling, no ecchymosis, no deformity, no laceration and normal pulse. No lateral malleolus, no medial malleolus, no AITFL, no CF ligament, no posterior TFL, no head of 5th metatarsal and no proximal fibula tenderness found.       Feet:  Lymphadenopathy:    She has no cervical adenopathy.  Neurological: She is alert and oriented to person, place, and time.  Skin: Skin is warm and dry. She is not diaphoretic.  Psychiatric: She has a normal mood and affect. Her behavior is normal. Judgment and thought content normal.  Nursing note and vitals reviewed.   Vitals:   08/05/16 1349  BP: 138/80  Pulse: 70  Resp: 16  Temp: 98.2 F (36.8 C)         Assessment & Plan:    1. Arthritis of right foot -meloxicam -closed toed shoes -heat -avoid uneven surfaces.

## 2016-09-23 ENCOUNTER — Encounter: Payer: Self-pay | Admitting: Physician Assistant

## 2016-09-23 ENCOUNTER — Ambulatory Visit (INDEPENDENT_AMBULATORY_CARE_PROVIDER_SITE_OTHER): Payer: 59 | Admitting: Physician Assistant

## 2016-09-23 VITALS — BP 120/74 | HR 70 | Temp 97.7°F | Resp 16 | Ht 62.0 in | Wt 135.6 lb

## 2016-09-23 DIAGNOSIS — J0100 Acute maxillary sinusitis, unspecified: Secondary | ICD-10-CM | POA: Diagnosis not present

## 2016-09-23 MED ORDER — AZITHROMYCIN 250 MG PO TABS: ORAL_TABLET | ORAL | 1 refills | Status: AC

## 2016-09-23 MED ORDER — PREDNISONE 20 MG PO TABS: ORAL_TABLET | ORAL | 0 refills | Status: DC

## 2016-09-23 NOTE — Progress Notes (Signed)
   Subjective:    Patient ID: Norma Cameron, female    DOB: [birth year], 60 y.o.   MRN: [id number]  HPI 60 y.o. WF presents with cold symptoms x 1 week. Has tried benadryl and ibuprofen and now on sinus-off, on cetrizine. Has had chills, but no known fever. + sinus congestion, teeth pain, ear fullness, cough non productive, no SOB, CP, wheezing.   Blood pressure 120/74, pulse 70, temperature 97.7 F (36.5 C), resp. rate 16, height 5\' 2"  (1.575 m), weight 135 lb 9.6 oz (61.5 kg), SpO2 98 %.  Medications Current Outpatient Prescriptions on File Prior to Visit  Medication Sig  . ALPRAZolam (XANAX) 0.5 MG tablet Take 0.5 mg by mouth 2 (two) times daily.  . calcium citrate-vitamin D 500-400 MG-UNIT chewable tablet Chew by mouth daily.  . cetirizine (ZYRTEC) 10 MG chewable tablet Chew 10 mg by mouth daily.  . Magnesium 400 MG TABS Take 400 mg by mouth daily.  . meloxicam (MOBIC) 15 MG tablet Take 1 tablet (15 mg total) by mouth daily.  . Multiple Vitamins-Calcium (ONE-A-DAY WOMENS PO) Take by mouth daily.  . pravastatin (PRAVACHOL) 40 MG tablet Take 1 tablet (40 mg total) by mouth every evening.  . Probiotic Product (PROBIOTIC DAILY PO) Take by mouth daily.   No current facility-administered medications on file prior to visit.     Problem list She has Hyperlipidemia; Vitamin D deficiency; Allergy; Depression; Medication management; Prediabetes; and Climacteric on her problem list.  Review of Systems  Constitutional: Negative for chills and diaphoresis.  HENT: Positive for congestion, postnasal drip, sinus pressure and sneezing. Negative for ear pain and sore throat.   Respiratory: Positive for cough. Negative for chest tightness, shortness of breath and wheezing.   Cardiovascular: Negative.   Gastrointestinal: Negative.   Genitourinary: Negative.   Musculoskeletal: Negative for neck pain.  Neurological: Positive for headaches.       Objective:   Physical Exam  Constitutional: She  appears well-developed and well-nourished.  HENT:  Head: Normocephalic and atraumatic.  Right Ear: External ear normal.  Nose: Right sinus exhibits maxillary sinus tenderness. Right sinus exhibits no frontal sinus tenderness. Left sinus exhibits maxillary sinus tenderness. Left sinus exhibits no frontal sinus tenderness.  Eyes: Conjunctivae and EOM are normal.  Neck: Normal range of motion. Neck supple.  Cardiovascular: Normal rate, regular rhythm, normal heart sounds and intact distal pulses.   Pulmonary/Chest: Effort normal and breath sounds normal. No respiratory distress. She has no wheezes.  Abdominal: Soft. Bowel sounds are normal.  Lymphadenopathy:    She has cervical adenopathy.  Skin: Skin is warm and dry.      Assessment & Plan:  1. Acute non-recurrent maxillary sinusitis - predniSONE (DELTASONE) 20 MG tablet; 2 tablets daily for 3 days, 1 tablet daily for 4 days.  Dispense: 10 tablet; Refill: 0 - azithromycin (ZITHROMAX) 250 MG tablet; Take 2 tablets (500 mg) on  Day 1,  followed by 1 tablet (250 mg) once daily on Days 2 through 5.  Dispense: 6 each; Refill: 1

## 2016-09-23 NOTE — Patient Instructions (Signed)
Please take the prednisone to help decrease inflammation and therefore decrease symptoms. Take it it with food to avoid GI upset. It can cause increased energy but on the other hand it can make it hard to sleep at night so please take it AT [place], it takes 8-12 hours to start working so it will NOT affect your sleeping if you take it at night with your food!!  If you are diabetic it will increase your sugars so decrease carbs and monitor your sugars closely.    This is NOT an antibiotic, you can take 1-2 days of it and stop it!!  HOW TO TREAT VIRAL COUGH AND COLD SYMPTOMS:  -Symptoms usually last at least 1 week with the worst symptoms being around day 4.  - colds usually start with a sore throat and end with a cough, and the cough can take 2 weeks to get better.  -No antibiotics are needed for colds, flu, sore throats, cough, bronchitis UNLESS symptoms are longer than 7 days OR if you are getting better then get drastically worse.  -There are a lot of combination medications (Dayquil, Nyquil, Vicks 44, tyelnol cold and sinus, ETC). Please look at the ingredients on the back so that you are treating the correct symptoms and not doubling up on medications/ingredients.    Medicines you can use  Nasal congestion  - pseudoephedrine (Sudafed)- behind the counter, do not use if you have high blood pressure, medicine that have -D in them.  - phenylephrine (Sudafed PE) -Dextormethorphan + chlorpheniramine (Coridcidin HBP)- okay if you have high blood pressure -Oxymetazoline (Afrin) nasal spray- LIMIT to 3 days -Saline nasal spray -Neti pot (used distilled or bottled water)  Ear pain/congestion  -pseudoephedrine (sudafed) - Nasonex/flonase nasal spray  Fever  -Acetaminophen (Tyelnol) -Ibuprofen (Advil, motrin, aleve)  Sore Throat  -Acetaminophen (Tyelnol) -Ibuprofen (Advil, motrin, aleve) -Drink a lot of water -Gargle with salt water - Rest your voice (don't talk) -Throat  sprays -Cough drops  Body Aches  -Acetaminophen (Tyelnol) -Ibuprofen (Advil, motrin, aleve)  Headache  -Acetaminophen (Tyelnol) -Ibuprofen (Advil, motrin, aleve) - Exedrin, Exedrin Migraine  Allergy symptoms (cough, sneeze, runny nose, itchy eyes) -Claritin or loratadine cheapest but likely the weakest  -Zyrtec or certizine at night because it can make you sleepy -The strongest is allegra or fexafinadine  Cheapest at [employer], [employer], [employer]  Cough  -Dextromethorphan (Delsym)- medicine that has DM in it -Guafenesin (Mucinex/Robitussin) - cough drops - drink lots of water  Chest Congestion  -Guafenesin (Mucinex/Robitussin)  Red Itchy Eyes  - Naphcon-A  Upset Stomach  - Bland diet (nothing spicy, greasy, fried, and high acid foods like tomatoes, oranges, berries) -OKAY- cereal, bread, soup, crackers, rice -Eat smaller more frequent meals -reduce caffeine, no alcohol -Loperamide (Imodium-AD) if diarrhea -Prevacid for heart burn  General health when sick  -Hydration -wash your hands frequently -keep surfaces clean -change pillow cases and sheets often -Get fresh air but do not exercise strenuously -Vitamin D, double up on it - Vitamin C -Zinc

## 2016-11-03 ENCOUNTER — Encounter: Payer: Self-pay | Admitting: Internal Medicine

## 2016-11-03 ENCOUNTER — Ambulatory Visit (INDEPENDENT_AMBULATORY_CARE_PROVIDER_SITE_OTHER): Payer: 59 | Admitting: Internal Medicine

## 2016-11-03 VITALS — BP 134/80 | HR 64 | Temp 98.2°F | Resp 16 | Ht 62.0 in | Wt 135.0 lb

## 2016-11-03 DIAGNOSIS — E559 Vitamin D deficiency, unspecified: Secondary | ICD-10-CM

## 2016-11-03 DIAGNOSIS — Z79899 Other long term (current) drug therapy: Secondary | ICD-10-CM | POA: Diagnosis not present

## 2016-11-03 DIAGNOSIS — F341 Dysthymic disorder: Secondary | ICD-10-CM

## 2016-11-03 DIAGNOSIS — R7303 Prediabetes: Secondary | ICD-10-CM

## 2016-11-03 DIAGNOSIS — E782 Mixed hyperlipidemia: Secondary | ICD-10-CM | POA: Diagnosis not present

## 2016-11-03 DIAGNOSIS — T7840XA Allergy, unspecified, initial encounter: Secondary | ICD-10-CM

## 2016-11-03 IMAGING — US US BREAST*L* LIMITED INC AXILLA
1 series · 8 of 8 positions shown · non-contrast
Comparison: With priors.

CLINICAL DATA: Patient was called back from screening
mammogram/tomogram for a mass in the left breast.

EXAM:
ULTRASOUND OF THE LEFT BREAST

[Series 1: us breast*left* limited inc axilla · 8 of 8 slices shown]
[im 1/8]
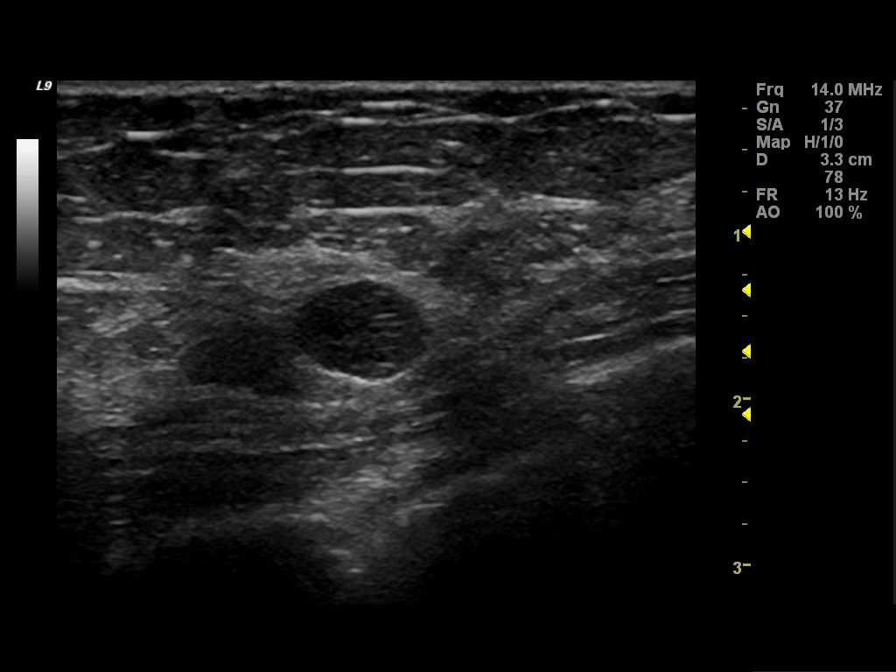
[im 2/8]
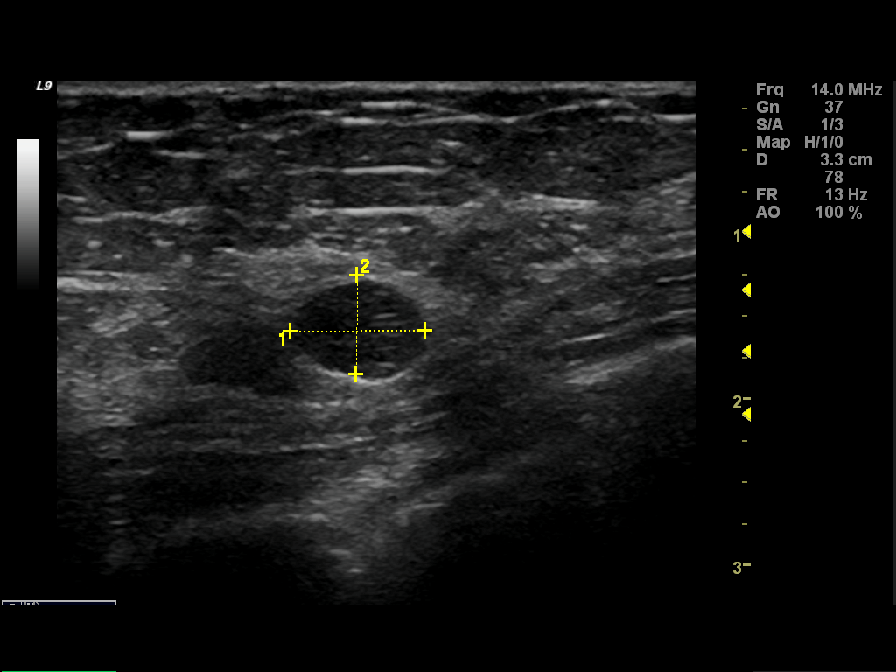
[im 3/8]
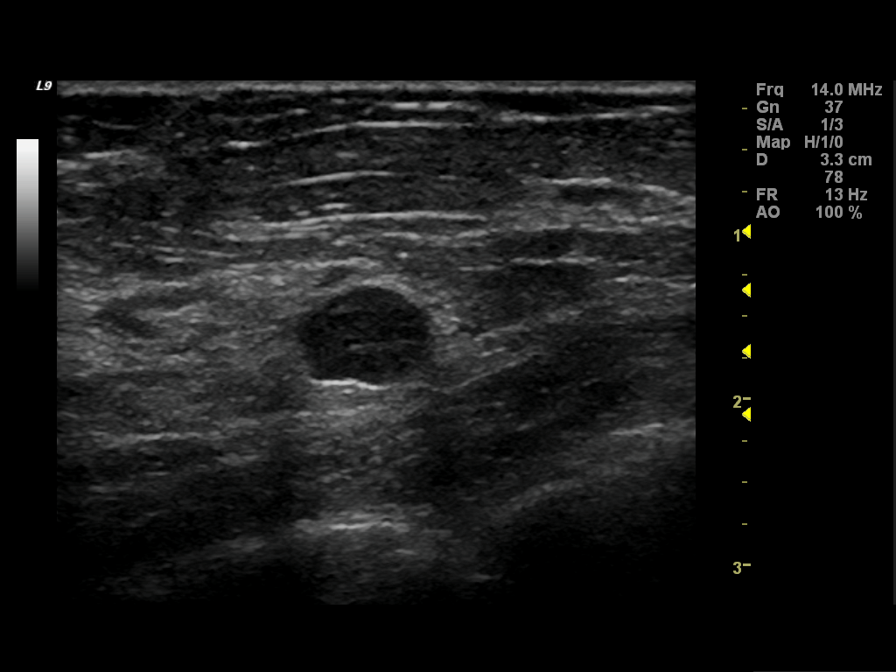
[im 4/8]
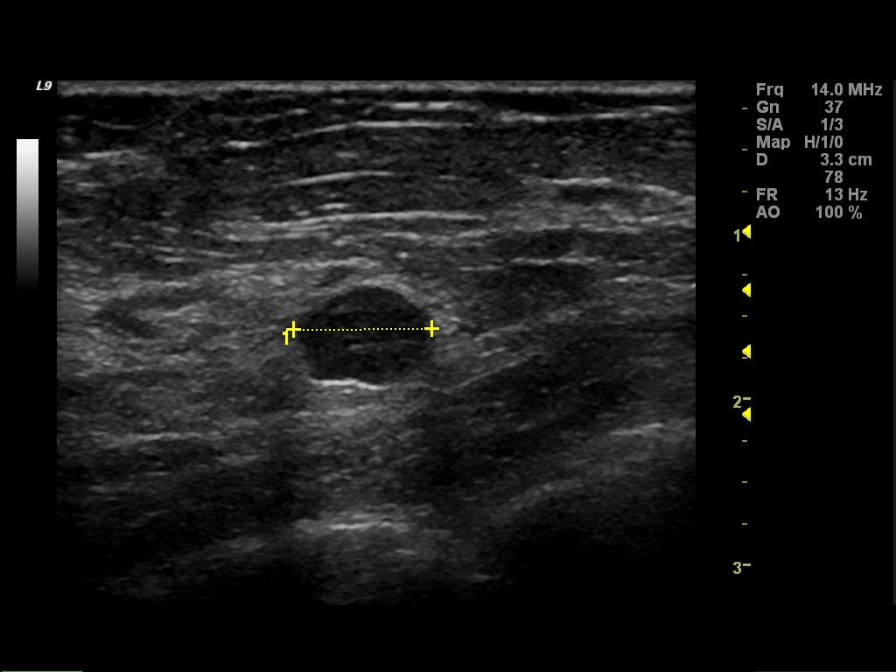
[im 5/8]
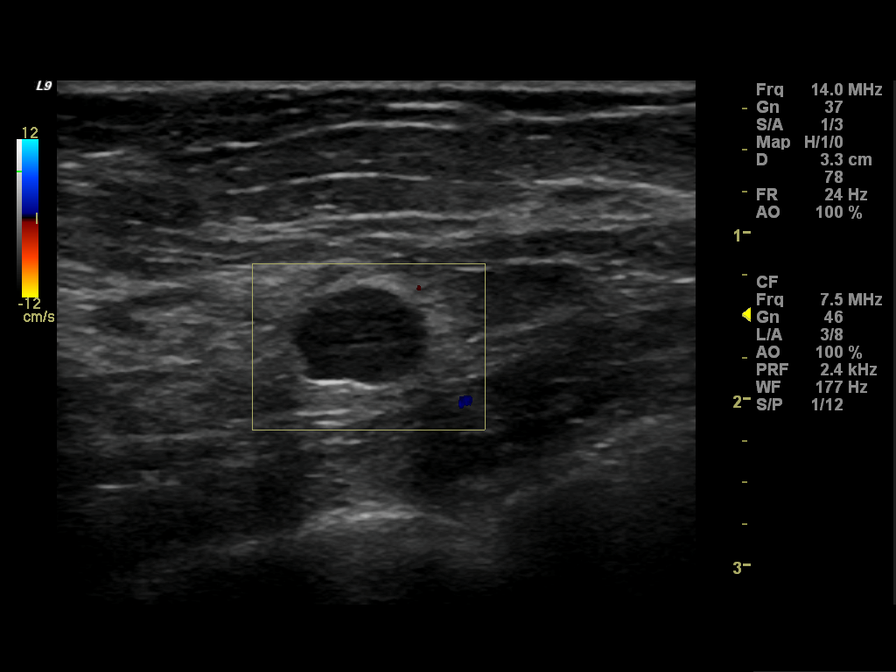
[im 6/8]
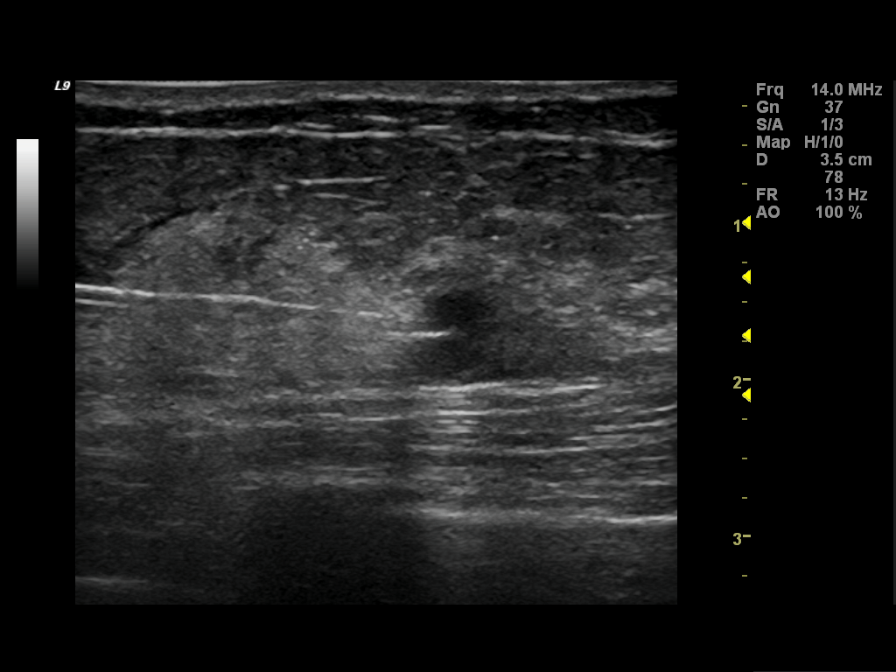
[im 7/8]
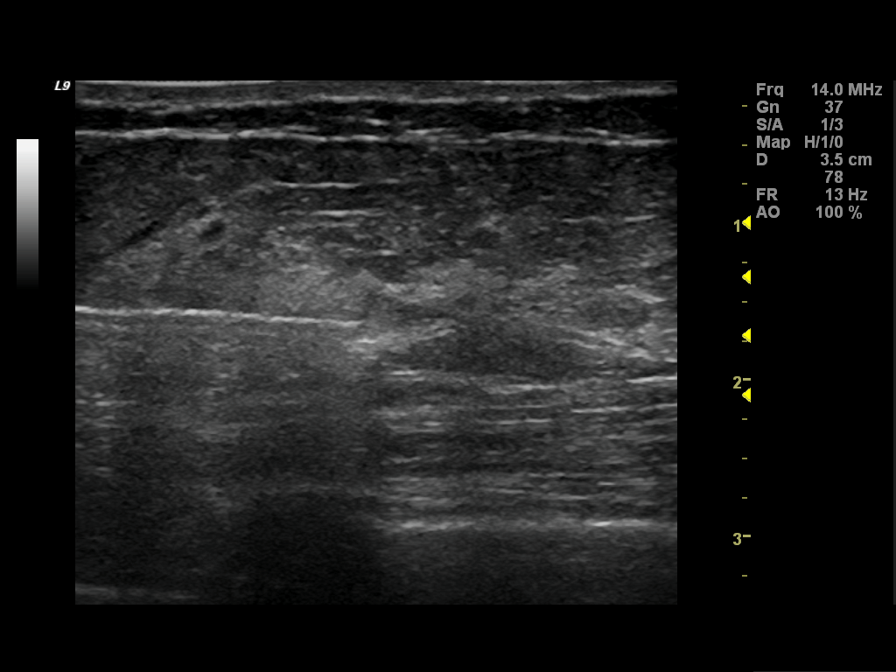
[im 8/8]
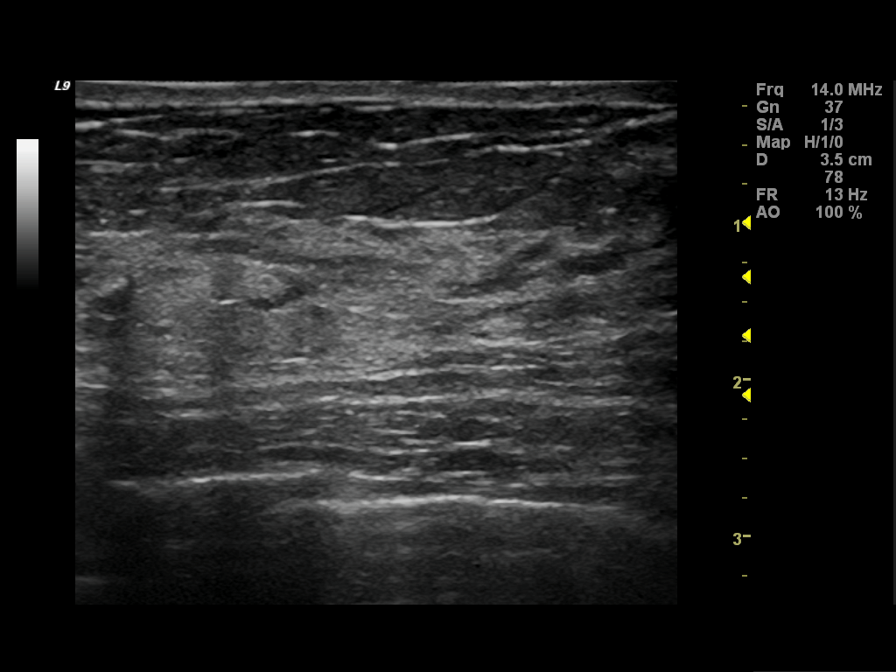

[8 of 8 positions shown; findings below may reference images not displayed]

FINDINGS: On physical exam, I do not palpate a mass in left breast.

Ultrasound is performed, showing a well-circumscribed, hypoechoic
lesion in the left breast at 3 o'clock 1 cm from the nipple
measuring 8 x 6 x 8 mm.
IMPRESSION: Probable benign cyst in the 3 o'clock region of the left breast.

RECOMMENDATION:
Left breast cyst aspiration was performed and will be dictated
separately. The cyst decompressed with aspiration. Bilateral
screening mammogram in 1 year is recommended.

I have discussed the findings and recommendations with the patient.
Results were also provided in writing at the conclusion of the
visit. If applicable, a reminder letter will be sent to the patient
regarding the next appointment.

BI-RADS CATEGORY  2: Benign Finding(s)

## 2016-11-03 MED ORDER — MELOXICAM 15 MG PO TABS
15.0000 mg | ORAL_TABLET | Freq: Every day | ORAL | 0 refills | Status: DC
Start: 2016-11-03 — End: 2020-09-05

## 2016-11-03 MED ORDER — PRAVASTATIN SODIUM 40 MG PO TABS: 40.0000 mg | ORAL_TABLET | Freq: Every evening | ORAL | 1 refills | Status: DC

## 2016-11-03 NOTE — Progress Notes (Signed)
Assessment and Plan:  Hypertension:  -monitor blood pressure at home.  -Continue DASH diet.   -Reminder to go to the ER if any CP, SOB, nausea, dizziness, severe HA, changes vision/speech, left arm numbness and tingling, and jaw pain.  Cholesterol: -cont meds -recheck labs at next visit -Continue diet and exercise.  -Check cholesterol.   Pre-diabetes: -Continue diet and exercise.   Vitamin D Def: -continue medications.   Continue diet and meds as discussed. Further disposition pending results of labs.  HPI 60 y.o. female  presents for 3 month follow up with hypertension, hyperlipidemia, prediabetes and vitamin D.   Her blood pressure has been controlled at home, today their BP is BP: 134/80.   She does workout. She denies chest pain, shortness of breath, dizziness.   She is on cholesterol medication and denies myalgias. Her cholesterol is at goal. The cholesterol last visit was:   Lab Results  Component Value Date   CHOL 204 (H) 04/29/2016   HDL 81 04/29/2016   LDLCALC 99 04/29/2016   TRIG 122 04/29/2016   CHOLHDL 2.5 04/29/2016     She has been working on diet and exercise for prediabetes, and denies foot ulcerations, hyperglycemia, hypoglycemia , increased appetite, nausea, paresthesia of the feet, polydipsia, polyuria, visual disturbances, vomiting and weight loss. Last A1C in the office was:  Lab Results  Component Value Date   HGBA1C 5.7 (H) 04/29/2016    Patient is on Vitamin D supplement.  Lab Results  Component Value Date   VD25OH 40 04/29/2016      Patient reports that she still has some issues from time to time with foot pain.  She is walking a lot, but not riding her bike as much.  She reports that she is still getting exercise.  She typically takes aleve if her foot is really bothersome.    Current Medications:  Current Outpatient Prescriptions on File Prior to Visit  Medication Sig Dispense Refill  . ALPRAZolam (XANAX) 0.5 MG tablet Take 0.5 mg by  mouth 2 (two) times daily.    . calcium citrate-vitamin D 500-400 MG-UNIT chewable tablet Chew by mouth daily.    . cetirizine (ZYRTEC) 10 MG chewable tablet Chew 10 mg by mouth daily.    . Magnesium 400 MG TABS Take 400 mg by mouth daily.    . Multiple Vitamins-Calcium (ONE-A-DAY WOMENS PO) Take by mouth daily.    . Probiotic Product (PROBIOTIC DAILY PO) Take by mouth daily.     No current facility-administered medications on file prior to visit.     Medical History:  Past Medical History:  Diagnosis Date  . Allergy   . Depression    CONTROLLED WITH OCCASIONAL XANAX  . Fibrocystic breast disease   . Hyperlipidemia   . Osteopenia   . Vitamin D deficiency     Allergies:  Allergies  Allergen Reactions  . Acyclovir And Related   . Eggs Or Egg-Derived Products Hives    Patient states she eats eggs with no problem.  Received propofol with no reaction.   . Neosporin [Neomycin-Bacitracin Zn-Polymyx] Hives  . Red Yeast Rice [Cholestin] Other (See Comments)    MYALGIA with higher doses  . Sulfa Antibiotics Hives  . Zostavax [Zoster Vaccine Live] Rash     Review of Systems:  Review of Systems  Constitutional: Negative for chills, fever and malaise/fatigue.  HENT: Negative for congestion, ear pain and sore throat.   Eyes: Negative.   Respiratory: Negative for cough, shortness of breath and  wheezing.   Cardiovascular: Negative for chest pain, palpitations and leg swelling.  Gastrointestinal: Negative for abdominal pain, blood in stool, constipation, diarrhea, heartburn and melena.  Genitourinary: Negative.   Skin: Negative.   Neurological: Negative for dizziness, sensory change, loss of consciousness and headaches.  Psychiatric/Behavioral: Negative for depression. The patient is not nervous/anxious and does not have insomnia.     Family history- Review and unchanged  Social history- Review and unchanged  Physical Exam: BP 134/80   Pulse 64   Temp 98.2 F (36.8 C)  (Temporal)   Resp 16   Ht 5\' 2"  (1.575 m)   Wt 135 lb (61.2 kg)   BMI 24.69 kg/m  Wt Readings from Last 3 Encounters:  11/03/16 135 lb (61.2 kg)  09/23/16 135 lb 9.6 oz (61.5 kg)  08/05/16 134 lb (60.8 kg)    General Appearance: Well nourished well developed, in no apparent distress. Eyes: PERRLA, EOMs, conjunctiva no swelling or erythema ENT/Mouth: Ear canals normal without obstruction, swelling, erythma, discharge.  TMs normal bilaterally.  Oropharynx moist, clear, without exudate, or postoropharyngeal swelling. Neck: Supple, thyroid normal,no cervical adenopathy  Respiratory: Respiratory effort normal, Breath sounds clear A&P without rhonchi, wheeze, or rale.  No retractions, no accessory usage. Cardio: RRR with no MRGs. Brisk peripheral pulses without edema.  Abdomen: Soft, + BS,  Non tender, no guarding, rebound, hernias, masses. Musculoskeletal: Full ROM, 5/5 strength, Normal gait Skin: Warm, dry without rashes, lesions, ecchymosis.  Neuro: Awake and oriented X 3, Cranial nerves intact. Normal muscle tone, no cerebellar symptoms. Psych: Normal affect, Insight and Judgment appropriate.    Starlyn Skeans, PA-C 2:36 PM [facility]

## 2017-02-10 ENCOUNTER — Ambulatory Visit (INDEPENDENT_AMBULATORY_CARE_PROVIDER_SITE_OTHER): Payer: BLUE CROSS/BLUE SHIELD | Admitting: Internal Medicine

## 2017-02-10 ENCOUNTER — Encounter: Payer: Self-pay | Admitting: Internal Medicine

## 2017-02-10 VITALS — BP 128/80 | HR 66 | Temp 98.0°F | Resp 16 | Ht 62.0 in | Wt 136.0 lb

## 2017-02-10 DIAGNOSIS — Z79899 Other long term (current) drug therapy: Secondary | ICD-10-CM | POA: Diagnosis not present

## 2017-02-10 DIAGNOSIS — E782 Mixed hyperlipidemia: Secondary | ICD-10-CM | POA: Diagnosis not present

## 2017-02-10 DIAGNOSIS — E559 Vitamin D deficiency, unspecified: Secondary | ICD-10-CM

## 2017-02-10 DIAGNOSIS — R7303 Prediabetes: Secondary | ICD-10-CM

## 2017-02-10 DIAGNOSIS — F341 Dysthymic disorder: Secondary | ICD-10-CM | POA: Diagnosis not present

## 2017-02-10 MED ORDER — OMEPRAZOLE 40 MG PO CPDR: DELAYED_RELEASE_CAPSULE | ORAL | 1 refills | Status: DC

## 2017-02-10 MED ORDER — EZETIMIBE 10 MG PO TABS: 10.0000 mg | ORAL_TABLET | Freq: Every day | ORAL | 11 refills | Status: DC

## 2017-02-10 NOTE — Progress Notes (Signed)
Assessment and Plan:  Hypertension:  -Continue medication,  -monitor blood pressure at home.  -Continue DASH diet.   -Reminder to go to the ER if any CP, SOB, nausea, dizziness, severe HA, changes vision/speech, left arm numbness and tingling, and jaw pain.  Cholesterol: -stop pravastatin -start zetia 10 mg -Continue diet and exercise.    Pre-diabetes: -Continue diet and exercise.   Vitamin D Def: -continue medications.   Dysthymia -no refills needed today -patieint takes xanax primarily for sleep if needed.     Continue diet and meds as discussed. Further disposition pending results of labs.  HPI 61 y.o. female  presents for 3 month follow up with hypertension, hyperlipidemia, prediabetes and vitamin D.   Her blood pressure has been controlled at home, today their BP is BP: 128/80.   She does workout. She denies chest pain, shortness of breath, dizziness.  She has not been exercising as much as she has not been able to ride her bike due to weather.  She is getting out and walking as much as she can though.     She is on cholesterol medication and denies myalgias. Her cholesterol is at goal. The cholesterol last visit was:   Lab Results  Component Value Date   CHOL 204 (H) 04/29/2016   HDL 81 04/29/2016   LDLCALC 99 04/29/2016   TRIG 122 04/29/2016   CHOLHDL 2.5 04/29/2016     She has been working on diet and exercise for prediabetes, and denies foot ulcerations, hyperglycemia, hypoglycemia , increased appetite, nausea, paresthesia of the feet, polydipsia, polyuria, visual disturbances, vomiting and weight loss. Last A1C in the office was:  Lab Results  Component Value Date   HGBA1C 5.7 (H) 04/29/2016  She is wondering if she can switch to another cholesterol medication as the pravastatin is causing her to have muscle aches and keeps her up at night.    Patient is on Vitamin D supplement.  Lab Results  Component Value Date   VD25OH 40 04/29/2016     She reports that  she has been taking xanax primarily for sleep only if needed.  She is not taking it regularly.  She has not had any issues with anxiety lately.     Current Medications:  Current Outpatient Prescriptions on File Prior to Visit  Medication Sig Dispense Refill  . ALPRAZolam (XANAX) 0.5 MG tablet Take 0.5 mg by mouth 2 (two) times daily.    . calcium citrate-vitamin D 500-400 MG-UNIT chewable tablet Chew by mouth daily.    . cetirizine (ZYRTEC) 10 MG chewable tablet Chew 10 mg by mouth daily.    . Magnesium 400 MG TABS Take 400 mg by mouth daily.    . meloxicam (MOBIC) 15 MG tablet Take 1 tablet (15 mg total) by mouth daily. 90 tablet 0  . Multiple Vitamins-Calcium (ONE-A-DAY WOMENS PO) Take by mouth daily.    . pravastatin (PRAVACHOL) 40 MG tablet Take 1 tablet (40 mg total) by mouth every evening. 90 tablet 1  . Probiotic Product (PROBIOTIC DAILY PO) Take by mouth daily.     No current facility-administered medications on file prior to visit.     Medical History:  Past Medical History:  Diagnosis Date  . Allergy   . Depression    CONTROLLED WITH OCCASIONAL XANAX  . Fibrocystic breast disease   . Hyperlipidemia   . Osteopenia   . Vitamin D deficiency     Allergies:  Allergies  Allergen Reactions  . Acyclovir And Related   .  Eggs Or Egg-Derived Products Hives    Patient states she eats eggs with no problem.  Received propofol with no reaction.   . Neosporin [Neomycin-Bacitracin Zn-Polymyx] Hives  . Red Yeast Rice [Cholestin] Other (See Comments)    MYALGIA with higher doses  . Sulfa Antibiotics Hives  . Zostavax [Zoster Vaccine Live] Rash     Review of Systems:  ROS  Family history- Review and unchanged  Social history- Review and unchanged  Physical Exam: BP 128/80   Pulse 66   Temp 98 F (36.7 C) (Temporal)   Resp 16   Ht 5\' 2"  (1.575 m)   Wt 136 lb (61.7 kg)   BMI 24.87 kg/m  Wt Readings from Last 3 Encounters:  02/10/17 136 lb (61.7 kg)  11/03/16 135 lb  (61.2 kg)  09/23/16 135 lb 9.6 oz (61.5 kg)    General Appearance: Well nourished well developed, in no apparent distress. Eyes: PERRLA, EOMs, conjunctiva no swelling or erythema ENT/Mouth: Ear canals normal without obstruction, swelling, erythma, discharge.  TMs normal bilaterally.  Oropharynx moist, clear, without exudate, or postoropharyngeal swelling. Neck: Supple, thyroid normal,no cervical adenopathy  Respiratory: Respiratory effort normal, Breath sounds clear A&P without rhonchi, wheeze, or rale.  No retractions, no accessory usage. Cardio: RRR with no MRGs. Brisk peripheral pulses without edema.  Abdomen: Soft, + BS,  Non tender, no guarding, rebound, hernias, masses. Musculoskeletal: Full ROM, 5/5 strength, Normal gait Skin: Warm, dry without rashes, lesions, ecchymosis.  Neuro: Awake and oriented X 3, Cranial nerves intact. Normal muscle tone, no cerebellar symptoms. Psych: Normal affect, Insight and Judgment appropriate.    Starlyn Skeans, PA-C 9:17 AM [facility]

## 2017-04-08 ENCOUNTER — Ambulatory Visit (INDEPENDENT_AMBULATORY_CARE_PROVIDER_SITE_OTHER): Payer: BLUE CROSS/BLUE SHIELD | Admitting: Physician Assistant

## 2017-04-08 ENCOUNTER — Encounter: Payer: Self-pay | Admitting: Physician Assistant

## 2017-04-08 ENCOUNTER — Ambulatory Visit (HOSPITAL_COMMUNITY)
Admission: RE | Admit: 2017-04-08 | Discharge: 2017-04-08 | Disposition: A | Discharge status: Home / Self Care | Payer: BLUE CROSS/BLUE SHIELD | Source: Ambulatory Visit | Attending: Physician Assistant | Admitting: Physician Assistant

## 2017-04-08 VITALS — BP 130/74 | HR 76 | Temp 97.5°F | Resp 16 | Ht 62.0 in | Wt 137.8 lb

## 2017-04-08 DIAGNOSIS — S92321D Displaced fracture of second metatarsal bone, right foot, subsequent encounter for fracture with routine healing: Secondary | ICD-10-CM | POA: Diagnosis not present

## 2017-04-08 DIAGNOSIS — M79671 Pain in right foot: Secondary | ICD-10-CM | POA: Diagnosis not present

## 2017-04-08 DIAGNOSIS — M2142 Flat foot [pes planus] (acquired), left foot: Secondary | ICD-10-CM

## 2017-04-08 DIAGNOSIS — X58XXXA Exposure to other specified factors, initial encounter: Secondary | ICD-10-CM | POA: Insufficient documentation

## 2017-04-08 DIAGNOSIS — S92321A Displaced fracture of second metatarsal bone, right foot, initial encounter for closed fracture: Secondary | ICD-10-CM | POA: Insufficient documentation

## 2017-04-08 DIAGNOSIS — M2141 Flat foot [pes planus] (acquired), right foot: Secondary | ICD-10-CM | POA: Diagnosis not present

## 2017-04-08 LAB — CBC WITH DIFFERENTIAL/PLATELET
Basophils Absolute: 67 cells/uL (ref 0–200)
Basophils Relative: 1 %
Eosinophils Absolute: 134 cells/uL (ref 15–500)
Eosinophils Relative: 2 %
HCT: 42.2 % (ref 35.0–45.0)
Hemoglobin: 14.1 g/dL (ref 11.7–15.5)
Lymphocytes Relative: 43 %
Lymphs Abs: 2881 cells/uL (ref 850–3900)
MCH: 32.9 pg (ref 27.0–33.0)
MCHC: 33.4 g/dL (ref 32.0–36.0)
MCV: 98.4 fL (ref 80.0–100.0)
MPV: 10.5 fL (ref 7.5–12.5)
Monocytes Absolute: 402 cells/uL (ref 200–950)
Monocytes Relative: 6 %
Neutro Abs: 3216 cells/uL (ref 1500–7800)
Neutrophils Relative %: 48 %
Platelets: 284 10*3/uL (ref 140–400)
RBC: 4.29 MIL/uL (ref 3.80–5.10)
RDW: 14 % (ref 11.0–15.0)
WBC: 6.7 10*3/uL (ref 3.8–10.8)

## 2017-04-08 NOTE — Progress Notes (Signed)
Pt aware of lab results & voiced understanding of those results.

## 2017-04-08 NOTE — Patient Instructions (Addendum)
Look up vionic sandals   Flat Feet Flat feet, also called fallen arches, are feet that do not have a curve (arch) on the inner side of the foot. Normally, an arch develops during childhood and creates a gap between the foot and the ground. Flat feet rest entirely on the ground, without a gap. In some cases, an arch never develops. In other cases, an arch develops and later collapses.  This is a common condition that can affect one foot or both feet. CAUSES This condition may be caused by:  Failure of normal arch development during childhood.  Injury to connective tissues (tendons and ligaments) in the foot, such as the tendon that supports the arch (posterior tibial tendon).  Loose tendons or ligaments in the foot.  A wearing down of the arch over time.  Injury to bones in the foot.  Abnormality in the bones of the foot (tarsal coalition). This is when two or more bones in the foot are joined together (fused) during development before birth. This limits foot movement and can cause flat feet. RISK FACTORS This condition is more common in females. It is also more likely to develop in people who:  Are 40 years or older.  Have a family history of flat feet.  Have a history of childhood flexible flatfoot. This is a common childhood condition in which the arch disappears when a child is standing but is present when a child is sitting or standing on tiptoes.  Are obese.  Have diabetes.  Have high blood pressure.  Participate in high-impact sports that can damage the posterior tibial tendon.  Have inflammatory arthritis.  Have a history of broken (fractured) or dislocated bones in the foot. SYMPTOMS Symptoms of this condition may include:  Pain or tightness along the bottom of the foot.  Foot pain that gets worse with activity.  Swelling of the inner foot.  Swelling of the ankle.  Pain on the outside of the ankle.  Changes in the way you walk (gait).  Pronation. This is  when the foot and ankle lean inward when standing.  Bony bumps on the top or inside of the foot. DIAGNOSIS This condition is diagnosed with a physical exam of your foot and ankle. Your health care provider may look at your shoes for patterns of wear on the soles. You may also have tests, including X-rays, CT scan, or MRI. You may be referred to a health care provider who specializes in feet (podiatrist) or a physical therapist. TREATMENT Treatment may include one or more of the following:  Stretching exercises or physical therapy to lengthen the heel tendon (Achilles tendon), increase range of motion, and relieve pain.  Shoe inserts (orthotics) to support the arches of your feet. These can be purchased from a store or they can be custom-made by your health care provider.  Wearing shoes with appropriate arch support. This is especially important for athletes. Your health care provider may recommend shoes for you to wear.  Medicines to relieve pain.  An ankle brace, boot, or cast to relieve pressure on your foot. You may be given crutches if walking is painful.  Surgery to improve alignment of your foot. This is only needed if your posterior tibial tendon is torn, or if you have tarsal coalition. HOME CARE INSTRUCTIONS  Take over-the-counter and prescription medicines only as told by your health care provider.  Wear orthotics and appropriate shoes as told by your health care provider.  Rest your feet and avoid or  change activities that cause pain.  Perform exercises to strengthen and stretch your feet as told by your health care provider or physical therapist.  If you have nerve damage from diabetes, check your feet daily for swelling, sores, blisters, or calluses. PREVENTION Flat feet cannot always be prevented. However, you can take these actions to help reduce your chance of developing flat feet or to help prevent your current condition from getting worse:  Wear comfortable,  supportive shoes that are appropriate for your activities.  Maintain a healthy weight.  Stay active, as told by your health care provider. This will help to keep your feet flexible and strong.  Manage long-term (chronic) health conditions, such as diabetes, high blood pressure, and inflammatory arthritis, if this applies.  Work with a health care provider if you have concerns about your feet or your shoes. SEEK MEDICAL CARE IF:  You have pain in your foot or lower leg that gets worse or does not improve with medicine.  You have pain or difficulty when walking.  You have problems with your orthotics or your shoes. This information is not intended to replace advice given to you by your health care provider. Make sure you discuss any questions you have with your health care provider. Document Released: 09/21/2009 Document Revised: 03/17/2016 Document Reviewed: 05/23/2015 Elsevier Interactive Patient Education  2017 [employer].

## 2017-04-08 NOTE — Progress Notes (Signed)
   Subjective:    Patient ID: Norma Cameron, female    DOB: 1956-05-15, 61 y.o.   MRN: [id number]  HPI 61 y.o. WF presents with right foot pain x last August, does not recall an injury. Swelling top of her foot, has taken tylenol/aleve. No numbness tingling in that foot, nothing makes it better or worse.   Medications Current Outpatient Prescriptions on File Prior to Visit  Medication Sig  . ALPRAZolam (XANAX) 0.5 MG tablet Take 0.5 mg by mouth 2 (two) times daily.  . calcium citrate-vitamin D 500-400 MG-UNIT chewable tablet Chew by mouth daily.  . cetirizine (ZYRTEC) 10 MG chewable tablet Chew 10 mg by mouth daily.  Marland Kitchen ezetimibe (ZETIA) 10 MG tablet Take 1 tablet (10 mg total) by mouth daily.  . Magnesium 400 MG TABS Take 400 mg by mouth daily.  . meloxicam (MOBIC) 15 MG tablet Take 1 tablet (15 mg total) by mouth daily.  . Multiple Vitamins-Calcium (ONE-A-DAY WOMENS PO) Take by mouth daily.  . Probiotic Product (PROBIOTIC DAILY PO) Take by mouth daily.  Marland Kitchen omeprazole (PRILOSEC) 40 MG capsule Take 1 tablet PO in morning 30 minutes prior to eating on an empty stomach.   No current facility-administered medications on file prior to visit.     Problem list She has Hyperlipidemia; Vitamin D deficiency; Allergy; Depression; Medication management; Prediabetes; and Climacteric on her problem list.   Review of Systems  Constitutional: Negative for chills and fatigue.  Musculoskeletal: Positive for arthralgias and joint swelling. Negative for myalgias, neck pain and neck stiffness.  Neurological: Negative for dizziness, weakness and numbness.       Objective:   Physical Exam  Constitutional: She is oriented to person, place, and time. She appears well-developed and well-nourished. No distress.  HENT:  Head: Normocephalic.  Mouth/Throat: Oropharynx is clear and moist. No oropharyngeal exudate.  Eyes: Conjunctivae are normal. No scleral icterus.  Neck: Normal range of motion. Neck supple.  No JVD present. No thyromegaly present.  Musculoskeletal: Normal range of motion.       Right ankle: She exhibits normal range of motion, no swelling, no ecchymosis, no deformity, no laceration and normal pulse. No lateral malleolus, no medial malleolus, no AITFL, no CF ligament, no posterior TFL, no head of 5th metatarsal and no proximal fibula tenderness found.       Feet:  Lymphadenopathy:    She has no cervical adenopathy.  Neurological: She is alert and oriented to person, place, and time.  Skin: Skin is warm and dry. She is not diaphoretic.  Psychiatric: She has a normal mood and affect. Her behavior is normal. Judgment and thought content normal.  Nursing note and vitals reviewed.      Assessment & Plan:   Right foot pain with pes planus Wear supportive shoes/arch support - Ambulatory referral to Orthopedics - DG Foot Complete Right; Future - CBC with Differential/Platelet - Uric acid

## 2017-04-09 LAB — URIC ACID: Uric Acid, Serum: 4.4 mg/dL (ref 2.5–7.0)

## 2017-04-09 NOTE — Progress Notes (Signed)
LVM for pt to return office call for LAB results.

## 2017-04-13 NOTE — Progress Notes (Signed)
Pt aware of lab results & voiced understanding of those results.

## 2017-04-14 ENCOUNTER — Ambulatory Visit (INDEPENDENT_AMBULATORY_CARE_PROVIDER_SITE_OTHER): Payer: BLUE CROSS/BLUE SHIELD | Admitting: Orthopedic Surgery

## 2017-04-14 ENCOUNTER — Encounter (INDEPENDENT_AMBULATORY_CARE_PROVIDER_SITE_OTHER): Payer: Self-pay | Admitting: Orthopedic Surgery

## 2017-04-14 VITALS — Ht 62.0 in | Wt 137.0 lb

## 2017-04-14 DIAGNOSIS — M84374A Stress fracture, right foot, initial encounter for fracture: Secondary | ICD-10-CM

## 2017-04-14 DIAGNOSIS — M84377A Stress fracture, right toe(s), initial encounter for fracture: Secondary | ICD-10-CM

## 2017-04-14 DIAGNOSIS — M84376A Stress fracture, unspecified foot, initial encounter for fracture: Secondary | ICD-10-CM | POA: Insufficient documentation

## 2017-04-14 NOTE — Progress Notes (Signed)
Office Visit Note   Patient: Norma Cameron           Date of Birth: [birth year]           MRN: [id number] Visit Date: 04/14/2017              Requested by: Vicie Mutters, PA-C [address] [place], [place] [address] PCP: Unk Pinto, MD  Chief Complaint  Patient presents with  . Right Foot - Pain      HPI: Patient is a 61 year old woman who has been having second metatarsal foot pain for the past 9 months she denies any specific injury she complains of swelling over the midfoot dorsally she has used ice anti-inflammatories without relief she states she limps at times she states the pain is worse after she's been on the foot for prolonged periods of time and states that the pain is worse if she's been on her foot barefoot all day. Radiographs were obtained 04/08/17.  Assessment & Plan: Visit Diagnoses:  1. Stress fracture of metatarsal due to multiple or repetitive stress, right, initial encounter     Plan: Patient was given instructions for heel cord stretching she will do this daily and this was demonstrated to her. Recommended a stiff soled walking shoe with an over-the-counter orthotic to unload pressure from the metatarsal heads. Follow-up as needed. If patient is still symptomatic we could consider a Weil osteotomy of the second metatarsal.  Follow-Up Instructions: Return if symptoms worsen or fail to improve.   Ortho Exam  Patient is alert, oriented, no adenopathy, well-dressed, normal affect, normal respiratory effort. Examination patient has a good dorsalis pedis and posterior tibial pulses she has good dorsiflexion of the ankle. She is tender to palpation along the base of the second metatarsal. She has good ankle good subtalar motion. Radiographs are reviewed which shows a healing stress fracture base of the second metatarsal right foot with a long second metatarsal. There is some periarticular cystic changes around the MTP joints. Patient's uric acid  that was recently checked was 4.4 and in the normal range. Hemoglobin A1c is slightly  IMAging: No results found.  Labs: Lab Results  Component Value Date   HGBA1C 5.7 (H) 04/29/2016   HGBA1C 5.6 11/07/2015   HGBA1C 5.8 (H) 04/30/2015   LABURIC 4.4 04/08/2017   LABORGA NO GROWTH 07/05/2015   LABORGA No Salmonella,Shigella,Campylobacter,Yersinia,or 07/05/2015   LABORGA No E.coli 0157:H7 isolated. 07/05/2015    Orders:  No orders of the defined types were placed in this encounter.  No orders of the defined types were placed in this encounter.    Procedures: No procedures performed  Clinical Data: No additional findings.  ROS:  All other systems negative, except as noted in the HPI. Review of Systems  Objective: Vital Signs: Ht 5\' 2"  (1.575 m)   Wt 137 lb (62.1 kg)   BMI 25.06 kg/m   Specialty Comments:  No specialty comments available.  PMFS History: Patient Active Problem List   Diagnosis Date Noted  . Stress fracture of metatarsal due to multiple or repetitive stress 04/14/2017  . Medication management 09/05/2014  . Prediabetes 09/05/2014  . Climacteric 09/05/2014  . Hyperlipidemia   . Vitamin D deficiency   . Allergy   . Depression    Past Medical History:  Diagnosis Date  . Allergy   . Depression    CONTROLLED WITH OCCASIONAL XANAX  . Fibrocystic breast disease   . Hyperlipidemia   . Osteopenia   .  Vitamin D deficiency     Family History  Problem Relation Age of Onset  . Hypertension Mother   . Hyperlipidemia Mother   . Heart disease Father     CABG  . Hyperlipidemia Father   . Hypertension Father   . Cancer Paternal Uncle     THROAT  . Heart disease Paternal Grandfather   . Diabetes Paternal Grandfather     Past Surgical History:  Procedure Laterality Date  . CARPAL TUNNEL RELEASE Right 12/06/2015   Procedure: RIGHT CARPAL TUNNEL RELEASE;  Surgeon: Leanora Cover, MD;  Location: [facility];  Service: Orthopedics;   Laterality: Right;  . CERVIX LESION DESTRUCTION     EARLY 20'S  . DILATION AND CURETTAGE OF UTERUS  2006  . [place]  2006  . SKIN CANCER EXCISION Right 10/2011   LOWER EXT BCC   Social History   Occupational History  . Not on file.   Social History Main Topics  . Smoking status: Former Smoker    Quit date: 03/13/1990  . Smokeless tobacco: Never Used  . Alcohol use No  . Drug use: No  . Sexual activity: Not on file

## 2017-04-30 ENCOUNTER — Encounter: Payer: Self-pay | Admitting: Internal Medicine

## 2017-07-16 DIAGNOSIS — Z01419 Encounter for gynecological examination (general) (routine) without abnormal findings: Secondary | ICD-10-CM | POA: Diagnosis not present

## 2017-07-27 NOTE — Progress Notes (Signed)
Complete Physical  Assessment and Plan:  Mixed hyperlipidemia -continue medications, check lipids, decrease fatty foods, increase activity.  -     CBC with Differential/Platelet -     BASIC METABOLIC PANEL WITH GFR -     Hepatic function panel -     TSH -     Lipid panel  Prediabetes Discussed general issues about diabetes pathophysiology and management., Educational material distributed., Suggested low cholesterol diet., Encouraged aerobic exercise., Discussed foot care., Reminded to get yearly retinal exam. -     Hemoglobin A1c  Recurrent major depressive disorder, in partial remission ([place])  stress management techniques discussed, increase water, good sleep hygiene discussed, increase exercise, and increase veggies.   Vitamin D deficiency -     VITAMIN D 25 Hydroxy (Vit-D Deficiency, Fractures)  Medication management -     Magnesium  Allergic state, initial encounter  Climacteric  Encounter for general adult medical examination with abnormal findings  Screening for hematuria or proteinuria -     Urinalysis, Routine w reflex microscopic -     Microalbumin / creatinine urine ratio  Screening for cardiovascular condition -     Cancel: EKG 12-Lead  Discussed med's effects and SE's. Screening labs and tests as requested with regular follow-up as recommended.  HPI  61 y.o. female  presents for a complete physical.  Her blood pressure has been controlled at home, today their BP is BP: 120/72.  She does workout. She denies chest pain, shortness of breath, dizziness.  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   She is on cholesterol medication and denies myalgias. Her cholesterol is at goal. The cholesterol last visit was:  Lab Results  Component Value Date   CHOL 204 (H) 04/29/2016   HDL 81 04/29/2016   LDLCALC 99 04/29/2016   TRIG 122 04/29/2016   CHOLHDL 2.5 04/29/2016  . She has been working on diet and  exercise for prediabetes, she is on bASA, she is not on ACE/ARB and denies foot ulcerations, hyperglycemia, hypoglycemia , increased appetite, nausea, paresthesia of the feet, polydipsia, polyuria, visual disturbances, vomiting and weight loss. Last A1C in the office was:  Lab Results  Component Value Date   HGBA1C 5.7 (H) 04/29/2016   Patient is on Vitamin D supplement.   Lab Results  Component Value Date   VD25OH 40 04/29/2016     BMI is Body mass index is 25.56 kg/m., she is working on diet and exercise. Wt Readings from Last 3 Encounters:  07/29/17 136 lb 9.6 oz (62 kg)  04/14/17 137 lb (62.1 kg)  04/08/17 137 lb 12.8 oz (62.5 kg)     Current Medications:  Current Outpatient Prescriptions on File Prior to Visit  Medication Sig Dispense Refill  . ALPRAZolam (XANAX) 0.5 MG tablet Take 0.5 mg by mouth 2 (two) times daily.    . calcium citrate-vitamin D 500-400 MG-UNIT chewable tablet Chew by mouth daily.    . cetirizine (ZYRTEC) 10 MG chewable tablet Chew 10 mg by mouth daily.    . Magnesium 400 MG TABS Take 400 mg by mouth daily.    . meloxicam (MOBIC) 15 MG tablet Take 1 tablet (15 mg total) by mouth daily. 90 tablet 0  . Multiple Vitamins-Calcium (ONE-A-DAY WOMENS PO) Take by mouth daily.    . Probiotic Product (PROBIOTIC DAILY PO) Take by mouth daily.    Marland Kitchen omeprazole (PRILOSEC) 40 MG capsule Take 1 tablet PO in  morning 30 minutes prior to eating on an empty stomach. 14 capsule 1   No current facility-administered medications on file prior to visit.     Health Maintenance:   Immunization History  Administered Date(s) Administered  . Tdap 02/04/2008  . Zoster 12/28/2012   Tetanus: 2009 Zostavax: 2014 Pap:  07/2017, never abnormal, Dr. Nori Riis MGM: 2018 Dr. Nori Riis DEXA: 2017 Dr. Nori Riis Colonoscopy: 2009 due next year Dr. Earlean Shawl Last Dental Exam: Dr. Berdine Addison Last Eye Exam: Dr. Nicki Reaper, 2018  Patient Care Team: Unk Pinto, MD as PCP - General (Internal Medicine) Macarthur Critchley, [place] as Referring Physician (Optometry) Richmond Campbell, MD as Consulting Physician (Gastroenterology) Maisie Fus, MD as Consulting Physician (Obstetrics and Gynecology) Fayette Pho, MD as Referring Physician (Orthopedic Surgery) Lavonna Monarch, MD as Consulting Physician (Dermatology)  Medical History:  Past Medical History:  Diagnosis Date  . Allergy   . Depression    CONTROLLED WITH OCCASIONAL XANAX  . Fibrocystic breast disease   . Hyperlipidemia   . Osteopenia   . Vitamin D deficiency    Allergies Allergies  Allergen Reactions  . Acyclovir And Related   . Eggs Or Egg-Derived Products Hives    Patient states she eats eggs with no problem.  Received propofol with no reaction.   . Neosporin [Neomycin-Bacitracin Zn-Polymyx] Hives  . Red Yeast Rice [Cholestin] Other (See Comments)    MYALGIA with higher doses  . Sulfa Antibiotics Hives  . Zostavax [Zoster Vaccine Live] Rash    SURGICAL HISTORY She  has a past surgical history that includes Dilation and curettage of uterus (2006); Novasure ablation (2006); Cervix lesion destruction; Skin cancer excision (Right, 10/2011); and Carpal tunnel release (Right, 12/06/2015). FAMILY HISTORY Her family history includes Cancer in her paternal uncle; Diabetes in her paternal grandfather; Heart disease in her father and paternal grandfather; Hyperlipidemia in her father and mother; Hypertension in her father and mother. SOCIAL HISTORY She  reports that she quit smoking about 27 years ago. She has never used smokeless tobacco. She reports that she does not drink alcohol or use drugs.'  Review of Systems: Review of Systems  Constitutional: Negative for chills, fever and malaise/fatigue.  HENT: Negative for congestion, ear pain and sore throat.   Respiratory: Negative for cough, shortness of breath and wheezing.   Cardiovascular: Negative for chest pain, palpitations and leg swelling.  Gastrointestinal: Negative for abdominal  pain, blood in stool, constipation, diarrhea, heartburn and melena.  Genitourinary: Negative.   Skin: Negative.   Neurological: Negative for dizziness, sensory change, loss of consciousness and headaches.  Psychiatric/Behavioral: Negative for depression. The patient is not nervous/anxious and does not have insomnia.     Physical Exam: Estimated body mass index is 25.56 kg/m as calculated from the following:   Height as of this encounter: 5' 1.3" (1.557 m).   Weight as of this encounter: 136 lb 9.6 oz (62 kg). BP 120/72   Pulse 78   Temp (!) 97.5 F (36.4 C)   Resp 14   Ht 5' 1.3" (1.557 m)   Wt 136 lb 9.6 oz (62 kg)   SpO2 97%   BMI 25.56 kg/m   General Appearance: Well nourished well developed, in no apparent distress.  Eyes: PERRLA, EOMs, conjunctiva no swelling or erythema ENT/Mouth: Ear canals normal without obstruction, swelling, erythema, or discharge.  TMs normal bilaterally with no erythema, bulging, retraction, or loss of landmark.  Oropharynx moist and clear with no exudate, erythema, or swelling.   Neck: Supple, thyroid normal.  No bruits.  No cervical adenopathy Respiratory: Respiratory effort normal, Breath sounds clear A&P without wheeze, rhonchi, rales.   Cardio: RRR without murmurs, rubs or gallops. Brisk peripheral pulses without edema.  Chest: symmetric, with normal excursions Breasts: Symmetric, without lumps, nipple discharge, retractions.  Abdomen: Soft, nontender, no guarding, rebound, hernias, masses, or organomegaly.  Lymphatics: Non tender without lymphadenopathy.  Musculoskeletal: Full ROM all peripheral extremities,5/5 strength, and normal gait.  Skin: Warm, dry without rashes, lesions, ecchymosis. Neuro: Awake and oriented X 3, Cranial nerves intact, reflexes equal bilaterally. Normal muscle tone, no cerebellar symptoms. Sensation intact.  Psych:  normal affect, Insight and Judgment appropriate.   EKG: defer  AORTA SCAN: defer  Over 40 minutes of  exam, counseling, chart review and critical decision making was performed  Vicie Mutters 3:25 PM [facility]

## 2017-07-29 ENCOUNTER — Ambulatory Visit (INDEPENDENT_AMBULATORY_CARE_PROVIDER_SITE_OTHER): Payer: BLUE CROSS/BLUE SHIELD | Admitting: Physician Assistant

## 2017-07-29 ENCOUNTER — Encounter: Payer: Self-pay | Admitting: Physician Assistant

## 2017-07-29 VITALS — BP 120/72 | HR 78 | Temp 97.5°F | Resp 14 | Ht 61.3 in | Wt 136.6 lb

## 2017-07-29 DIAGNOSIS — Z79899 Other long term (current) drug therapy: Secondary | ICD-10-CM

## 2017-07-29 DIAGNOSIS — F3341 Major depressive disorder, recurrent, in partial remission: Secondary | ICD-10-CM

## 2017-07-29 DIAGNOSIS — Z1389 Encounter for screening for other disorder: Secondary | ICD-10-CM

## 2017-07-29 DIAGNOSIS — E559 Vitamin D deficiency, unspecified: Secondary | ICD-10-CM

## 2017-07-29 DIAGNOSIS — T7840XA Allergy, unspecified, initial encounter: Secondary | ICD-10-CM

## 2017-07-29 DIAGNOSIS — Z0001 Encounter for general adult medical examination with abnormal findings: Secondary | ICD-10-CM | POA: Diagnosis not present

## 2017-07-29 DIAGNOSIS — Z136 Encounter for screening for cardiovascular disorders: Secondary | ICD-10-CM

## 2017-07-29 DIAGNOSIS — E782 Mixed hyperlipidemia: Secondary | ICD-10-CM

## 2017-07-29 DIAGNOSIS — N951 Menopausal and female climacteric states: Secondary | ICD-10-CM

## 2017-07-29 DIAGNOSIS — R7303 Prediabetes: Secondary | ICD-10-CM

## 2017-07-29 NOTE — Patient Instructions (Addendum)
Your ears and sinuses are connected by the eustachian tube. When your sinuses are inflamed, this can close off the tube and cause fluid to collect in your middle ear. This can then cause dizziness, popping, clicking, ringing, and echoing in your ears. This is often NOT an infection and does NOT require antibiotics, it is caused by inflammation so the treatments help the inflammation. This can take a long time to get better so please be patient.  Here are things you can do to help with this: - Try the Flonase or Nasonex. Remember to spray each nostril twice towards the outer part of your eye.  Do not sniff but instead pinch your nose and tilt your head back to help the medicine get into your sinuses.  The best time to do this is at bedtime.Stop if you get blurred vision or nose bleeds.  -While drinking fluids, pinch and hold nose close and swallow, to help open eustachian tubes to drain fluid behind ear drums. -Please pick one of the over the counter allergy medications below and take it once daily for allergies.  It will also help with fluid behind ear drums. Claritin or loratadine cheapest but likely the weakest  Zyrtec or certizine at night because it can make you sleepy The strongest is allegra or fexafinadine  Cheapest at [employer], [employer], [employer] -can use decongestant over the counter, please do not use if you have high blood pressure or certain heart conditions.   if worsening HA, changes vision/speech, imbalance, weakness go to the ER  Take omeprazole over the counter for 2 weeks, then go to zantac 150-300 mg at night for 2 weeks, then you can stop.  Avoid alcohol, spicy foods, NSAIDS (aleve, ibuprofen) at this time. See foods below.   Food Choices for Gastroesophageal Reflux Disease When you have gastroesophageal reflux disease (GERD), the foods you eat and your eating habits are very important. Choosing the right foods can help ease the discomfort of GERD. WHAT GENERAL GUIDELINES DO I NEED TO  FOLLOW?  Choose fruits, vegetables, whole grains, low-fat dairy products, and low-fat meat, fish, and poultry.  Limit fats such as oils, salad dressings, butter, nuts, and avocado.  Keep a food diary to identify foods that cause symptoms.  Avoid foods that cause reflux. These may be different for different people.  Eat frequent small meals instead of three large meals each day.  Eat your meals slowly, in a relaxed setting.  Limit fried foods.  Cook foods using methods other than frying.  Avoid drinking alcohol.  Avoid drinking large amounts of liquids with your meals.  Avoid bending over or lying down until 2-3 hours after eating. WHAT FOODS ARE NOT RECOMMENDED? The following are some foods and drinks that may worsen your symptoms: Vegetables Tomatoes. Tomato juice. Tomato and spaghetti sauce. Chili peppers. Onion and garlic. Horseradish. Fruits Oranges, grapefruit, and lemon (fruit and juice). Meats High-fat meats, fish, and poultry. This includes hot dogs, ribs, ham, sausage, salami, and bacon. Dairy Whole milk and chocolate milk. Sour cream. Cream. Butter. Ice cream. Cream cheese.  Beverages Coffee and tea, with or without caffeine. Carbonated beverages or energy drinks. Condiments Hot sauce. Barbecue sauce.  Sweets/Desserts Chocolate and cocoa. Donuts. Peppermint and spearmint. Fats and Oils High-fat foods, including [place] fries and potato chips. Other Vinegar. Strong spices, such as black pepper, white pepper, red pepper, cayenne, curry powder, cloves, ginger, and chili powder.

## 2017-07-30 LAB — VITAMIN D 25 HYDROXY (VIT D DEFICIENCY, FRACTURES): Vit D, 25-Hydroxy: 37 ng/mL (ref 30–100)

## 2017-07-30 LAB — LIPID PANEL
Cholesterol: 252 mg/dL — ABNORMAL HIGH (ref <200)
HDL: 76 mg/dL (ref >50)
LDL Cholesterol (Calc): 149 mg/dL (calc) — ABNORMAL HIGH
Non-HDL Cholesterol (Calc): 176 mg/dL (calc) — ABNORMAL HIGH (ref <130)
Total CHOL/HDL Ratio: 3.3 (calc) (ref <5.0)
Triglycerides: 145 mg/dL (ref <150)

## 2017-07-30 LAB — CBC WITH DIFFERENTIAL/PLATELET
Basophils Absolute: 60 cells/uL (ref 0–200)
Basophils Relative: 0.7 %
Eosinophils Absolute: 95 cells/uL (ref 15–500)
Eosinophils Relative: 1.1 %
HCT: 43 % (ref 35.0–45.0)
Hemoglobin: 14.6 g/dL (ref 11.7–15.5)
Lymphs Abs: 3070 cells/uL (ref 850–3900)
MCH: 32.6 pg (ref 27.0–33.0)
MCHC: 34 g/dL (ref 32.0–36.0)
MCV: 96 fL (ref 80.0–100.0)
MPV: 11.2 fL (ref 7.5–12.5)
Monocytes Relative: 6 %
Neutro Abs: 4859 cells/uL (ref 1500–7800)
Neutrophils Relative %: 56.5 %
Platelets: 293 10*3/uL (ref 140–400)
RBC: 4.48 10*6/uL (ref 3.80–5.10)
RDW: 12.8 % (ref 11.0–15.0)
Total Lymphocyte: 35.7 %
WBC mixed population: 516 cells/uL (ref 200–950)
WBC: 8.6 10*3/uL (ref 3.8–10.8)

## 2017-07-30 LAB — URINALYSIS, ROUTINE W REFLEX MICROSCOPIC
Bilirubin Urine: NEGATIVE
Glucose, UA: NEGATIVE
Hgb urine dipstick: NEGATIVE
Ketones, ur: NEGATIVE
Leukocytes, UA: NEGATIVE
Nitrite: NEGATIVE
Protein, ur: NEGATIVE
Specific Gravity, Urine: 1.013 (ref 1.001–1.03)
pH: 5.5 (ref 5.0–8.0)

## 2017-07-30 LAB — BASIC METABOLIC PANEL WITH GFR
BUN: 15 mg/dL (ref 7–25)
CO2: 29 mmol/L (ref 20–32)
Calcium: 9.9 mg/dL (ref 8.6–10.4)
Chloride: 101 mmol/L (ref 98–110)
Creat: 0.82 mg/dL (ref 0.50–0.99)
GFR, Est African American: 90 mL/min/{1.73_m2} (ref >=60)
GFR, Est Non African American: 78 mL/min/{1.73_m2} (ref >=60)
Glucose, Bld: 94 mg/dL (ref 65–99)
Potassium: 4.3 mmol/L (ref 3.5–5.3)
Sodium: 139 mmol/L (ref 135–146)

## 2017-07-30 LAB — MICROALBUMIN / CREATININE URINE RATIO
Creatinine, Urine: 80 mg/dL (ref 20–320)
Microalb Creat Ratio: 3 mcg/mg creat (ref <30)
Microalb, Ur: 0.2 mg/dL

## 2017-07-30 LAB — HEPATIC FUNCTION PANEL
AG Ratio: 1.8 (calc) (ref 1.0–2.5)
ALT: 11 U/L (ref 6–29)
AST: 17 U/L (ref 10–35)
Albumin: 4.8 g/dL (ref 3.6–5.1)
Alkaline phosphatase (APISO): 55 U/L (ref 33–130)
Bilirubin, Direct: 0.1 mg/dL (ref 0.0–0.2)
Globulin: 2.6 g/dL (calc) (ref 1.9–3.7)
Indirect Bilirubin: 0.5 mg/dL (calc) (ref 0.2–1.2)
Total Bilirubin: 0.6 mg/dL (ref 0.2–1.2)
Total Protein: 7.4 g/dL (ref 6.1–8.1)

## 2017-07-30 LAB — HEMOGLOBIN A1C
Hgb A1c MFr Bld: 5.6 % of total Hgb (ref <5.7)
Mean Plasma Glucose: 114 (calc)
eAG (mmol/L): 6.3 (calc)

## 2017-07-30 LAB — MAGNESIUM: Magnesium: 2.1 mg/dL (ref 1.5–2.5)

## 2017-07-30 LAB — TSH: TSH: 1.15 mIU/L (ref 0.40–4.50)

## 2017-07-31 NOTE — Progress Notes (Signed)
Pt aware of lab results & voiced understanding of those results.

## 2018-02-05 DIAGNOSIS — Z1231 Encounter for screening mammogram for malignant neoplasm of breast: Secondary | ICD-10-CM | POA: Diagnosis not present

## 2018-07-19 DIAGNOSIS — Z01419 Encounter for gynecological examination (general) (routine) without abnormal findings: Secondary | ICD-10-CM | POA: Diagnosis not present

## 2018-07-19 DIAGNOSIS — Z6826 Body mass index (BMI) 26.0-26.9, adult: Secondary | ICD-10-CM | POA: Diagnosis not present

## 2018-07-29 ENCOUNTER — Encounter: Payer: Self-pay | Admitting: Physician Assistant

## 2018-07-29 DIAGNOSIS — R7309 Other abnormal glucose: Secondary | ICD-10-CM | POA: Insufficient documentation

## 2018-07-29 DIAGNOSIS — R7303 Prediabetes: Secondary | ICD-10-CM | POA: Insufficient documentation

## 2018-07-29 NOTE — Progress Notes (Signed)
Complete Physical  Assessment and Plan:  Mixed hyperlipidemia check lipids decrease fatty foods increase activity.  -     TSH -     Lipid panel -     EKG 12-Lead  Vitamin D deficiency -     VITAMIN D 25 Hydroxy (Vit-D Deficiency, Fractures)  Recurrent major depressive disorder, in partial remission ([facility]) Continue medications  Medication management -     CBC with Differential/Platelet -     COMPLETE METABOLIC PANEL WITH GFR -     Magnesium  Climacteric Continue follow up with Dr Nori Riis  Allergic state, initial encounter  Abnormal glucose -     Hemoglobin A1c  BMI 27.0-27.9,adult  Screening for blood or protein in urine -     Urinalysis, Routine w reflex microscopic -     Microalbumin / creatinine urine ratio  Other orders -     Tdap vaccine greater than or equal to 7yo IM    Discussed med's effects and SE's. Screening labs and tests as requested with regular follow-up as recommended.  HPI  62 y.o. female  presents for a complete physical.  Her blood pressure has been controlled at home, today their BP is BP: 124/72.  She does workout. She denies chest pain, shortness of breath, dizziness. She goes to [employer].   She has new jobs x 6 months, [employer].   She just started HRT with Dr. Nori Riis 2 weeks ago, she is still on lexapro only on 10mg  a day. She takes xanax 1/2 very rarely per patient.   She is on cholesterol medication and denies myalgias. Her cholesterol is not at goal. The cholesterol last visit was:  Lab Results  Component Value Date   CHOL 252 (H) 07/29/2017   HDL 76 07/29/2017   LDLCALC 149 (H) 07/29/2017   TRIG 145 07/29/2017   CHOLHDL 3.3 07/29/2017  . She has been working on diet and exercise for prediabetes, she is on bASA, she is not on ACE/ARB and denies foot ulcerations, hyperglycemia, hypoglycemia , increased appetite, nausea, paresthesia of the feet, polydipsia, polyuria, visual disturbances, vomiting and weight loss. Last A1C in the  office was:  Lab Results  Component Value Date   HGBA1C 5.6 07/29/2017   Patient is on Vitamin D supplement.   Lab Results  Component Value Date   VD25OH 37 07/29/2017     BMI is Body mass index is 27.13 kg/m., she is working on diet and exercise. Wt Readings from Last 3 Encounters:  08/03/18 143 lb 9.6 oz (65.1 kg)  07/29/17 136 lb 9.6 oz (62 kg)  04/14/17 137 lb (62.1 kg)     Current Medications:  Current Outpatient Medications on File Prior to Visit  Medication Sig Dispense Refill  . ALPRAZolam (XANAX) 0.5 MG tablet Take 0.5 mg by mouth 2 (two) times daily.    . calcium citrate-vitamin D 500-400 MG-UNIT chewable tablet Chew by mouth daily.    . cetirizine (ZYRTEC) 10 MG chewable tablet Chew 10 mg by mouth daily.    Marland Kitchen escitalopram (LEXAPRO) 20 MG tablet Take 20 mg by mouth daily.    Marland Kitchen estrogens, conjugated, (PREMARIN) 0.625 MG tablet Take 0.625 mg by mouth daily. Take daily for 21 days then do not take for 7 days.    . Magnesium 400 MG TABS Take 400 mg by mouth daily.    . meloxicam (MOBIC) 15 MG tablet Take 1 tablet (15 mg total) by mouth daily. 90 tablet 0  . Multiple Vitamins-Calcium (  ONE-A-DAY WOMENS PO) Take by mouth daily.    . Probiotic Product (PROBIOTIC DAILY PO) Take by mouth daily.    . progesterone (PROMETRIUM) 100 MG capsule Take 100 mg by mouth daily.    Marland Kitchen zolpidem (AMBIEN) 10 MG tablet Take 10 mg by mouth at bedtime as needed for sleep.    Marland Kitchen omeprazole (PRILOSEC) 40 MG capsule Take 1 tablet PO in morning 30 minutes prior to eating on an empty stomach. 14 capsule 1   No current facility-administered medications on file prior to visit.     Health Maintenance:   Immunization History  Administered Date(s) Administered  . Tdap 02/04/2008, 08/03/2018  . Zoster 12/28/2012   Tetanus: 2019 Zostavax: 2014 Pap:  07/2017, never abnormal, Dr. Nori Riis MGM: 2019  AT Dr. Nori Riis DEXA: 2017 Dr. Nori Riis Colonoscopy: 2009 due THIS YEAR Dr. Earlean Shawl Last Dental Exam: Dr.  Berdine Addison Last Eye Exam: Dr. Nicki Reaper, 2018  Patient Care Team: Unk Pinto, MD as PCP - General (Internal Medicine) Macarthur Critchley, [place] as Referring Physician (Optometry) Richmond Campbell, MD as Consulting Physician (Gastroenterology) Maisie Fus, MD as Consulting Physician (Obstetrics and Gynecology) Fayette Pho, MD as Referring Physician (Orthopedic Surgery) Lavonna Monarch, MD as Consulting Physician (Dermatology)  Medical History:  Past Medical History:  Diagnosis Date  . Allergy   . Depression    CONTROLLED WITH OCCASIONAL XANAX  . Fibrocystic breast disease   . Hyperlipidemia   . Osteopenia   . Prediabetes 09/05/2014  . Vitamin D deficiency    Allergies Allergies  Allergen Reactions  . Acyclovir And Related   . Eggs Or Egg-Derived Products Hives    Patient states she eats eggs with no problem.  Received propofol with no reaction.   . Neosporin [Neomycin-Bacitracin Zn-Polymyx] Hives  . Red Yeast Rice [Cholestin] Other (See Comments)    MYALGIA with higher doses  . Sulfa Antibiotics Hives  . Zostavax [Zoster Vaccine Live] Rash    SURGICAL HISTORY She  has a past surgical history that includes Dilation and curettage of uterus (2006); Novasure ablation (2006); Cervix lesion destruction; Skin cancer excision (Right, 10/2011); and Carpal tunnel release (Right, 12/06/2015). FAMILY HISTORY Her family history includes Cancer in her paternal uncle; Diabetes in her paternal grandfather; Heart disease in her father and paternal grandfather; Hyperlipidemia in her father and mother; Hypertension in her father and mother; Rheum arthritis in her mother. SOCIAL HISTORY She  reports that she quit smoking about 28 years ago. She has never used smokeless tobacco. She reports that she does not drink alcohol or use drugs.'  Review of Systems: Review of Systems  Constitutional: Negative for chills, fever and malaise/fatigue.  HENT: Negative for congestion, ear pain and sore throat.    Respiratory: Negative for cough, shortness of breath and wheezing.   Cardiovascular: Negative for chest pain, palpitations and leg swelling.  Gastrointestinal: Negative for abdominal pain, blood in stool, constipation, diarrhea, heartburn and melena.  Genitourinary: Negative.   Skin: Negative.   Neurological: Negative for dizziness, sensory change, loss of consciousness and headaches.  Psychiatric/Behavioral: Negative for depression. The patient is not nervous/anxious and does not have insomnia.     Physical Exam: Estimated body mass index is 27.13 kg/m as calculated from the following:   Height as of this encounter: 5\' 1"  (1.549 m).   Weight as of this encounter: 143 lb 9.6 oz (65.1 kg). BP 124/72   Pulse 66   Temp 97.6 F (36.4 C)   Resp 14   Ht 5\' 1"  (1.549  m)   Wt 143 lb 9.6 oz (65.1 kg)   SpO2 98%   BMI 27.13 kg/m   General Appearance: Well nourished well developed, in no apparent distress.  Eyes: PERRLA, EOMs, conjunctiva no swelling or erythema ENT/Mouth: Ear canals normal without obstruction, swelling, erythema, or discharge.  TMs normal bilaterally with no erythema, bulging, retraction, or loss of landmark.  Oropharynx moist and clear with no exudate, erythema, or swelling.   Neck: Supple, thyroid normal. No bruits.  No cervical adenopathy Respiratory: Respiratory effort normal, Breath sounds clear A&P without wheeze, rhonchi, rales.   Cardio: RRR without murmurs, rubs or gallops. Brisk peripheral pulses without edema.  Chest: symmetric, with normal excursions Breasts: Symmetric, without lumps, nipple discharge, retractions.  Abdomen: Soft, nontender, no guarding, rebound, hernias, masses, or organomegaly.  Lymphatics: Non tender without lymphadenopathy.  Musculoskeletal: Full ROM all peripheral extremities,5/5 strength, and normal gait.  Skin: Warm, dry without rashes, lesions, ecchymosis. Neuro: Awake and oriented X 3, Cranial nerves intact, reflexes equal  bilaterally. Normal muscle tone, no cerebellar symptoms. Sensation intact.  Psych:  normal affect, Insight and Judgment appropriate.   EKG: defer  AORTA SCAN: defer  Over 40 minutes of exam, counseling, chart review and critical decision making was performed  Vicie Mutters 10:17 AM [facility]

## 2018-08-03 ENCOUNTER — Encounter: Payer: Self-pay | Admitting: Physician Assistant

## 2018-08-03 ENCOUNTER — Ambulatory Visit (INDEPENDENT_AMBULATORY_CARE_PROVIDER_SITE_OTHER): Payer: BLUE CROSS/BLUE SHIELD | Admitting: Physician Assistant

## 2018-08-03 VITALS — BP 124/72 | HR 66 | Temp 97.6°F | Resp 14 | Ht 61.0 in | Wt 143.6 lb

## 2018-08-03 DIAGNOSIS — Z0000 Encounter for general adult medical examination without abnormal findings: Secondary | ICD-10-CM

## 2018-08-03 DIAGNOSIS — E559 Vitamin D deficiency, unspecified: Secondary | ICD-10-CM

## 2018-08-03 DIAGNOSIS — I10 Essential (primary) hypertension: Secondary | ICD-10-CM | POA: Diagnosis not present

## 2018-08-03 DIAGNOSIS — F3341 Major depressive disorder, recurrent, in partial remission: Secondary | ICD-10-CM

## 2018-08-03 DIAGNOSIS — Z79899 Other long term (current) drug therapy: Secondary | ICD-10-CM

## 2018-08-03 DIAGNOSIS — Z23 Encounter for immunization: Secondary | ICD-10-CM

## 2018-08-03 DIAGNOSIS — Z1329 Encounter for screening for other suspected endocrine disorder: Secondary | ICD-10-CM | POA: Diagnosis not present

## 2018-08-03 DIAGNOSIS — T7840XA Allergy, unspecified, initial encounter: Secondary | ICD-10-CM

## 2018-08-03 DIAGNOSIS — Z6827 Body mass index (BMI) 27.0-27.9, adult: Secondary | ICD-10-CM

## 2018-08-03 DIAGNOSIS — Z13220 Encounter for screening for lipoid disorders: Secondary | ICD-10-CM | POA: Diagnosis not present

## 2018-08-03 DIAGNOSIS — R7309 Other abnormal glucose: Secondary | ICD-10-CM

## 2018-08-03 DIAGNOSIS — E782 Mixed hyperlipidemia: Secondary | ICD-10-CM

## 2018-08-03 DIAGNOSIS — Z136 Encounter for screening for cardiovascular disorders: Secondary | ICD-10-CM | POA: Diagnosis not present

## 2018-08-03 DIAGNOSIS — Z131 Encounter for screening for diabetes mellitus: Secondary | ICD-10-CM | POA: Diagnosis not present

## 2018-08-03 DIAGNOSIS — N951 Menopausal and female climacteric states: Secondary | ICD-10-CM

## 2018-08-03 DIAGNOSIS — Z1389 Encounter for screening for other disorder: Secondary | ICD-10-CM

## 2018-08-03 NOTE — Patient Instructions (Addendum)
Take omeprazole over the counter for 2 weeks, then go to zantac 150-300 mg OR pepcid 20 or 40mg  at night for 2 weeks, then you can stop or continue as needed.  Avoid alcohol, spicy foods, NSAIDS (aleve, ibuprofen) at this time. See foods below.   Food Choices for Gastroesophageal Reflux Disease When you have gastroesophageal reflux disease (GERD), the foods you eat and your eating habits are very important. Choosing the right foods can help ease the discomfort of GERD. WHAT GENERAL GUIDELINES DO I NEED TO FOLLOW?  Choose fruits, vegetables, whole grains, low-fat dairy products, and low-fat meat, fish, and poultry.  Limit fats such as oils, salad dressings, butter, nuts, and avocado.  Keep a food diary to identify foods that cause symptoms.  Avoid foods that cause reflux. These may be different for different people.  Eat frequent small meals instead of three large meals each day.  Eat your meals slowly, in a relaxed setting.  Limit fried foods.  Cook foods using methods other than frying.  Avoid drinking alcohol.  Avoid drinking large amounts of liquids with your meals.  Avoid bending over or lying down until 2-3 hours after eating. WHAT FOODS ARE NOT RECOMMENDED? The following are some foods and drinks that may worsen your symptoms: Vegetables Tomatoes. Tomato juice. Tomato and spaghetti sauce. Chili peppers. Onion and garlic. Horseradish. Fruits Oranges, grapefruit, and lemon (fruit and juice). Meats High-fat meats, fish, and poultry. This includes hot dogs, ribs, ham, sausage, salami, and bacon. Dairy Whole milk and chocolate milk. Sour cream. Cream. Butter. Ice cream. Cream cheese.  Beverages Coffee and tea, with or without caffeine. Carbonated beverages or energy drinks. Condiments Hot sauce. Barbecue sauce.  Sweets/Desserts Chocolate and cocoa. Donuts. Peppermint and spearmint. Fats and Oils High-fat foods, including [place] fries and potato chips. Other Vinegar.  Strong spices, such as black pepper, white pepper, red pepper, cayenne, curry powder, cloves, ginger, and chili powder.  Your ears and sinuses are connected by the eustachian tube. When your sinuses are inflamed, this can close off the tube and cause fluid to collect in your middle ear. This can then cause dizziness, popping, clicking, ringing, and echoing in your ears. This is often NOT an infection and does NOT require antibiotics, it is caused by inflammation so the treatments help the inflammation. This can take a long time to get better so please be patient.  Here are things you can do to help with this: - Try the Flonase or Nasonex. Remember to spray each nostril twice towards the outer part of your eye.  Do not sniff but instead pinch your nose and tilt your head back to help the medicine get into your sinuses.  The best time to do this is at bedtime.Stop if you get blurred vision or nose bleeds.  -While drinking fluids, pinch and hold nose close and swallow, to help open eustachian tubes to drain fluid behind ear drums. -Please pick one of the over the counter allergy medications below and take it once daily for allergies.  It will also help with fluid behind ear drums. Claritin or loratadine cheapest but likely the weakest  Zyrtec or certizine at night because it can make you sleepy The strongest is allegra or fexafinadine  Cheapest at [employer], [employer], [employer] -can use decongestant over the counter, please do not use if you have high blood pressure or certain heart conditions.   if worsening HA, changes vision/speech, imbalance, weakness go to the ER

## 2018-08-04 LAB — LIPID PANEL
Cholesterol: 216 mg/dL — ABNORMAL HIGH (ref <200)
HDL: 67 mg/dL (ref >50)
LDL Cholesterol (Calc): 106 mg/dL (calc) — ABNORMAL HIGH
Non-HDL Cholesterol (Calc): 149 mg/dL (calc) — ABNORMAL HIGH (ref <130)
Total CHOL/HDL Ratio: 3.2 (calc) (ref <5.0)
Triglycerides: 319 mg/dL — ABNORMAL HIGH (ref <150)

## 2018-08-04 LAB — COMPLETE METABOLIC PANEL WITH GFR
AG Ratio: 1.8 (calc) (ref 1.0–2.5)
ALT: 11 U/L (ref 6–29)
AST: 16 U/L (ref 10–35)
Albumin: 4.4 g/dL (ref 3.6–5.1)
Alkaline phosphatase (APISO): 52 U/L (ref 33–130)
BUN: 15 mg/dL (ref 7–25)
CO2: 30 mmol/L (ref 20–32)
Calcium: 9.6 mg/dL (ref 8.6–10.4)
Chloride: 105 mmol/L (ref 98–110)
Creat: 0.88 mg/dL (ref 0.50–0.99)
GFR, Est African American: 82 mL/min/{1.73_m2} (ref >=60)
GFR, Est Non African American: 71 mL/min/{1.73_m2} (ref >=60)
Globulin: 2.4 g/dL (calc) (ref 1.9–3.7)
Glucose, Bld: 129 mg/dL — ABNORMAL HIGH (ref 65–99)
Potassium: 4.3 mmol/L (ref 3.5–5.3)
Sodium: 144 mmol/L (ref 135–146)
Total Bilirubin: 0.3 mg/dL (ref 0.2–1.2)
Total Protein: 6.8 g/dL (ref 6.1–8.1)

## 2018-08-04 LAB — CBC WITH DIFFERENTIAL/PLATELET
Basophils Absolute: 50 cells/uL (ref 0–200)
Basophils Relative: 0.8 %
Eosinophils Absolute: 99 cells/uL (ref 15–500)
Eosinophils Relative: 1.6 %
HCT: 38.9 % (ref 35.0–45.0)
Hemoglobin: 13.1 g/dL (ref 11.7–15.5)
Lymphs Abs: 2356 cells/uL (ref 850–3900)
MCH: 32.5 pg (ref 27.0–33.0)
MCHC: 33.7 g/dL (ref 32.0–36.0)
MCV: 96.5 fL (ref 80.0–100.0)
MPV: 11.5 fL (ref 7.5–12.5)
Monocytes Relative: 5.3 %
Neutro Abs: 3367 cells/uL (ref 1500–7800)
Neutrophils Relative %: 54.3 %
Platelets: 260 10*3/uL (ref 140–400)
RBC: 4.03 10*6/uL (ref 3.80–5.10)
RDW: 12.7 % (ref 11.0–15.0)
Total Lymphocyte: 38 %
WBC mixed population: 329 cells/uL (ref 200–950)
WBC: 6.2 10*3/uL (ref 3.8–10.8)

## 2018-08-04 LAB — TSH: TSH: 1.36 mIU/L (ref 0.40–4.50)

## 2018-08-04 LAB — MICROALBUMIN / CREATININE URINE RATIO
Creatinine, Urine: 56 mg/dL (ref 20–275)
Microalb, Ur: <0.2 mg/dL

## 2018-08-04 LAB — HEMOGLOBIN A1C
Hgb A1c MFr Bld: 5.7 % of total Hgb — ABNORMAL HIGH (ref <5.7)
Mean Plasma Glucose: 117 (calc)
eAG (mmol/L): 6.5 (calc)

## 2018-08-04 LAB — MAGNESIUM: Magnesium: 2.1 mg/dL (ref 1.5–2.5)

## 2018-08-04 LAB — URINALYSIS, ROUTINE W REFLEX MICROSCOPIC
Bilirubin Urine: NEGATIVE
Glucose, UA: NEGATIVE
Hgb urine dipstick: NEGATIVE
Ketones, ur: NEGATIVE
Leukocytes, UA: NEGATIVE
Nitrite: NEGATIVE
Protein, ur: NEGATIVE
Specific Gravity, Urine: 1.013 (ref 1.001–1.03)
pH: 7.5 (ref 5.0–8.0)

## 2018-08-04 LAB — VITAMIN D 25 HYDROXY (VIT D DEFICIENCY, FRACTURES): Vit D, 25-Hydroxy: 28 ng/mL — ABNORMAL LOW (ref 30–100)

## 2018-08-12 ENCOUNTER — Encounter: Payer: Self-pay | Admitting: Physician Assistant

## 2018-08-18 DIAGNOSIS — N950 Postmenopausal bleeding: Secondary | ICD-10-CM | POA: Diagnosis not present

## 2018-08-24 DIAGNOSIS — L821 Other seborrheic keratosis: Secondary | ICD-10-CM | POA: Diagnosis not present

## 2018-08-24 DIAGNOSIS — D229 Melanocytic nevi, unspecified: Secondary | ICD-10-CM | POA: Diagnosis not present

## 2018-12-09 ENCOUNTER — Encounter: Payer: Self-pay | Admitting: Adult Health

## 2018-12-09 ENCOUNTER — Ambulatory Visit (INDEPENDENT_AMBULATORY_CARE_PROVIDER_SITE_OTHER): Payer: BLUE CROSS/BLUE SHIELD | Admitting: Adult Health

## 2018-12-09 VITALS — BP 142/88 | HR 64 | Temp 97.7°F | Ht 61.0 in | Wt 145.0 lb

## 2018-12-09 DIAGNOSIS — M533 Sacrococcygeal disorders, not elsewhere classified: Secondary | ICD-10-CM

## 2018-12-09 MED ORDER — PREDNISONE 20 MG PO TABS: ORAL_TABLET | ORAL | 0 refills | Status: DC

## 2018-12-09 MED ORDER — CYCLOBENZAPRINE HCL 5 MG PO TABS: 5.0000 mg | ORAL_TABLET | Freq: Three times a day (TID) | ORAL | 0 refills | Status: DC | PRN

## 2018-12-09 NOTE — Patient Instructions (Signed)
Back Exercises If you have pain in your back, do these exercises 2-3 times each day or as told by your doctor. When the pain goes away, do the exercises once each day, but repeat the steps more times for each exercise (do more repetitions). If you do not have pain in your back, do these exercises once each day or as told by your doctor. Exercises Single Knee to Chest Do these steps 3-5 times in a row for each leg: 1. Lie on your back on a firm bed or the floor with your legs stretched out. 2. Bring one knee to your chest. 3. Hold your knee to your chest by grabbing your knee or thigh. 4. Pull on your knee until you feel a gentle stretch in your lower back. 5. Keep doing the stretch for 10-30 seconds. 6. Slowly let go of your leg and straighten it. Pelvic Tilt Do these steps 5-10 times in a row: 1. Lie on your back on a firm bed or the floor with your legs stretched out. 2. Bend your knees so they point up to the ceiling. Your feet should be flat on the floor. 3. Tighten your lower belly (abdomen) muscles to press your lower back against the floor. This will make your tailbone point up to the ceiling instead of pointing down to your feet or the floor. 4. Stay in this position for 5-10 seconds while you gently tighten your muscles and breathe evenly. Cat-Cow Do these steps until your lower back bends more easily: 1. Get on your hands and knees on a firm surface. Keep your hands under your shoulders, and keep your knees under your hips. You may put padding under your knees. 2. Let your head hang down, and make your tailbone point down to the floor so your lower back is round like the back of a cat. 3. Stay in this position for 5 seconds. 4. Slowly lift your head and make your tailbone point up to the ceiling so your back hangs low (sags) like the back of a cow. 5. Stay in this position for 5 seconds.  Press-Ups Do these steps 5-10 times in a row: 1. Lie on your belly (face-down) on the  floor. 2. Place your hands near your head, about shoulder-width apart. 3. While you keep your back relaxed and keep your hips on the floor, slowly straighten your arms to raise the top half of your body and lift your shoulders. Do not use your back muscles. To make yourself more comfortable, you may change where you place your hands. 4. Stay in this position for 5 seconds. 5. Slowly return to lying flat on the floor.  Bridges Do these steps 10 times in a row: 1. Lie on your back on a firm surface. 2. Bend your knees so they point up to the ceiling. Your feet should be flat on the floor. 3. Tighten your butt muscles and lift your butt off of the floor until your waist is almost as high as your knees. If you do not feel the muscles working in your butt and the back of your thighs, slide your feet 1-2 inches farther away from your butt. 4. Stay in this position for 3-5 seconds. 5. Slowly lower your butt to the floor, and let your butt muscles relax. If this exercise is too easy, try doing it with your arms crossed over your chest. Belly Crunches Do these steps 5-10 times in a row: 1. Lie on your back on a  firm bed or the floor with your legs stretched out. 2. Bend your knees so they point up to the ceiling. Your feet should be flat on the floor. 3. Cross your arms over your chest. 4. Tip your chin a little bit toward your chest but do not bend your neck. 5. Tighten your belly muscles and slowly raise your chest just enough to lift your shoulder blades a tiny bit off of the floor. 6. Slowly lower your chest and your head to the floor. Back Lifts Do these steps 5-10 times in a row: 1. Lie on your belly (face-down) with your arms at your sides, and rest your forehead on the floor. 2. Tighten the muscles in your legs and your butt. 3. Slowly lift your chest off of the floor while you keep your hips on the floor. Keep the back of your head in line with the curve in your back. Look at the floor  while you do this. 4. Stay in this position for 3-5 seconds. 5. Slowly lower your chest and your face to the floor. Contact a doctor if:  Your back pain gets a lot worse when you do an exercise.  Your back pain does not lessen 2 hours after you exercise. If you have any of these problems, stop doing the exercises. Do not do them again unless your doctor says it is okay. Get help right away if:  You have sudden, very bad back pain. If this happens, stop doing the exercises. Do not do them again unless your doctor says it is okay. This information is not intended to replace advice given to you by your health care provider. Make sure you discuss any questions you have with your health care provider. Document Released: 12/27/2010 Document Revised: 08/18/2018 Document Reviewed: 01/18/2015 Elsevier Interactive Patient Education  2019 Elsevier Inc.   Cyclobenzaprine tablets What is this medicine? CYCLOBENZAPRINE (sye kloe BEN za preen) is a muscle relaxer. It is used to treat muscle pain, spasms, and stiffness. This medicine may be used for other purposes; ask your health care provider or pharmacist if you have questions. COMMON BRAND NAME(S): Fexmid, Flexeril What should I tell my health care provider before I take this medicine? They need to know if you have any of these conditions: -heart disease, irregular heartbeat, or previous heart attack -liver disease -thyroid problem -an unusual or allergic reaction to cyclobenzaprine, tricyclic antidepressants, lactose, other medicines, foods, dyes, or preservatives -pregnant or trying to get pregnant -breast-feeding How should I use this medicine? Take this medicine by mouth with a glass of water. Follow the directions on the prescription label. If this medicine upsets your stomach, take it with food or milk. Take your medicine at regular intervals. Do not take it more often than directed. Talk to your pediatrician regarding the use of this  medicine in children. Special care may be needed. Overdosage: If you think you have taken too much of this medicine contact a poison control center or emergency room at once. NOTE: This medicine is only for you. Do not share this medicine with others. What if I miss a dose? If you miss a dose, take it as soon as you can. If it is almost time for your next dose, take only that dose. Do not take double or extra doses. What may interact with this medicine? Do not take this medicine with any of the following medications: -MAOIs like Carbex, Eldepryl, Marplan, Nardil, and Parnate This medicine may also interact with the following  medications: -alcohol -antihistamines for allergy, cough, and cold -certain medicines for anxiety or sleep -certain medicines for depression like amitriptyline, fluoxetine, sertraline -certain medicines for seizures like phenobarbital, primidone -contrast dyes -local anesthetics like lidocaine, pramoxine, tetracaine -medicines that relax muscles for surgery -narcotic medicines for pain -phenothiazines like chlorpromazine, mesoridazine, prochlorperazine This list may not describe all possible interactions. Give your health care provider a list of all the medicines, herbs, non-prescription drugs, or dietary supplements you use. Also tell them if you smoke, drink alcohol, or use illegal drugs. Some items may interact with your medicine. What should I watch for while using this medicine? Tell your doctor or health care professional if your symptoms do not start to get better or if they get worse. You may get drowsy or dizzy. Do not drive, use machinery, or do anything that needs mental alertness until you know how this medicine affects you. Do not stand or sit up quickly, especially if you are an older patient. This reduces the risk of dizzy or fainting spells. Alcohol may interfere with the effect of this medicine. Avoid alcoholic drinks. If you are taking another medicine  that also causes drowsiness, you may have more side effects. Give your health care provider a list of all medicines you use. Your doctor will tell you how much medicine to take. Do not take more medicine than directed. Call emergency for help if you have problems breathing or unusual sleepiness. Your mouth may get dry. Chewing sugarless gum or sucking hard candy, and drinking plenty of water may help. Contact your doctor if the problem does not go away or is severe. What side effects may I notice from receiving this medicine? Side effects that you should report to your doctor or health care professional as soon as possible: -allergic reactions like skin rash, itching or hives, swelling of the face, lips, or tongue -breathing problems -chest pain -fast, irregular heartbeat -hallucinations -seizures -unusually weak or tired Side effects that usually do not require medical attention (report to your doctor or health care professional if they continue or are bothersome): -headache -nausea, vomiting This list may not describe all possible side effects. Call your doctor for medical advice about side effects. You may report side effects to FDA at 1-800-FDA-1088. Where should I keep my medicine? Keep out of the reach of children. Store at room temperature between 15 and 30 degrees C (59 and 86 degrees F). Keep container tightly closed. Throw away any unused medicine after the expiration date. NOTE: This sheet is a summary. It may not cover all possible information. If you have questions about this medicine, talk to your doctor, pharmacist, or health care provider.  2019 Elsevier/Gold Standard (2017-09-16 13:04:35)

## 2018-12-09 NOTE — Progress Notes (Signed)
Assessment and Plan:  Jamaris was seen today for hip pain.  Diagnoses and all orders for this visit:  Sacral back pain - negative straight leg, no radicular symptoms or red flags Prednisone was prescribed,NSAIDs, RICE, and exercise given If not better follow up in office or will refer to PT/orthopedics. Natural history and expected course discussed. Questions answered. Neurosurgeon distributed. Proper lifting, bending technique discussed. Stretching exercises discussed. Heat to affected area as needed for local pain relief. Muscle relaxants per medication orders. -     predniSONE (DELTASONE) 20 MG tablet; 2 tablets daily for 3 days, 1 tablet daily for 4 days. -     cyclobenzaprine (FLEXERIL) 5 MG tablet; Take 1 tablet (5 mg total) by mouth 3 (three) times daily as needed for muscle spasms.  Further disposition pending results of labs. Discussed med's effects and SE's.   Over 15 minutes of exam, counseling, chart review, and critical decision making was performed.   Future Appointments  Date Time Provider [place]  08/23/2019  9:00 AM Vicie Mutters, PA-C [facility] None    ------------------------------------------------------------------------------------------------------------------   HPI BP (!) 142/88   Pulse 64   Temp 97.7 F (36.5 C)   Ht 5\' 1"  (1.549 m)   Wt 145 lb (65.8 kg)   SpO2 97%   BMI 27.40 kg/m   62 y.o.female presents for evaluation of lower back/hip that is reportedly ongoing for 2-3 months. She reports this began very gradually, getting worse, recently is constant, aching sensation, 5/10, non-radiating. She has tried applying ice, heat, stretching exercises. She has tried aleve, has been taking 1 tab every 12 hours regularly for several days. She reports this helped initially but not in the past few days. Denies abdominal pain, constipation, fatigue, weight loss, urinary changes. Denies loss of bladder or bowel control.   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 Allergy   . Depression    CONTROLLED WITH OCCASIONAL XANAX  . Fibrocystic breast disease   . Hyperlipidemia   . Osteopenia   . Prediabetes 09/05/2014  . Vitamin D deficiency      Allergies  Allergen Reactions  . Acyclovir And Related   . Eggs Or Egg-Derived Products Hives    Patient states she eats eggs with no problem.  Received propofol with no reaction.   . Neosporin [Neomycin-Bacitracin Zn-Polymyx] Hives  . Red Yeast Rice [Cholestin] Other (See Comments)    MYALGIA with higher doses  . Sulfa Antibiotics Hives  . Zostavax [Zoster Vaccine Live] Rash    Current Outpatient Medications on File Prior to Visit  Medication Sig  . ALPRAZolam (XANAX) 0.5 MG tablet Take 0.5 mg by mouth 2 (two) times daily.  . calcium citrate-vitamin D 500-400 MG-UNIT chewable tablet Chew by mouth daily.  . cetirizine (ZYRTEC) 10 MG chewable tablet Chew 10 mg by mouth daily.  Marland Kitchen escitalopram (LEXAPRO) 20 MG tablet Take 20 mg by mouth daily.  Marland Kitchen estrogens, conjugated, (PREMARIN) 0.625 MG tablet Take 0.625 mg by mouth daily. Take daily for 21 days then do not take for 7 days.  . Magnesium 400 MG TABS Take 400 mg by mouth daily.  . meloxicam (MOBIC) 15 MG tablet Take 1 tablet (15 mg total) by mouth daily.  . Multiple Vitamins-Calcium (ONE-A-DAY WOMENS PO) Take by mouth daily.  Marland Kitchen omeprazole (PRILOSEC) 40 MG capsule Take 1 tablet  PO in morning 30 minutes prior to eating on an empty stomach. (Patient taking differently: as needed. Take 1 tablet PO in morning 30 minutes prior to eating on an empty stomach.)  . Probiotic Product (PROBIOTIC DAILY PO) Take by mouth daily.  . progesterone (PROMETRIUM) 100 MG capsule Take 100 mg by mouth daily.  Marland Kitchen zolpidem (AMBIEN) 10 MG tablet Take 10 mg by mouth at bedtime as needed for sleep.   No current  facility-administered medications on file prior to visit.     ROS: all negative except above.   Physical Exam:  BP (!) 142/88   Pulse 64   Temp 97.7 F (36.5 C)   Ht 5\' 1"  (1.549 m)   Wt 145 lb (65.8 kg)   SpO2 97%   BMI 27.40 kg/m   General Appearance: Well nourished, in no apparent distress. Eyes: conjunctiva no swelling or erythema ENT/Mouth: Hearing normal.  Neck: Supple Respiratory: Respiratory effort normalCardio: RRR with no MRGs. Brisk peripheral pulses without edema.  Abdomen: Soft, + BS.  Non tender, no guarding, rebound, hernias, masses. Lymphatics: Non tender without lymphadenopathy.  Musculoskeletal: Full ROM through lumbar back and hips, 5/5 strength, normal gait. No SI joint tenderness, no crepitus, no greater trochanter tenderness, neg leg raise Skin: Warm, dry without rashes, lesions, ecchymosis.  Neuro: Normal muscle tone, no cerebellar symptoms. Sensation intact.  Psych: Awake and oriented X 3, normal affect, Insight and Judgment appropriate.    Izora Ribas, NP 3:54 PM [facility]

## 2019-02-16 DIAGNOSIS — Z1231 Encounter for screening mammogram for malignant neoplasm of breast: Secondary | ICD-10-CM | POA: Diagnosis not present

## 2019-04-07 DIAGNOSIS — N393 Stress incontinence (female) (male): Secondary | ICD-10-CM | POA: Diagnosis not present

## 2019-05-18 DIAGNOSIS — N3942 Incontinence without sensory awareness: Secondary | ICD-10-CM | POA: Diagnosis not present

## 2019-05-18 DIAGNOSIS — N393 Stress incontinence (female) (male): Secondary | ICD-10-CM | POA: Diagnosis not present

## 2019-05-18 DIAGNOSIS — R8271 Bacteriuria: Secondary | ICD-10-CM | POA: Diagnosis not present

## 2019-06-23 DIAGNOSIS — N393 Stress incontinence (female) (male): Secondary | ICD-10-CM | POA: Diagnosis not present

## 2019-06-23 DIAGNOSIS — N3942 Incontinence without sensory awareness: Secondary | ICD-10-CM | POA: Diagnosis not present

## 2019-07-21 DIAGNOSIS — Z6827 Body mass index (BMI) 27.0-27.9, adult: Secondary | ICD-10-CM | POA: Diagnosis not present

## 2019-07-21 DIAGNOSIS — Z01419 Encounter for gynecological examination (general) (routine) without abnormal findings: Secondary | ICD-10-CM | POA: Diagnosis not present

## 2019-08-09 ENCOUNTER — Encounter: Payer: Self-pay | Admitting: Physician Assistant

## 2019-08-18 ENCOUNTER — Other Ambulatory Visit: Payer: Self-pay | Admitting: *Deleted

## 2019-08-18 DIAGNOSIS — R6889 Other general symptoms and signs: Secondary | ICD-10-CM | POA: Diagnosis not present

## 2019-08-18 DIAGNOSIS — Z20822 Contact with and (suspected) exposure to covid-19: Secondary | ICD-10-CM

## 2019-08-19 LAB — NOVEL CORONAVIRUS, NAA: SARS-CoV-2, NAA: NOT DETECTED

## 2019-08-23 ENCOUNTER — Encounter: Payer: Self-pay | Admitting: Physician Assistant

## 2019-08-24 ENCOUNTER — Telehealth: Payer: Self-pay | Admitting: *Deleted

## 2019-08-24 NOTE — Telephone Encounter (Signed)
Per Irving Shows, it is OK for the patient to receive the Pneumovax injection. A message was left to inform the patient.

## 2019-10-06 ENCOUNTER — Other Ambulatory Visit: Payer: Self-pay

## 2019-10-06 ENCOUNTER — Ambulatory Visit (INDEPENDENT_AMBULATORY_CARE_PROVIDER_SITE_OTHER): Payer: BC Managed Care – PPO | Admitting: Adult Health Nurse Practitioner

## 2019-10-06 ENCOUNTER — Encounter: Payer: Self-pay | Admitting: Adult Health Nurse Practitioner

## 2019-10-06 VITALS — BP 130/76 | HR 63 | Temp 97.5°F | Wt 147.0 lb

## 2019-10-06 DIAGNOSIS — D492 Neoplasm of unspecified behavior of bone, soft tissue, and skin: Secondary | ICD-10-CM | POA: Diagnosis not present

## 2019-10-06 DIAGNOSIS — M7130 Other bursal cyst, unspecified site: Secondary | ICD-10-CM

## 2019-10-06 NOTE — Progress Notes (Signed)
Assessment and Plan:  Norma Cameron was seen today for acute visit.  Diagnoses and all orders for this visit:   Abnormal skin growth Myxoid cyst Left pointer finger Drained, Cryo see procedure Tolerated well Monitor are, keep clean & dry May take two weeks to completely heal. Cover if potential for contaminant exposure.   Call or return with any new or worsening symptoms    Further disposition pending results of labs. Discussed med's effects and SE's.   Over 20 minutes of interview, exam, counseling, chart review, and critical decision making was performed.   Future Appointments  Date Time Provider [place]  12/12/2019  2:00 PM Vicie Mutters, PA-C [facility] None    ------------------------------------------------------------------------------------------------------------------   HPI 63 y.o.female presents for evaluation of left finger.  She noticed this about two weeks ago.  She reports it was itchy itially and red and throbbing.  She took a pin and picked the center and clear fluid came out of the center. She drained and reports it felt much better.  Denies any bleeding or bad smelling drainage.  Denies any numbess or tingling.  She reports over the course of the past week this has increased again with fluid collection and larger in size than initial.  She has never had anything like this in the past.  Past Medical History:  Diagnosis Date  . Allergy   . 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 BCC (basal cell carcinoma of skin) 10/07/2011   Right Inner Shin  . Depression    CONTROLLED WITH OCCASIONAL XANAX  . Fibrocystic breast disease   . Hyperlipidemia   . Osteopenia   . Prediabetes 09/05/2014  . Vitamin D deficiency      Allergies  Allergen Reactions  . Acyclovir And Related   . Eggs Or Egg-Derived Products Hives    Patient states she eats eggs with no problem.  Received propofol with no reaction.   . Neosporin [Neomycin-Bacitracin  Zn-Polymyx] Hives  . Red Yeast Rice [Cholestin] Other (See Comments)    MYALGIA with higher doses  . Sulfa Antibiotics Hives  . Zostavax [Zoster Vaccine Live] Rash    Current Outpatient Medications on File Prior to Visit  Medication Sig  . ALPRAZolam (XANAX) 0.5 MG tablet Take 0.5 mg by mouth 2 (two) times daily.  . calcium citrate-vitamin D 500-400 MG-UNIT chewable tablet Chew by mouth daily.  . cetirizine (ZYRTEC) 10 MG chewable tablet Chew 10 mg by mouth daily.  . cyclobenzaprine (FLEXERIL) 5 MG tablet Take 1 tablet (5 mg total) by mouth 3 (three) times daily as needed for muscle spasms.  Marland Kitchen escitalopram (LEXAPRO) 20 MG tablet Take 20 mg by mouth daily.  Marland Kitchen estrogens, conjugated, (PREMARIN) 0.625 MG tablet Take 0.625 mg by mouth daily. Take daily for 21 days then do not take for 7 days.  . Magnesium 400 MG TABS Take 400 mg by mouth daily.  . meloxicam (MOBIC) 15 MG tablet Take 1 tablet (15 mg total) by mouth daily.  . Multiple Vitamins-Calcium (ONE-A-DAY WOMENS PO) Take by mouth daily.  . Probiotic Product (PROBIOTIC DAILY PO) Take by mouth daily.  . progesterone (PROMETRIUM) 100 MG capsule Take 100 mg by mouth daily.  Marland Kitchen zolpidem (AMBIEN) 10 MG tablet Take 10 mg by mouth at bedtime as needed for sleep.  Marland Kitchen omeprazole (PRILOSEC) 40 MG capsule Take 1 tablet PO in morning 30 minutes prior to eating on an empty stomach. (Patient taking differently: as needed. Take 1 tablet PO in  morning 30 minutes prior to eating on an empty stomach.)   No current facility-administered medications on file prior to visit.     ROS: all negative except above.   Physical Exam:  BP 130/76   Pulse 63   Temp (!) 97.5 F (36.4 C)   Wt 147 lb (66.7 kg)   SpO2 97%   BMI 27.78 kg/m   General Appearance: Well nourished, in no apparent distress. Neck: Supple, thyroid normal.  Respiratory: Respiratory effort normal, BS equal bilaterally without rales, rhonchi, wheezing or stridor.  Cardio: RRR with no MRGs.  Brisk peripheral pulses without edema.  Musculoskeletal: Full ROM, 5/5 strength, normal gait.  Skin: Warm, dry without rashes, ecchymosis. Single raised vesicle at [place]. Neuro: Cranial nerves intact. Normal muscle tone, no cerebellar symptoms. Sensation intact.  Psych: Awake and oriented X 3, normal affect, Insight and Judgment appropriate.     Chief Complaint: Patient presents for evaluation of skin lesions. Patient has erythematous, scaly lesions on finger, changing in shape.   Exam: normal complete skin exam, no suspicious lesions elsewahere.  Left pointer finger.  Procedure Details   The risks, benefits, indications, potential complications, and alternatives were explained to the patient and informed consent obtained.  Skin cleansed with alcohol, top of vesicle punctured and drained.  Clear serous fluid noted. Liquid nitrogen was use in a  Double freeze and thaw technique. The patient tolerated the procedure well.   Condition: Stable  Complications:  None  Procedure code:  17000   Plan: 1. Patient educated that the area will begin to heal in approximately a week.  2. Warning signs of infection were reviewed.    3. Recommended that the patient use OTC acetaminophen as needed for pain.       Norma Sierras, NP 12:20 PM [facility]

## 2019-10-06 NOTE — Patient Instructions (Signed)
Myoxoid Cycst.   Monitor your finger for signs of infection as this was drained today..  Redness, increasing tenderness or bad smelling drainage are signs of infection.    We did Cryo therapy on the area today.   The skin will peel/flake off in 1-2 weeks.  Please contact the office if this returns or you have any new or worsening symptoms.

## 2019-10-10 ENCOUNTER — Encounter: Payer: Self-pay | Admitting: Adult Health Nurse Practitioner

## 2019-11-08 DIAGNOSIS — M8580 Other specified disorders of bone density and structure, unspecified site: Secondary | ICD-10-CM | POA: Diagnosis not present

## 2019-11-08 DIAGNOSIS — Z13820 Encounter for screening for osteoporosis: Secondary | ICD-10-CM | POA: Diagnosis not present

## 2019-12-07 NOTE — Progress Notes (Signed)
Complete Physical  Assessment and Plan:  Encounter for general adult medical examination with abnormal findings -     CBC with Diff -     COMPLETE METABOLIC PANEL WITH GFR -     TSH -     Lipid Profile -     Hemoglobin A1c ([facility]) -     Magnesium -     Vitamin D (25 hydroxy) -     Urinalysis, Routine w reflex microscopic -     Microalbumin / Creatinine Urine Ratio -     omeprazole (PRILOSEC) 40 MG capsule; Take 1 tablet PO in morning 30 minutes prior to eating on an empty stomach.  Mixed hyperlipidemia check lipids decrease fatty foods increase activity.  -     TSH -     Lipid Profile  Recurrent major depressive disorder, in partial remission (HCC) Continue lexapro 10 mg  Vitamin D deficiency -     Vitamin D (25 hydroxy)  Medication management -     CBC with Diff -     COMPLETE METABOLIC PANEL WITH GFR -     Magnesium  Allergy, initial encounter Continue OTC meds  Climacteric Follows with Dr. Nori Riis  Abnormal glucose -     Hemoglobin A1c ([facility]) Discussed disease progression and risks Discussed diet/exercise, weight management and risk modification  Screening for blood or protein in urine -     Urinalysis, Routine w reflex microscopic -     Microalbumin / Creatinine Urine Ratio  Gastroesophageal reflux disease without esophagitis Refilled Prilosec 40 mg to pharamcy   Discussed med's effects and SE's. Screening labs and tests as requested with regular follow-up as recommended.  HPI  63 y.o. female  presents for a rescheduled complete physical, late due to husband having non covid double pneumonia in Sept.  Her blood pressure has been controlled at home, today their BP is BP: 118/74.  She does workout, since the pandemic she is not attending classes but she is walking/riding bikes. She denies chest pain, shortness of breath, dizziness.  BMI is Body mass index is 27.07 kg/m., she is working on diet and exercise. Wt Readings from Last 3 Encounters:   12/12/19 148 lb (67.1 kg)  10/06/19 147 lb (66.7 kg)  12/09/18 145 lb (65.8 kg)   She works with [employer], does everything over the phone in the office.   She just started HRT with Dr. Nori Riis 2 weeks ago, she is still on lexapro only on 10mg  a day. She takes xanax 1/2 very rarely per patient.  Had DEXA 11/2019, MGM 03/2019, PAP 07/2019 per patient- normal.   She is not on cholesterol medication and denies myalgias. Her cholesterol is not at goal. The cholesterol last visit was:  Lab Results  Component Value Date   CHOL 216 (H) 08/03/2018   HDL 67 08/03/2018   LDLCALC 106 (H) 08/03/2018   TRIG 319 (H) 08/03/2018   CHOLHDL 3.2 08/03/2018  . She has been working on diet and exercise for prediabetes, she is on bASA, she is not on ACE/ARB and denies foot ulcerations, hyperglycemia, hypoglycemia , increased appetite, nausea, paresthesia of the feet, polydipsia, polyuria, visual disturbances, vomiting and weight loss. Last A1C in the office was:  Lab Results  Component Value Date   HGBA1C 5.7 (H) 08/03/2018   Patient is on Vitamin D supplement, vitamin D/calcium combo and vitamin D 2000 IU.    Lab Results  Component Value Date   VD25OH 28 (L) 08/03/2018  Current Medications:  Current Outpatient Medications on File Prior to Visit  Medication Sig Dispense Refill  . ALPRAZolam (XANAX) 0.5 MG tablet Take 0.5 mg by mouth 2 (two) times daily.    . calcium citrate-vitamin D 500-400 MG-UNIT chewable tablet Chew by mouth daily.    . cetirizine (ZYRTEC) 10 MG chewable tablet Chew 10 mg by mouth daily.    Marland Kitchen escitalopram (LEXAPRO) 20 MG tablet Take 20 mg by mouth daily.    . Magnesium 400 MG TABS Take 400 mg by mouth daily.    . meloxicam (MOBIC) 15 MG tablet Take 1 tablet (15 mg total) by mouth daily. 90 tablet 0  . Multiple Vitamins-Calcium (ONE-A-DAY WOMENS PO) Take by mouth daily.    . Probiotic Product (PROBIOTIC DAILY PO) Take by mouth daily.    . progesterone (PROMETRIUM) 100 MG  capsule Take 100 mg by mouth daily.    Marland Kitchen zolpidem (AMBIEN) 10 MG tablet Take 10 mg by mouth at bedtime as needed for sleep.    Marland Kitchen omeprazole (PRILOSEC) 40 MG capsule Take 1 tablet PO in morning 30 minutes prior to eating on an empty stomach. (Patient taking differently: as needed. Take 1 tablet PO in morning 30 minutes prior to eating on an empty stomach.) 14 capsule 1   No current facility-administered medications on file prior to visit.    Health Maintenance:   Immunization History  Administered Date(s) Administered  . Tdap 02/04/2008, 08/03/2018  . Zoster 12/28/2012   Tetanus: 2019 Influenza ALLERGY to EGGS Zostavax: 2014 suggest new one before 70  Pap:  07/2019, never abnormal, Dr. Nori Riis MGM: 2020  AT Dr. Nori Riis DEXA: 2020 Dr. Nori Riis Colonoscopy: 2009 due will get Dr. Earlean Shawl- will scedule Last Dental Exam: Dr. Berdine Addison Last Eye Exam: Dr. Nicki Reaper, 2018  Patient Care Team: Unk Pinto, MD as PCP - General (Internal Medicine) Macarthur Critchley, [place] as Referring Physician (Optometry) Richmond Campbell, MD as Consulting Physician (Gastroenterology) Maisie Fus, MD as Consulting Physician (Obstetrics and Gynecology) Fayette Pho, MD as Referring Physician (Orthopedic Surgery) Lavonna Monarch, MD as Consulting Physician (Dermatology)  Medical History:  Past Medical History:  Diagnosis Date  . Allergy   . 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 BCC (basal cell carcinoma of skin) 10/07/2011   Right Inner Shin  . Depression    CONTROLLED WITH OCCASIONAL XANAX  . Fibrocystic breast disease   . Hyperlipidemia   . Osteopenia   . Prediabetes 09/05/2014  . Vitamin D deficiency    Allergies Allergies  Allergen Reactions  . Acyclovir And Related   . Eggs Or Egg-Derived Products Hives    Patient states she eats eggs with no problem.  Received propofol with no reaction.   . Neosporin [Neomycin-Bacitracin Zn-Polymyx] Hives  . Red Yeast Rice [Cholestin] Other (See  Comments)    MYALGIA with higher doses  . Sulfa Antibiotics Hives  . Zostavax [Zoster Vaccine Live] Rash    SURGICAL HISTORY She  has a past surgical history that includes Dilation and curettage of uterus (2006); Novasure ablation (2006); Cervix lesion destruction; Skin cancer excision (Right, 10/2011); and Carpal tunnel release (Right, 12/06/2015). FAMILY HISTORY Her family history includes Cancer in her paternal uncle; Diabetes in her paternal grandfather; Heart disease in her father and paternal grandfather; Hyperlipidemia in her father and mother; Hypertension in her father and mother; Rheum arthritis in her mother. SOCIAL HISTORY She  reports that she quit smoking about 29 years ago. She has never used smokeless  tobacco. She reports that she does not drink alcohol or use drugs.'  Review of Systems: Review of Systems  Constitutional: Negative for chills, fever and malaise/fatigue.  HENT: Negative for congestion, ear pain and sore throat.   Respiratory: Negative for cough, shortness of breath and wheezing.   Cardiovascular: Negative for chest pain, palpitations and leg swelling.  Gastrointestinal: Negative for abdominal pain, blood in stool, constipation, diarrhea, heartburn and melena.  Genitourinary: Negative.   Skin: Negative.   Neurological: Negative for dizziness, sensory change, loss of consciousness and headaches.  Psychiatric/Behavioral: Negative for depression. The patient is not nervous/anxious and does not have insomnia.     Physical Exam: Estimated body mass index is 27.07 kg/m as calculated from the following:   Height as of this encounter: 5\' 2"  (1.575 m).   Weight as of this encounter: 148 lb (67.1 kg). BP 118/74   Pulse 61   Temp 97.7 F (36.5 C)   Ht 5\' 2"  (1.575 m)   Wt 148 lb (67.1 kg)   SpO2 95%   BMI 27.07 kg/m   General Appearance: Well nourished well developed, in no apparent distress.  Eyes: PERRLA, EOMs, conjunctiva no swelling or  erythema ENT/Mouth: Ear canals normal without obstruction, swelling, erythema, or discharge.  TMs normal bilaterally with no erythema, bulging, retraction, or loss of landmark.  Oropharynx moist and clear with no exudate, erythema, or swelling.   Neck: Supple, thyroid normal. No bruits.  No cervical adenopathy Respiratory: Respiratory effort normal, Breath sounds clear A&P without wheeze, rhonchi, rales.   Cardio: RRR without murmurs, rubs or gallops. Brisk peripheral pulses without edema.  Chest: symmetric, with normal excursions Breasts: defer  Abdomen: Soft, nontender, no guarding, rebound, hernias, masses, or organomegaly.  Lymphatics: Non tender without lymphadenopathy.  Musculoskeletal: Full ROM all peripheral extremities,5/5 strength, and normal gait.  Skin: Warm, dry without rashes, lesions, ecchymosis. Neuro: Awake and oriented X 3, Cranial nerves intact, reflexes equal bilaterally. Normal muscle tone, no cerebellar symptoms. Sensation intact.  Psych:  normal affect, Insight and Judgment appropriate.   EKG: normal 07/2018- FAMILY HISTORY CAD IN 80'S  AORTA SCAN: defer  Over 40 minutes of exam, counseling, chart review and critical decision making was performed  Vicie Mutters 2:18 PM [facility]

## 2019-12-12 ENCOUNTER — Other Ambulatory Visit: Payer: Self-pay

## 2019-12-12 ENCOUNTER — Encounter: Payer: Self-pay | Admitting: Physician Assistant

## 2019-12-12 ENCOUNTER — Ambulatory Visit (INDEPENDENT_AMBULATORY_CARE_PROVIDER_SITE_OTHER): Payer: BC Managed Care – PPO | Admitting: Physician Assistant

## 2019-12-12 VITALS — BP 118/74 | HR 61 | Temp 97.7°F | Ht 62.0 in | Wt 148.0 lb

## 2019-12-12 DIAGNOSIS — Z1389 Encounter for screening for other disorder: Secondary | ICD-10-CM | POA: Diagnosis not present

## 2019-12-12 DIAGNOSIS — N951 Menopausal and female climacteric states: Secondary | ICD-10-CM

## 2019-12-12 DIAGNOSIS — R7309 Other abnormal glucose: Secondary | ICD-10-CM

## 2019-12-12 DIAGNOSIS — Z131 Encounter for screening for diabetes mellitus: Secondary | ICD-10-CM

## 2019-12-12 DIAGNOSIS — Z0001 Encounter for general adult medical examination with abnormal findings: Secondary | ICD-10-CM

## 2019-12-12 DIAGNOSIS — E559 Vitamin D deficiency, unspecified: Secondary | ICD-10-CM

## 2019-12-12 DIAGNOSIS — Z79899 Other long term (current) drug therapy: Secondary | ICD-10-CM | POA: Diagnosis not present

## 2019-12-12 DIAGNOSIS — Z1329 Encounter for screening for other suspected endocrine disorder: Secondary | ICD-10-CM | POA: Diagnosis not present

## 2019-12-12 DIAGNOSIS — Z13220 Encounter for screening for lipoid disorders: Secondary | ICD-10-CM

## 2019-12-12 DIAGNOSIS — Z0000 Encounter for general adult medical examination without abnormal findings: Secondary | ICD-10-CM

## 2019-12-12 DIAGNOSIS — F3341 Major depressive disorder, recurrent, in partial remission: Secondary | ICD-10-CM

## 2019-12-12 DIAGNOSIS — E782 Mixed hyperlipidemia: Secondary | ICD-10-CM

## 2019-12-12 DIAGNOSIS — K219 Gastro-esophageal reflux disease without esophagitis: Secondary | ICD-10-CM

## 2019-12-12 DIAGNOSIS — T7840XA Allergy, unspecified, initial encounter: Secondary | ICD-10-CM

## 2019-12-12 MED ORDER — OMEPRAZOLE 40 MG PO CPDR: DELAYED_RELEASE_CAPSULE | ORAL | 3 refills | Status: DC

## 2019-12-12 NOTE — Patient Instructions (Addendum)
Call Dr. Earlean Shawl to set up colonoscopy Phone: [phone] Suggest a colonoscopy, you are over due,  Please call the number  Colon cancer is 3rd most diagnosed cancer and 2nd leading cause of death in both men and women 63 years of age and older despite being one of the most preventable and treatable cancers if found early.  4 of out 5 people diagnosed with colon cancer have NO prior family history.  When caught EARLY 90% of colon cancer is curable.   Ask insurance and pharmacy about shingrix - new vaccine SUGGEST GETTING BEFORE AGE OF 24 SINCE YOU HAD THE OLD ZOSTAVAX   Can go to [email or web address] for more information  Shingrix Vaccination  Two vaccines are licensed and recommended to prevent shingles in the [place] Zoster vaccine live (ZVL, Zostavax) has been in use since 2006. Recombinant zoster vaccine (RZV, Shingrix), has been in use since 2017 and is recommended by AC[employer] as the preferred shingles vaccine.  What Everyone Should Know about Shingles Vaccine (Shingrix) One of the Recommended Vaccines by Disease Shingles vaccination is the only way to protect against shingles and postherpetic neuralgia (PHN), the most common complication from shingles. CDC recommends that healthy adults 50 years and older get two doses of the shingles vaccine called Shingrix (recombinant zoster vaccine), separated by 2 to 6 months, to prevent shingles and the complications from the disease. Your doctor or pharmacist can give you Shingrix as a shot in your upper arm. Shingrix provides strong protection against shingles and PHN. Two doses of Shingrix is more than 90% effective at preventing shingles and PHN. Protection stays above 85% for at least the first four years after you get vaccinated. Shingrix is the preferred vaccine, over Zostavax (zoster vaccine live), a shingles vaccine in use since 2006. Zostavax may still be used to prevent shingles in healthy adults  60 years and older. For example, you could use Zostavax if a person is allergic to Shingrix, prefers Zostavax, or requests immediate vaccination and Shingrix is unavailable. Who Should Get Shingrix? Healthy adults 50 years and older should get two doses of Shingrix, separated by 2 to 6 months. You should get Shingrix even if in the past you . had shingles  . received Zostavax  . are not sure if you had chickenpox There is no maximum age for getting Shingrix. If you had shingles in the past, you can get Shingrix to help prevent future occurrences of the disease. There is no specific length of time that you need to wait after having shingles before you can receive Shingrix, but generally you should make sure the shingles rash has gone away before getting vaccinated. You can get Shingrix whether or not you remember having had chickenpox in the past. Studies show that more than 99% of Americans 40 years and older have had chickenpox, even if they don't remember having the disease. Chickenpox and shingles are related because they are caused by the same virus (varicella zoster virus). After a person recovers from chickenpox, the virus stays dormant (inactive) in the body. It can reactivate years later and cause shingles. If you had Zostavax in the recent past, you should wait at least eight weeks before getting Shingrix. Talk to your healthcare provider to determine the best time to get Shingrix. Shingrix is available in [employer] and pharmacies. To find doctor's offices or pharmacies near you that offer the vaccine, visit HealthMap Vaccine FinderExternal. If you have questions about Shingrix, talk with your healthcare provider. Vaccine for Those 50  Years and Older  Shingrix reduces the risk of shingles and PHN by more than 90% in people 28 and older. CDC recommends the vaccine for healthy adults 67 and older.  Who Should Not Get Shingrix? You should not get Shingrix if you: . have ever had a  severe allergic reaction to any component of the vaccine or after a dose of Shingrix  . tested negative for immunity to varicella zoster virus. If you test negative, you should get chickenpox vaccine.  . currently have shingles  . currently are pregnant or breastfeeding. Women who are pregnant or breastfeeding should wait to get Shingrix.  Marland Kitchen receive specific antiviral drugs (acyclovir, famciclovir, or valacyclovir) 24 hours before vaccination (avoid use of these antiviral drugs for 14 days after vaccination)- zoster vaccine live only If you have a minor acute (starts suddenly) illness, such as a cold, you may get Shingrix. But if you have a moderate or severe acute illness, you should usually wait until you recover before getting the vaccine. This includes anyone with a temperature of 101.28F or higher. The side effects of the Shingrix are temporary, and usually last 2 to 3 days. While you may experience pain for a few days after getting Shingrix, the pain will be less severe than having shingles and the complications from the disease. How Well Does Shingrix Work? Two doses of Shingrix provides strong protection against shingles and postherpetic neuralgia (PHN), the most common complication of shingles. . In adults 84 to 63 years old who got two doses, Shingrix was 97% effective in preventing shingles; among adults 70 years and older, Shingrix was 91% effective.  . In adults 3 to 63 years old who got two doses, Shingrix was 91% effective in preventing PHN; among adults 70 years and older, Shingrix was 89% effective. Shingrix protection remained high (more than 85%) in people 70 years and older throughout the four years following vaccination. Since your risk of shingles and PHN increases as you get older, it is important to have strong protection against shingles in your older years. Top of Page  What Are the Possible Side Effects of Shingrix? Studies show that Shingrix is safe. The vaccine helps your  body create a strong defense against shingles. As a result, you are likely to have temporary side effects from getting the shots. The side effects may affect your ability to do normal daily activities for 2 to 3 days. Most people got a sore arm with mild or moderate pain after getting Shingrix, and some also had redness and swelling where they got the shot. Some people felt tired, had muscle pain, a headache, shivering, fever, stomach pain, or nausea. About 1 out of 6 people who got Shingrix experienced side effects that prevented them from doing regular activities. Symptoms went away on their own in about 2 to 3 days. Side effects were more common in younger people. You might have a reaction to the first or second dose of Shingrix, or both doses. If you experience side effects, you may choose to take over-the-counter pain medicine such as ibuprofen or acetaminophen. If you experience side effects from Shingrix, you should report them to the Vaccine Adverse Event Reporting System (VAERS). Your doctor might file this report, or you can do it yourself through the VAERS websiteExternal, or by calling [phone]. If you have any questions about side effects from Shingrix, talk with your doctor. The shingles vaccine does not contain thimerosal (a preservative containing mercury). Top of Page  When Should I  See a Doctor Because of the Side Effects I Experience From Shingrix? In clinical trials, Shingrix was not associated with serious adverse events. In fact, serious side effects from vaccines are extremely rare. For example, for every 1 million doses of a vaccine given, only one or two people may have a severe allergic reaction. Signs of an allergic reaction happen within minutes or hours after vaccination and include hives, swelling of the face and throat, difficulty breathing, a fast heartbeat, dizziness, or weakness. If you experience these or any other life-threatening symptoms, see a doctor right  away. Shingrix causes a strong response in your immune system, so it may produce short-term side effects more intense than you are used to from other vaccines. These side effects can be uncomfortable, but they are expected and usually go away on their own in 2 or 3 days. Top of Page  How Can I Pay For Shingrix? There are several ways shingles vaccine may be paid for: Medicare . Medicare Part D plans cover the shingles vaccine, but there may be a cost to you depending on your plan. There may be a copay for the vaccine, or you may need to pay in full then get reimbursed for a certain amount.  . Medicare Part B does not cover the shingles vaccine. Medicaid . Medicaid may or may not cover the vaccine. Contact your insurer to find out. Private health insurance . Many private health insurance plans will cover the vaccine, but there may be a cost to you depending on your plan. Contact your insurer to find out. Vaccine assistance programs . Some pharmaceutical companies provide vaccines to eligible adults who cannot afford them. You may want to check with the vaccine manufacturer, [employer], about Shingrix. If you do not currently have health insurance, learn more about affordable health coverage optionsExternal. To find doctor's offices or pharmacies near you that offer the vaccine, visit HealthMap Vaccine FinderExternal.

## 2019-12-13 LAB — URINALYSIS, ROUTINE W REFLEX MICROSCOPIC
Bilirubin Urine: NEGATIVE
Glucose, UA: NEGATIVE
Hgb urine dipstick: NEGATIVE
Ketones, ur: NEGATIVE
Leukocytes,Ua: NEGATIVE
Nitrite: NEGATIVE
Protein, ur: NEGATIVE
Specific Gravity, Urine: 1.004 (ref 1.001–1.03)
pH: 7 (ref 5.0–8.0)

## 2019-12-13 LAB — CBC WITH DIFFERENTIAL/PLATELET
Absolute Monocytes: 690 {cells}/uL (ref 200–950)
Basophils Absolute: 70 {cells}/uL (ref 0–200)
Basophils Relative: 0.7 %
Eosinophils Absolute: 120 {cells}/uL (ref 15–500)
Eosinophils Relative: 1.2 %
HCT: 39.1 % (ref 35.0–45.0)
Hemoglobin: 13.4 g/dL (ref 11.7–15.5)
Lymphs Abs: 3350 {cells}/uL (ref 850–3900)
MCH: 33.3 pg — ABNORMAL HIGH (ref 27.0–33.0)
MCHC: 34.3 g/dL (ref 32.0–36.0)
MCV: 97.3 fL (ref 80.0–100.0)
MPV: 11.2 fL (ref 7.5–12.5)
Monocytes Relative: 6.9 %
Neutro Abs: 5770 {cells}/uL (ref 1500–7800)
Neutrophils Relative %: 57.7 %
Platelets: 271 Thousand/uL (ref 140–400)
RBC: 4.02 Million/uL (ref 3.80–5.10)
RDW: 12.9 % (ref 11.0–15.0)
Total Lymphocyte: 33.5 %
WBC: 10 Thousand/uL (ref 3.8–10.8)

## 2019-12-13 LAB — COMPLETE METABOLIC PANEL WITH GFR
AG Ratio: 1.7 (calc) (ref 1.0–2.5)
ALT: 17 U/L (ref 6–29)
AST: 20 U/L (ref 10–35)
Albumin: 4.2 g/dL (ref 3.6–5.1)
Alkaline phosphatase (APISO): 34 U/L — ABNORMAL LOW (ref 37–153)
BUN: 10 mg/dL (ref 7–25)
CO2: 28 mmol/L (ref 20–32)
Calcium: 9.2 mg/dL (ref 8.6–10.4)
Chloride: 103 mmol/L (ref 98–110)
Creat: 0.78 mg/dL (ref 0.50–0.99)
GFR, Est African American: 94 mL/min/{1.73_m2} (ref >=60)
GFR, Est Non African American: 81 mL/min/{1.73_m2} (ref >=60)
Globulin: 2.5 g/dL (calc) (ref 1.9–3.7)
Glucose, Bld: 96 mg/dL (ref 65–99)
Potassium: 4.4 mmol/L (ref 3.5–5.3)
Sodium: 140 mmol/L (ref 135–146)
Total Bilirubin: 0.4 mg/dL (ref 0.2–1.2)
Total Protein: 6.7 g/dL (ref 6.1–8.1)

## 2019-12-13 LAB — LIPID PANEL
Cholesterol: 290 mg/dL — ABNORMAL HIGH (ref <200)
HDL: 57 mg/dL (ref >=50)
LDL Cholesterol (Calc): 182 mg/dL (calc) — ABNORMAL HIGH
Non-HDL Cholesterol (Calc): 233 mg/dL (calc) — ABNORMAL HIGH (ref <130)
Total CHOL/HDL Ratio: 5.1 (calc) — ABNORMAL HIGH (ref <5.0)
Triglycerides: 299 mg/dL — ABNORMAL HIGH (ref <150)

## 2019-12-13 LAB — MAGNESIUM: Magnesium: 2.1 mg/dL (ref 1.5–2.5)

## 2019-12-13 LAB — MICROALBUMIN / CREATININE URINE RATIO
Creatinine, Urine: 25 mg/dL (ref 20–275)
Microalb, Ur: <0.2 mg/dL

## 2019-12-13 LAB — VITAMIN D 25 HYDROXY (VIT D DEFICIENCY, FRACTURES): Vit D, 25-Hydroxy: 23 ng/mL — ABNORMAL LOW (ref 30–100)

## 2019-12-13 LAB — HEMOGLOBIN A1C
Hgb A1c MFr Bld: 5.5 %{Hb}
Mean Plasma Glucose: 111 (calc)
eAG (mmol/L): 6.2 (calc)

## 2019-12-13 LAB — TSH: TSH: 1.38 m[IU]/L (ref 0.40–4.50)

## 2020-02-08 ENCOUNTER — Encounter: Payer: Self-pay | Admitting: *Deleted

## 2020-02-23 ENCOUNTER — Ambulatory Visit (INDEPENDENT_AMBULATORY_CARE_PROVIDER_SITE_OTHER): Payer: BC Managed Care – PPO | Admitting: Physician Assistant

## 2020-02-23 ENCOUNTER — Other Ambulatory Visit: Payer: Self-pay

## 2020-02-23 ENCOUNTER — Encounter: Payer: Self-pay | Admitting: Physician Assistant

## 2020-02-23 DIAGNOSIS — Z85828 Personal history of other malignant neoplasm of skin: Secondary | ICD-10-CM | POA: Diagnosis not present

## 2020-02-23 DIAGNOSIS — Z1283 Encounter for screening for malignant neoplasm of skin: Secondary | ICD-10-CM

## 2020-02-23 DIAGNOSIS — Z86018 Personal history of other benign neoplasm: Secondary | ICD-10-CM | POA: Diagnosis not present

## 2020-02-23 DIAGNOSIS — L820 Inflamed seborrheic keratosis: Secondary | ICD-10-CM

## 2020-02-23 NOTE — Patient Instructions (Signed)

## 2020-02-23 NOTE — Progress Notes (Signed)
   Follow-Up Visit   Subjective  Norma Cameron is a 64 y.o. female who presents for the following: Annual Exam (wants moles checked. last visit was 08/24/2018). Has an irritated crust on her left outer breast that itches and bothers her. History of DN and BCC.  The following portions of the chart were reviewed this encounter and updated as appropriate: Tobacco  Allergies  Meds  Problems  Med Hx  Surg Hx  Fam Hx      Review of Systems: No other skin or systemic complaints.  Objective  Well appearing patient in no apparent distress; mood and affect are within normal limits.  A full examination was performed including scalp, head, eyes, ears, nose, lips, neck, chest, axillae, abdomen, back, buttocks, bilateral upper extremities, bilateral lower extremities, hands, feet, fingers, toes, fingernails, and toenails. All findings within normal limits unless otherwise noted below.  Objective  Left Breast: Erythematous stuck-on, waxy papule or plaque.    Assessment & Plan  Seborrheic keratosis, inflamed Left Breast  Epidermal / dermal shaving - Left Breast  Informed consent: discussed and consent obtained   Timeout: patient name, date of birth, surgical site, and procedure verified   Procedure prep:  Patient was prepped and draped in usual sterile fashion Prep type:  Chlorhexidine Anesthesia: the lesion was anesthetized in a standard fashion   Anesthetic:  1% lidocaine w/ epinephrine 1-100,000 local infiltration Instrument used: flexible razor blade   Outcome: patient tolerated procedure well   Post-procedure details: wound care instructions given

## 2020-02-24 ENCOUNTER — Encounter: Payer: Self-pay | Admitting: Physician Assistant

## 2020-03-12 NOTE — Progress Notes (Signed)
Follow up  Assessment and Plan:  Mixed hyperlipidemia check lipids and lipoprotein A Will get cardiac calcium score due to resistance to meds/unable to tolerate statins and family history to stratify risk decrease fatty foods, continue diet increase activity.  -     TSH -     Lipid Profile  Recurrent major depressive disorder, in partial remission (HCC) Continue lexapro 10 mg  Vitamin D deficiency -     Vitamin D (25 hydroxy)  Medication management -     CBC with Diff -     COMPLETE METABOLIC PANEL WITH GFR -     Magnesium  Allergy, initial encounter Continue OTC meds Sent in decadron due to severity    Discussed med's effects and SE's. Screening labs and tests as requested with regular follow-up as recommended.  HPI  64 y.o. female  presents for follow up for chol, vitamin D def.   Her blood pressure has been controlled at home, today their BP is BP: 124/80.  She does workout, since the pandemic she is not attending classes but she is walking/riding bikes. She denies chest pain, shortness of breath, dizziness.  BMI is Body mass index is 26.59 kg/m., she is working on diet and exercise. Wt Readings from Last 3 Encounters:  03/14/20 145 lb 6.4 oz (66 kg)  12/12/19 148 lb (67.1 kg)  10/06/19 147 lb (66.7 kg)   She works with [employer], does everything over the phone in the office.   She just started HRT with Dr. Nori Riis 2 weeks ago, she is still on lexapro only on 10mg  a day. She takes xanax 1/2 very rarely per patient.  Had DEXA 11/2019, MGM 03/2019, PAP 07/2019 per patient- normal.   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 Her cholesterol is not at goal. The cholesterol last visit was:  Lab Results  Component Value Date   CHOL 290 (H) 12/12/2019   HDL 57 12/12/2019   LDLCALC 182 (H) 12/12/2019   TRIG 299 (H) 12/12/2019   CHOLHDL 5.1 (H) 12/12/2019  . She has been working on  diet and exercise for prediabetes, she is on bASA, she is not on ACE/ARB and denies foot ulcerations, hyperglycemia, hypoglycemia , increased appetite, nausea, paresthesia of the feet, polydipsia, polyuria, visual disturbances, vomiting and weight loss. Last A1C in the office was:  Lab Results  Component Value Date   HGBA1C 5.5 12/12/2019   Patient is on Vitamin D supplement, vitamin D/calcium combo and vitamin D increased to 4000 IU from 2000IU.    Lab Results  Component Value Date   VD25OH 23 (L) 12/12/2019     Immunization History  Administered Date(s) Administered  . Janssen (J&J) SARS-COV-2 Vaccination 02/17/2020  . Tdap 02/04/2008, 08/03/2018  . Zoster 12/28/2012    Current Medications:  Current Outpatient Medications on File Prior to Visit  Medication Sig Dispense Refill  . ALPRAZolam (XANAX) 0.5 MG tablet Take 0.5 mg by mouth 2 (two) times daily.    . calcium citrate-vitamin D 500-400 MG-UNIT chewable tablet Chew by mouth daily.    . cetirizine (ZYRTEC) 10 MG chewable tablet Chew 10 mg by mouth daily.    Marland Kitchen escitalopram (LEXAPRO) 20 MG tablet Take 20 mg by mouth daily.    Marland Kitchen estradiol (ESTRACE) 2 MG tablet Take 2 mg by mouth daily.    . Magnesium 400 MG TABS Take 400 mg by mouth daily.    Marland Kitchen  meloxicam (MOBIC) 15 MG tablet Take 1 tablet (15 mg total) by mouth daily. 90 tablet 0  . Probiotic Product (PROBIOTIC DAILY PO) Take by mouth daily.    . progesterone (PROMETRIUM) 100 MG capsule Take 100 mg by mouth daily.    Marland Kitchen zolpidem (AMBIEN) 10 MG tablet Take 10 mg by mouth at bedtime as needed for sleep.     No current facility-administered medications on file prior to visit.   Medical History:  Past Medical History:  Diagnosis Date  . Allergy   . 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 BCC (basal cell carcinoma of skin) 10/07/2011   Right Inner Shin  . Depression    CONTROLLED WITH OCCASIONAL XANAX  . Fibrocystic breast disease   .  Hyperlipidemia   . Osteopenia   . Prediabetes 09/05/2014  . Vitamin D deficiency    Allergies Allergies  Allergen Reactions  . Acyclovir And Related   . Eggs Or Egg-Derived Products Hives    Patient states she eats eggs with no problem.  Received propofol with no reaction.   . Neosporin [Neomycin-Bacitracin Zn-Polymyx] Hives  . Red Yeast Rice [Cholestin] Other (See Comments)    MYALGIA with higher doses  . Sulfa Antibiotics Hives  . Zostavax [Zoster Vaccine Live] Rash    SURGICAL HISTORY She  has a past surgical history that includes Dilation and curettage of uterus (2006); Novasure ablation (2006); Cervix lesion destruction; Skin cancer excision (Right, 10/2011); and Carpal tunnel release (Right, 12/06/2015). FAMILY HISTORY Her family history includes Cancer in her paternal uncle; Diabetes in her paternal grandfather; Heart disease in her father and paternal grandfather; Hyperlipidemia in her father and mother; Hypertension in her father and mother; Rheum arthritis in her mother. SOCIAL HISTORY She  reports that she quit smoking about 30 years ago. She has never used smokeless tobacco. She reports that she does not drink alcohol or use drugs.'  Review of Systems: Review of Systems  Constitutional: Negative for chills, fever and malaise/fatigue.  HENT: Negative for congestion, ear pain and sore throat.   Respiratory: Negative for cough, shortness of breath and wheezing.   Cardiovascular: Negative for chest pain, palpitations and leg swelling.  Gastrointestinal: Negative for abdominal pain, blood in stool, constipation, diarrhea, heartburn and melena.  Genitourinary: Negative.   Skin: Negative.   Neurological: Negative for dizziness, sensory change, loss of consciousness and headaches.  Psychiatric/Behavioral: Negative for depression. The patient is not nervous/anxious and does not have insomnia.     Physical Exam: Estimated body mass index is 26.59 kg/m as calculated from the  following:   Height as of 12/12/19: 5\' 2"  (1.575 m).   Weight as of this encounter: 145 lb 6.4 oz (66 kg). BP 124/80   Pulse 75   Temp (!) 97.5 F (36.4 C)   Wt 145 lb 6.4 oz (66 kg)   SpO2 98%   BMI 26.59 kg/m   General Appearance: Well nourished well developed, in no apparent distress.  Eyes: PERRLA, EOMs, conjunctiva no swelling or erythema ENT/Mouth: Ear canals normal without obstruction, swelling, erythema, or discharge.  TMs normal bilaterally with no erythema, bulging, retraction, or loss of landmark.  Oropharynx moist and clear with no exudate, erythema, or swelling.   Neck: Supple, thyroid normal. No bruits.  No cervical adenopathy Respiratory: Respiratory effort normal, Breath sounds clear A&P without wheeze, rhonchi, rales.   Cardio: RRR without murmurs, rubs or gallops. Brisk peripheral pulses without edema.  Chest: symmetric, with  normal excursions Breasts: defer  Abdomen: Soft, nontender, no guarding, rebound, hernias, masses, or organomegaly.  Lymphatics: Non tender without lymphadenopathy.  Musculoskeletal: Full ROM all peripheral extremities,5/5 strength, and normal gait.  Skin: Warm, dry without rashes, lesions, ecchymosis. Neuro: Awake and oriented X 3, Cranial nerves intact, reflexes equal bilaterally. Normal muscle tone, no cerebellar symptoms. Sensation intact.  Psych:  normal affect, Insight and Judgment appropriate.    Vicie Mutters 3:58 PM [facility]

## 2020-03-14 ENCOUNTER — Encounter: Payer: Self-pay | Admitting: Physician Assistant

## 2020-03-14 ENCOUNTER — Other Ambulatory Visit: Payer: Self-pay

## 2020-03-14 ENCOUNTER — Ambulatory Visit (INDEPENDENT_AMBULATORY_CARE_PROVIDER_SITE_OTHER): Payer: BC Managed Care – PPO | Admitting: Physician Assistant

## 2020-03-14 VITALS — BP 124/80 | HR 75 | Temp 97.5°F | Wt 145.4 lb

## 2020-03-14 DIAGNOSIS — Z79899 Other long term (current) drug therapy: Secondary | ICD-10-CM | POA: Diagnosis not present

## 2020-03-14 DIAGNOSIS — E782 Mixed hyperlipidemia: Secondary | ICD-10-CM | POA: Diagnosis not present

## 2020-03-14 DIAGNOSIS — F3341 Major depressive disorder, recurrent, in partial remission: Secondary | ICD-10-CM

## 2020-03-14 DIAGNOSIS — E559 Vitamin D deficiency, unspecified: Secondary | ICD-10-CM | POA: Diagnosis not present

## 2020-03-14 DIAGNOSIS — R7309 Other abnormal glucose: Secondary | ICD-10-CM

## 2020-03-14 MED ORDER — DEXAMETHASONE 1 MG PO TABS: ORAL_TABLET | ORAL | 1 refills | Status: DC

## 2020-03-14 NOTE — Patient Instructions (Addendum)
Your LDL could improve, ideally we want it under a 100.  Your LDL is the bad cholesterol that can lead to heart attack and stroke. To lower your number you can decrease your fatty foods, red meat, cheese, milk and increase fiber like whole grains and veggies. You can also add a fiber supplement like Citracel or Benefiber, these do not cause gas and bloating and are safe to use. Especially if you have a strong family history of heart disease or stroke or you have evidence of plaque on any imaging like a chest xray, we may discuss at your next office visit putting you on a medication to get your number below 100.   I'm going to place an order for a cardiac calcium score, this will help [place] quantify how much calcification if any you have in your coronary arteries and can help [place] decide if we need to do aggressive medical management like cholesterol medication.  It will be done at [facility] on [address] in [place] It will be 99 dollars self pay.  You can call [phone] to schedule this, just given them your name and date of birth.   Take flonase 2 x a day  Can take one of the dexamethasone 1 pill daily for 1-3 days.   HOW TO TREAT VIRAL COUGH AND COLD SYMPTOMS:  -Symptoms usually last at least 1 week with the worst symptoms being around day 4.  - colds usually start with a sore throat and end with a cough, and the cough can take 2 weeks to get better.  -No antibiotics are needed for colds, flu, sore throats, cough, bronchitis UNLESS symptoms are longer than 7 days OR if you are getting better then get drastically worse.  -There are a lot of combination medications (Dayquil, Nyquil, Vicks 44, tyelnol cold and sinus, ETC). Please look at the ingredients on the back so that you are treating the correct symptoms and not doubling up on medications/ingredients.    Medicines you can use  Nasal congestion  Little Remedies saline spray (aerosol/mist)- can try this, it is in the kids section -  pseudoephedrine (Sudafed)- behind the counter, do not use if you have high blood pressure, medicine that have -D in them.  - phenylephrine (Sudafed PE) -Dextormethorphan + chlorpheniramine (Coridcidin HBP)- okay if you have high blood pressure -Oxymetazoline (Afrin) nasal spray- LIMIT to 3 days -Saline nasal spray -Neti pot (used distilled or bottled water)  Ear pain/congestion  -pseudoephedrine (sudafed) - Nasonex/flonase nasal spray  Fever  -Acetaminophen (Tyelnol) -Ibuprofen (Advil, motrin, aleve)  Sore Throat  -Acetaminophen (Tyelnol) -Ibuprofen (Advil, motrin, aleve) -Drink a lot of water -Gargle with salt water - Rest your voice (don't talk) -Throat sprays -Cough drops  Body Aches  -Acetaminophen (Tyelnol) -Ibuprofen (Advil, motrin, aleve)  Headache  -Acetaminophen (Tyelnol) -Ibuprofen (Advil, motrin, aleve) - Exedrin, Exedrin Migraine  Allergy symptoms (cough, sneeze, runny nose, itchy eyes) -Claritin or loratadine cheapest but likely the weakest  -Zyrtec or certizine at night because it can make you sleepy -The strongest is allegra or fexafinadine  Cheapest at [employer], [employer], [employer]  Cough  -Dextromethorphan (Delsym)- medicine that has DM in it -Guafenesin (Mucinex/Robitussin) - cough drops - drink lots of water  Chest Congestion  -Guafenesin (Mucinex/Robitussin)  Red Itchy Eyes  - Naphcon-A  Upset Stomach  - Bland diet (nothing spicy, greasy, fried, and high acid foods like tomatoes, oranges, berries) -OKAY- cereal, bread, soup, crackers, rice -Eat smaller more frequent meals -reduce caffeine, no alcohol -Loperamide (  Imodium-AD) if diarrhea -Prevacid for heart burn  General health when sick  -Hydration -wash your hands frequently -keep surfaces clean -change pillow cases and sheets often -Get fresh air but do not exercise strenuously -Vitamin D, double up on it - Vitamin C -Zinc

## 2020-03-20 LAB — CBC WITH DIFFERENTIAL/PLATELET
Absolute Monocytes: 418 cells/uL (ref 200–950)
Basophils Absolute: 61 cells/uL (ref 0–200)
Basophils Relative: 0.7 %
Eosinophils Absolute: 113 cells/uL (ref 15–500)
Eosinophils Relative: 1.3 %
HCT: 38.3 % (ref 35.0–45.0)
Hemoglobin: 13.1 g/dL (ref 11.7–15.5)
Lymphs Abs: 3210 cells/uL (ref 850–3900)
MCH: 33.8 pg — ABNORMAL HIGH (ref 27.0–33.0)
MCHC: 34.2 g/dL (ref 32.0–36.0)
MCV: 98.7 fL (ref 80.0–100.0)
MPV: 11.3 fL (ref 7.5–12.5)
Monocytes Relative: 4.8 %
Neutro Abs: 4898 cells/uL (ref 1500–7800)
Neutrophils Relative %: 56.3 %
Platelets: 279 10*3/uL (ref 140–400)
RBC: 3.88 10*6/uL (ref 3.80–5.10)
RDW: 12.7 % (ref 11.0–15.0)
Total Lymphocyte: 36.9 %
WBC: 8.7 10*3/uL (ref 3.8–10.8)

## 2020-03-20 LAB — COMPLETE METABOLIC PANEL WITH GFR
AG Ratio: 1.6 (calc) (ref 1.0–2.5)
ALT: 18 U/L (ref 6–29)
AST: 19 U/L (ref 10–35)
Albumin: 4.2 g/dL (ref 3.6–5.1)
Alkaline phosphatase (APISO): 31 U/L — ABNORMAL LOW (ref 37–153)
BUN: 12 mg/dL (ref 7–25)
CO2: 27 mmol/L (ref 20–32)
Calcium: 9.7 mg/dL (ref 8.6–10.4)
Chloride: 102 mmol/L (ref 98–110)
Creat: 0.94 mg/dL (ref 0.50–0.99)
GFR, Est African American: 75 mL/min/{1.73_m2} (ref >=60)
GFR, Est Non African American: 65 mL/min/{1.73_m2} (ref >=60)
Globulin: 2.6 g/dL (calc) (ref 1.9–3.7)
Glucose, Bld: 85 mg/dL (ref 65–99)
Potassium: 4.4 mmol/L (ref 3.5–5.3)
Sodium: 138 mmol/L (ref 135–146)
Total Bilirubin: 0.4 mg/dL (ref 0.2–1.2)
Total Protein: 6.8 g/dL (ref 6.1–8.1)

## 2020-03-20 LAB — LIPID PANEL
Cholesterol: 271 mg/dL — ABNORMAL HIGH (ref <200)
HDL: 59 mg/dL (ref >=50)
LDL Cholesterol (Calc): 160 mg/dL (calc) — ABNORMAL HIGH
Non-HDL Cholesterol (Calc): 212 mg/dL (calc) — ABNORMAL HIGH (ref <130)
Total CHOL/HDL Ratio: 4.6 (calc) (ref <5.0)
Triglycerides: 340 mg/dL — ABNORMAL HIGH (ref <150)

## 2020-03-20 LAB — MAGNESIUM: Magnesium: 2 mg/dL (ref 1.5–2.5)

## 2020-03-20 LAB — TSH: TSH: 1.41 mIU/L (ref 0.40–4.50)

## 2020-03-20 LAB — LIPOPROTEIN A (LPA): Lipoprotein (a): 146 nmol/L — ABNORMAL HIGH (ref <75)

## 2020-04-02 DIAGNOSIS — Z1231 Encounter for screening mammogram for malignant neoplasm of breast: Secondary | ICD-10-CM | POA: Diagnosis not present

## 2020-04-12 ENCOUNTER — Encounter: Payer: Self-pay | Admitting: Physician Assistant

## 2020-04-12 DIAGNOSIS — R931 Abnormal findings on diagnostic imaging of heart and coronary circulation: Secondary | ICD-10-CM | POA: Insufficient documentation

## 2020-04-13 ENCOUNTER — Other Ambulatory Visit: Payer: Self-pay | Admitting: Internal Medicine

## 2020-04-26 ENCOUNTER — Telehealth: Payer: Self-pay | Admitting: Physician Assistant

## 2020-04-26 MED ORDER — AZITHROMYCIN 250 MG PO TABS: ORAL_TABLET | ORAL | 1 refills | Status: AC

## 2020-04-26 NOTE — Telephone Encounter (Signed)
-----   Message from Elenor Quinones, [place] sent at 04/26/2020 11:12 AM EDT ----- Regarding: MED REQUEST Contact: [phone] PER patient has a sinus infection. Wants to know if you will call in Rx for her.   [facility] [address]  Please & thank you

## 2020-06-28 ENCOUNTER — Encounter: Payer: Self-pay | Admitting: Internal Medicine

## 2020-07-20 DIAGNOSIS — M8580 Other specified disorders of bone density and structure, unspecified site: Secondary | ICD-10-CM | POA: Insufficient documentation

## 2020-07-23 DIAGNOSIS — Z01419 Encounter for gynecological examination (general) (routine) without abnormal findings: Secondary | ICD-10-CM | POA: Diagnosis not present

## 2020-07-23 DIAGNOSIS — Z6827 Body mass index (BMI) 27.0-27.9, adult: Secondary | ICD-10-CM | POA: Diagnosis not present

## 2020-07-23 DIAGNOSIS — N760 Acute vaginitis: Secondary | ICD-10-CM | POA: Diagnosis not present

## 2020-08-09 DIAGNOSIS — N393 Stress incontinence (female) (male): Secondary | ICD-10-CM | POA: Diagnosis not present

## 2020-08-21 DIAGNOSIS — N393 Stress incontinence (female) (male): Secondary | ICD-10-CM | POA: Diagnosis not present

## 2020-09-03 DIAGNOSIS — N393 Stress incontinence (female) (male): Secondary | ICD-10-CM | POA: Diagnosis not present

## 2020-09-05 ENCOUNTER — Encounter (HOSPITAL_BASED_OUTPATIENT_CLINIC_OR_DEPARTMENT_OTHER): Payer: Self-pay | Admitting: Obstetrics and Gynecology

## 2020-09-05 ENCOUNTER — Other Ambulatory Visit: Payer: Self-pay

## 2020-09-05 NOTE — Progress Notes (Signed)
Spoke w/ via phone for pre-op interview  Lab needs dos----               Lab results------ COVID test  09/07/20- 1200 Arrive at 0530 NPO after MN NO Solid Food.  Clear liquids from MN until 0430 Medications to take morning of surgery ZYRTEC, OMEPRAZOLE, LEXAPRO  Diabetic medication ----- Patient Special Instructions ----- Pre-Op special Istructions HIBICLENS SHOWER X 2 Patient verbalized understanding of instructions that were given at this phone interview. Patient denies shortness of breath, chest pain, fever, cough at this phone interview.

## 2020-09-05 NOTE — Progress Notes (Signed)
Need cbc and t & s day of surgery

## 2020-09-07 ENCOUNTER — Other Ambulatory Visit (HOSPITAL_COMMUNITY)
Admission: RE | Admit: 2020-09-07 | Discharge: 2020-09-07 | Disposition: A | Discharge status: Home / Self Care | Payer: BC Managed Care – PPO | Source: Ambulatory Visit | Attending: Obstetrics and Gynecology | Admitting: Obstetrics and Gynecology

## 2020-09-07 DIAGNOSIS — Z01812 Encounter for preprocedural laboratory examination: Secondary | ICD-10-CM | POA: Insufficient documentation

## 2020-09-07 DIAGNOSIS — Z20822 Contact with and (suspected) exposure to covid-19: Secondary | ICD-10-CM | POA: Insufficient documentation

## 2020-09-07 LAB — SARS CORONAVIRUS 2 (TAT 6-24 HRS): SARS Coronavirus 2: NEGATIVE

## 2020-09-07 NOTE — H&P (Signed)
Norma Cameron is an 64 y.o. female. She has stress urinary incontinence by history and office urodynamics.   Pertinent Gynecological History: Menses: post-menopausal Bleeding:  Contraception: none DES exposure: denies Blood transfusions: none Sexually transmitted diseases: no past history Previous GYN Procedures: DNC and EMA  Last mammogram: normal Date: 2021 Last pap: normal Date: 2021 OB History: G1, P1   Menstrual History: Menarche age: unknown No LMP recorded. Patient has had an ablation.    Past Medical History:  Diagnosis Date  . Allergy   . Anxiety   . 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 BCC (basal cell carcinoma of skin) 10/07/2011   Right Inner Shin  . Depression    CONTROLLED WITH OCCASIONAL XANAX  . Fibrocystic breast disease   . GERD (gastroesophageal reflux disease)   . Hyperlipidemia   . Osteopenia   . Prediabetes 09/05/2014  . Vitamin D deficiency     Past Surgical History:  Procedure Laterality Date  . CARPAL TUNNEL RELEASE Right 12/06/2015   Procedure: RIGHT CARPAL TUNNEL RELEASE;  Surgeon: Leanora Cover, MD;  Location: [place];  Service: Orthopedics;  Laterality: Right;  . CERVIX LESION DESTRUCTION     EARLY 20'S  . DILATION AND CURETTAGE OF UTERUS  2006  . HERNIA REPAIR     AGE 41  . [place]  2006  . SKIN CANCER EXCISION Right 10/2011   LOWER EXT BCC    Family History  Problem Relation Age of Onset  . Hypertension Mother   . Hyperlipidemia Mother   . Rheum arthritis Mother   . Heart disease Father        CABG  . Hyperlipidemia Father   . Hypertension Father   . Cancer Paternal Uncle        THROAT  . Heart disease Paternal Grandfather   . Diabetes Paternal Grandfather     Social History:  reports that she quit smoking about 30 years ago. She has never used smokeless tobacco. She reports current alcohol use. She reports that she does not use drugs.  Allergies:  Allergies   Allergen Reactions  . Acyclovir And Related   . Eggs Or Egg-Derived Products Hives    Patient states she eats eggs with no problem.  Received propofol with no reaction.   . Neosporin [Neomycin-Bacitracin Zn-Polymyx] Hives  . Red Yeast Rice [Cholestin] Other (See Comments)    MYALGIA with higher doses  . Sulfa Antibiotics Hives  . Zostavax [Zoster Vaccine Live] Rash    No medications prior to admission.    Review of Systems  Constitutional: Negative for fever.    There were no vitals taken for this visit. Physical Exam Cardiovascular:     Rate and Rhythm: Normal rate.  Pulmonary:     Effort: Pulmonary effort is normal.     No results found for this or any previous visit (from the past 24 hour(s)).  No results found.  Assessment/Plan: 64 yo with SGUI TOT-D/W patient procedure and risks including infection, organ damage, bleeding/transfusion-HIV/Hep, DVT/PE, pneumonia, urinary retention, prolonged post operative catheterization, return to OR, recurrent or persistent SUI, erosion of mesh into urethra/bladder/vagina, dyspareunia. She states she understands and agrees.  Shon Millet II 09/07/2020, 2:01 PM

## 2020-09-09 NOTE — Anesthesia Preprocedure Evaluation (Addendum)
Anesthesia Evaluation  Patient identified by MRN, date of birth, ID band Patient awake    Reviewed: Allergy & Precautions, NPO status , Patient's Chart, lab work & pertinent test results  Airway Mallampati: II  TM Distance: >3 FB Neck ROM: Full    Dental no notable dental hx. (+) Teeth Intact, Dental Advisory Given   Pulmonary neg pulmonary ROS, former smoker,    Pulmonary exam normal breath sounds clear to auscultation       Cardiovascular Exercise Tolerance: Good negative cardio ROS Normal cardiovascular exam Rhythm:Regular Rate:Normal     Neuro/Psych Anxiety negative neurological ROS     GI/Hepatic Neg liver ROS, GERD  Medicated,  Endo/Other  negative endocrine ROS  Renal/GU negative Renal ROS     Musculoskeletal negative musculoskeletal ROS (+)   Abdominal   Peds negative pediatric ROS (+)  Hematology negative hematology ROS (+)   Anesthesia Other Findings All: see list  Reproductive/Obstetrics                           Anesthesia Physical Anesthesia Plan  ASA: II  Anesthesia Plan: General   Post-op Pain Management:    Induction: Intravenous  PONV Risk Score and Plan: 4 or greater and Treatment may vary due to age or medical condition, Ondansetron, Dexamethasone and Midazolam  Airway Management Planned: LMA  Additional Equipment: None  Intra-op Plan:   Post-operative Plan:   Informed Consent: I have reviewed the patients History and Physical, chart, labs and discussed the procedure including the risks, benefits and alternatives for the proposed anesthesia with the patient or authorized representative who has indicated his/her understanding and acceptance.     Dental advisory given  Plan Discussed with: CRNA  Anesthesia Plan Comments:       Anesthesia Quick Evaluation

## 2020-09-10 ENCOUNTER — Encounter (HOSPITAL_BASED_OUTPATIENT_CLINIC_OR_DEPARTMENT_OTHER): Payer: Self-pay | Admitting: Obstetrics and Gynecology

## 2020-09-10 ENCOUNTER — Ambulatory Visit (HOSPITAL_BASED_OUTPATIENT_CLINIC_OR_DEPARTMENT_OTHER): Payer: BC Managed Care – PPO | Admitting: Anesthesiology

## 2020-09-10 ENCOUNTER — Other Ambulatory Visit: Payer: Self-pay

## 2020-09-10 ENCOUNTER — Ambulatory Visit (HOSPITAL_BASED_OUTPATIENT_CLINIC_OR_DEPARTMENT_OTHER)
Admission: RE | Admit: 2020-09-10 | Discharge: 2020-09-10 | Disposition: A | Discharge status: Home / Self Care | Payer: BC Managed Care – PPO | Attending: Obstetrics and Gynecology | Admitting: Obstetrics and Gynecology

## 2020-09-10 ENCOUNTER — Encounter (HOSPITAL_BASED_OUTPATIENT_CLINIC_OR_DEPARTMENT_OTHER)
Admission: RE | Disposition: A | Discharge status: Home / Self Care | Payer: Self-pay | Source: Home / Self Care | Attending: Obstetrics and Gynecology

## 2020-09-10 DIAGNOSIS — Z85828 Personal history of other malignant neoplasm of skin: Secondary | ICD-10-CM | POA: Insufficient documentation

## 2020-09-10 DIAGNOSIS — E785 Hyperlipidemia, unspecified: Secondary | ICD-10-CM | POA: Diagnosis not present

## 2020-09-10 DIAGNOSIS — N393 Stress incontinence (female) (male): Secondary | ICD-10-CM | POA: Insufficient documentation

## 2020-09-10 DIAGNOSIS — F418 Other specified anxiety disorders: Secondary | ICD-10-CM | POA: Diagnosis not present

## 2020-09-10 DIAGNOSIS — E559 Vitamin D deficiency, unspecified: Secondary | ICD-10-CM | POA: Diagnosis not present

## 2020-09-10 DIAGNOSIS — Z87891 Personal history of nicotine dependence: Secondary | ICD-10-CM | POA: Insufficient documentation

## 2020-09-10 HISTORY — PX: BLADDER SUSPENSION: SHX72

## 2020-09-10 HISTORY — DX: Anxiety disorder, unspecified: F41.9

## 2020-09-10 HISTORY — PX: CYSTOSCOPY: SHX5120

## 2020-09-10 HISTORY — DX: Gastro-esophageal reflux disease without esophagitis: K21.9

## 2020-09-10 LAB — CBC
HCT: 41.6 % (ref 36.0–46.0)
Hemoglobin: 13.8 g/dL (ref 12.0–15.0)
MCH: 33.7 pg (ref 26.0–34.0)
MCHC: 33.2 g/dL (ref 30.0–36.0)
MCV: 101.5 fL — ABNORMAL HIGH (ref 80.0–100.0)
Platelets: 283 10*3/uL (ref 150–400)
RBC: 4.1 MIL/uL (ref 3.87–5.11)
RDW: 13.7 % (ref 11.5–15.5)
WBC: 7.9 10*3/uL (ref 4.0–10.5)
nRBC: 0 % (ref 0.0–0.2)

## 2020-09-10 LAB — TYPE AND SCREEN
ABO/RH(D): O POS
Antibody Screen: NEGATIVE

## 2020-09-10 LAB — ABO/RH: ABO/RH(D): O POS

## 2020-09-10 SURGERY — URETHROPEXY, USING TRANSVAGINAL TAPE
Anesthesia: General | Site: Vagina

## 2020-09-10 MED ORDER — ACETAMINOPHEN 10 MG/ML IV SOLN: 1000.0000 mg | Freq: Once | INTRAVENOUS | Status: DC | PRN

## 2020-09-10 MED ORDER — LIDOCAINE 2% (20 MG/ML) 5 ML SYRINGE
INTRAMUSCULAR | Status: AC
  Filled 2020-09-10: qty 5

## 2020-09-10 MED ORDER — FENTANYL CITRATE (PF) 250 MCG/5ML IJ SOLN
INTRAMUSCULAR | Status: AC
  Filled 2020-09-10: qty 5

## 2020-09-10 MED ORDER — ONDANSETRON HCL 4 MG/2ML IJ SOLN
INTRAMUSCULAR | Status: AC
  Filled 2020-09-10: qty 2

## 2020-09-10 MED ORDER — MIDAZOLAM HCL 2 MG/2ML IJ SOLN
INTRAMUSCULAR | Status: AC
  Filled 2020-09-10: qty 2

## 2020-09-10 MED ORDER — PROPOFOL 10 MG/ML IV BOLUS
INTRAVENOUS | Status: AC
  Filled 2020-09-10: qty 20

## 2020-09-10 MED ORDER — CEFAZOLIN SODIUM-DEXTROSE 2-4 GM/100ML-% IV SOLN
INTRAVENOUS | Status: AC
  Filled 2020-09-10: qty 100

## 2020-09-10 MED ORDER — FENTANYL CITRATE (PF) 100 MCG/2ML IJ SOLN
INTRAMUSCULAR | Status: DC | PRN
Start: 2020-09-10 — End: 2020-09-10
  Administered 2020-09-10 (×2): 50 ug via INTRAVENOUS

## 2020-09-10 MED ORDER — DEXMEDETOMIDINE (PRECEDEX) IN NS 20 MCG/5ML (4 MCG/ML) IV SYRINGE
PREFILLED_SYRINGE | INTRAVENOUS | Status: AC
  Filled 2020-09-10: qty 5

## 2020-09-10 MED ORDER — DEXAMETHASONE SODIUM PHOSPHATE 10 MG/ML IJ SOLN
INTRAMUSCULAR | Status: DC | PRN
  Administered 2020-09-10: 10 mg via INTRAVENOUS

## 2020-09-10 MED ORDER — LIDOCAINE-EPINEPHRINE 0.5 %-1:200000 IJ SOLN
INTRAMUSCULAR | Status: DC | PRN
  Administered 2020-09-10: 16 mL

## 2020-09-10 MED ORDER — OXYCODONE HCL 5 MG PO TABS: 5.0000 mg | ORAL_TABLET | Freq: Four times a day (QID) | ORAL | 0 refills | Status: DC | PRN

## 2020-09-10 MED ORDER — LACTATED RINGERS IV SOLN: INTRAVENOUS | Status: DC

## 2020-09-10 MED ORDER — OXYCODONE HCL 5 MG PO TABS: 5.0000 mg | ORAL_TABLET | Freq: Once | ORAL | Status: DC | PRN

## 2020-09-10 MED ORDER — EPHEDRINE 5 MG/ML INJ
INTRAVENOUS | Status: AC
  Filled 2020-09-10: qty 10

## 2020-09-10 MED ORDER — EPHEDRINE SULFATE-NACL 50-0.9 MG/10ML-% IV SOSY
PREFILLED_SYRINGE | INTRAVENOUS | Status: DC | PRN
  Administered 2020-09-10: 15 mg via INTRAVENOUS

## 2020-09-10 MED ORDER — 0.9 % SODIUM CHLORIDE (POUR BTL) OPTIME
TOPICAL | Status: DC | PRN
  Administered 2020-09-10: 500 mL

## 2020-09-10 MED ORDER — OXYCODONE HCL 5 MG/5ML PO SOLN: 5.0000 mg | Freq: Once | ORAL | Status: DC | PRN

## 2020-09-10 MED ORDER — AMISULPRIDE (ANTIEMETIC) 5 MG/2ML IV SOLN: 10.0000 mg | Freq: Once | INTRAVENOUS | Status: DC | PRN

## 2020-09-10 MED ORDER — DEXAMETHASONE SODIUM PHOSPHATE 10 MG/ML IJ SOLN
INTRAMUSCULAR | Status: AC
  Filled 2020-09-10: qty 1

## 2020-09-10 MED ORDER — ONDANSETRON HCL 4 MG/2ML IJ SOLN
INTRAMUSCULAR | Status: DC | PRN
  Administered 2020-09-10: 4 mg via INTRAVENOUS

## 2020-09-10 MED ORDER — LACTATED RINGERS IV SOLN
INTRAVENOUS | Status: DC
  Administered 2020-09-10: 1000 mL via INTRAVENOUS

## 2020-09-10 MED ORDER — PROPOFOL 10 MG/ML IV BOLUS
INTRAVENOUS | Status: DC | PRN
  Administered 2020-09-10: 80 mg via INTRAVENOUS

## 2020-09-10 MED ORDER — DEXMEDETOMIDINE (PRECEDEX) IN NS 20 MCG/5ML (4 MCG/ML) IV SYRINGE
PREFILLED_SYRINGE | INTRAVENOUS | Status: DC | PRN
  Administered 2020-09-10: 8 ug via INTRAVENOUS

## 2020-09-10 MED ORDER — MEPERIDINE HCL 25 MG/ML IJ SOLN: 6.2500 mg | INTRAMUSCULAR | Status: DC | PRN

## 2020-09-10 MED ORDER — LIDOCAINE 2% (20 MG/ML) 5 ML SYRINGE
INTRAMUSCULAR | Status: DC | PRN
  Administered 2020-09-10: 80 mg via INTRAVENOUS

## 2020-09-10 MED ORDER — ONDANSETRON HCL 4 MG/2ML IJ SOLN: 4.0000 mg | Freq: Once | INTRAMUSCULAR | Status: DC | PRN

## 2020-09-10 MED ORDER — MIDAZOLAM HCL 5 MG/5ML IJ SOLN
INTRAMUSCULAR | Status: DC | PRN
  Administered 2020-09-10 (×2): 1 mg via INTRAVENOUS

## 2020-09-10 MED ORDER — CEFAZOLIN SODIUM-DEXTROSE 2-4 GM/100ML-% IV SOLN
2.0000 g | INTRAVENOUS | Status: AC
  Administered 2020-09-10: 2 g via INTRAVENOUS

## 2020-09-10 SURGICAL SUPPLY — 41 items
ADH SKN CLS APL DERMABOND .7 (GAUZE/BANDAGES/DRESSINGS) ×2
BLADE SURG 11 STRL SS (BLADE) ×3 IMPLANT
BLADE SURG 15 STRL LF DISP TIS (BLADE) ×2 IMPLANT
BLADE SURG 15 STRL SS (BLADE) ×3
CATH ROBINSON RED A/P 16FR (CATHETERS) ×3 IMPLANT
CLOTH BEACON ORANGE TIMEOUT ST (SAFETY) ×3 IMPLANT
COVER MAYO STAND STRL (DRAPES) ×3 IMPLANT
COVER WAND RF STERILE (DRAPES) ×3 IMPLANT
DECANTER SPIKE VIAL GLASS SM (MISCELLANEOUS) IMPLANT
DERMABOND ADVANCED (GAUZE/BANDAGES/DRESSINGS) ×1
DERMABOND ADVANCED .7 DNX12 (GAUZE/BANDAGES/DRESSINGS) ×2 IMPLANT
DEVICE CAPIO SLIM SINGLE (INSTRUMENTS) IMPLANT
DISSECTOR ROUND CHERRY 3/8 STR (MISCELLANEOUS) IMPLANT
GAUZE 4X4 16PLY RFD (DISPOSABLE) ×4 IMPLANT
GAUZE PACKING 2X5 YD STRL (GAUZE/BANDAGES/DRESSINGS) IMPLANT
GLOVE BIO SURGEON STRL SZ8 (GLOVE) ×3 IMPLANT
GOWN STRL REUS W/TWL LRG LVL3 (GOWN DISPOSABLE) ×3 IMPLANT
HOLDER FOLEY CATH W/STRAP (MISCELLANEOUS) ×1 IMPLANT
IV NS 1000ML (IV SOLUTION) ×3
IV NS 1000ML BAXH (IV SOLUTION) IMPLANT
KIT TURNOVER CYSTO (KITS) ×3 IMPLANT
NDL MAYO CATGUT SZ4 TPR NDL (NEEDLE) IMPLANT
NEEDLE HYPO 22GX1.5 SAFETY (NEEDLE) IMPLANT
NEEDLE MAYO CATGUT SZ4 (NEEDLE) IMPLANT
NS IRRIG 500ML POUR BTL (IV SOLUTION) ×3 IMPLANT
PACK VAGINAL WOMENS (CUSTOM PROCEDURE TRAY) ×3 IMPLANT
PAD OB MATERNITY 4.3X12.25 (PERSONAL CARE ITEMS) ×3 IMPLANT
PLUG CATH AND CAP STER (CATHETERS) ×3 IMPLANT
SET IRRIG Y TYPE TUR BLADDER L (SET/KITS/TRAYS/PACK) ×3 IMPLANT
SLING HALO OBTRYX (Sling) ×3 IMPLANT
SLING HALO OBTRYX NDL (Sling) IMPLANT
STRIP CLOSURE SKIN 1/2X4 (GAUZE/BANDAGES/DRESSINGS) IMPLANT
SUT CAPIO POLYGLYCOLIC (SUTURE) IMPLANT
SUT VIC AB 2-0 CT1 27 (SUTURE) ×3
SUT VIC AB 2-0 CT1 TAPERPNT 27 (SUTURE) ×2 IMPLANT
SUT VIC AB 2-0 CT2 27 (SUTURE) IMPLANT
SYR 10ML LL (SYRINGE) IMPLANT
SYR BULB IRRIG 60ML STRL (SYRINGE) ×3 IMPLANT
TOWEL OR 17X26 10 PK STRL BLUE (TOWEL DISPOSABLE) ×3 IMPLANT
TRAY FOLEY W/BAG SLVR 14FR LF (SET/KITS/TRAYS/PACK) ×3 IMPLANT
WATER STERILE IRR 500ML POUR (IV SOLUTION) ×2 IMPLANT

## 2020-09-10 NOTE — Anesthesia Procedure Notes (Signed)
Procedure Name: LMA Insertion Date/Time: 09/10/2020 7:31 AM Performed by: Rogers Blocker, CRNA Pre-anesthesia Checklist: Patient identified, Emergency Drugs available, Suction available and Patient being monitored Patient Re-evaluated:Patient Re-evaluated prior to induction Oxygen Delivery Method: Circle System Utilized Preoxygenation: Pre-oxygenation with 100% oxygen Induction Type: IV induction Ventilation: Mask ventilation without difficulty LMA: LMA inserted LMA Size: 4.0 Number of attempts: 1 Airway Equipment and Method: Bite block Placement Confirmation: positive ETCO2 Tube secured with: Tape Dental Injury: Teeth and Oropharynx as per pre-operative assessment

## 2020-09-10 NOTE — Anesthesia Postprocedure Evaluation (Signed)
Anesthesia Post Note  Patient: Norma Cameron  Procedure(s) Performed: TRANSVAGINAL TAPE (TVT) PROCEDURE (N/A Vagina ) CYSTOSCOPY (N/A Bladder)     Patient location during evaluation: PACU Anesthesia Type: General Level of consciousness: awake and alert Pain management: pain level controlled Vital Signs Assessment: post-procedure vital signs reviewed and stable Respiratory status: spontaneous breathing, nonlabored ventilation, respiratory function stable and patient connected to nasal cannula oxygen Cardiovascular status: blood pressure returned to baseline and stable Postop Assessment: no apparent nausea or vomiting Anesthetic complications: no   No complications documented.  Last Vitals:  Vitals:   09/10/20 0845 09/10/20 0900  BP: (!) 149/71 (!) 152/75  Pulse: 76 73  Resp: 10 14  Temp:  (!) 36.4 C  SpO2: 97% 95%    Last Pain:  Vitals:   09/10/20 0900  TempSrc:   PainSc: 0-No pain                 Barnet Glasgow

## 2020-09-10 NOTE — Op Note (Signed)
NAME: Norma Cameron, Norma Cameron MEDICAL RECORD [id number] ACCOUNT [id number] DATE OF BIRTH:1956-09-24 FACILITY: [facility] LOCATION: [facility] PHYSICIAN:Nianna Igo E. Aunica Dauphinee II, MD  OPERATIVE REPORT  DATE OF PROCEDURE:  09/10/2020  PREOPERATIVE DIAGNOSIS:  Stress urinary incontinence.  POSTOPERATIVE DIAGNOSIS:  Stress urinary incontinence.  PROCEDURE:  Transobturator mid-urethral sling and cystoscopy.  SURGEON:  Everlene Farrier, MD   ANESTHESIA:  General with LMA.  ESTIMATED BLOOD LOSS:  200 mL.  SPECIMENS:  None.  INDICATIONS AND CONSENT:  This patient is a 64 year old patient with stress incontinence.  Details are dictated in history and physical.  Transobturator midurethral sling has been discussed preoperatively.  Potential risks and complications were  discussed preoperatively including but not limited to infection, organ damage, bleeding requiring transfusion of blood products with HIV and hepatitis acquisition, DVT, PE, pneumonia, prolonged postoperative catheterization urinary retention, recurrent  or persistent stress incontinence, pelvic pain, painful intercourse, return to the operating room, and erosion of the sling.  The patient states she understands and agrees and consent was signed on the chart.  DESCRIPTION OF PROCEDURE:  The patient was taken to the operating room where she was identified, placed in the dorsal supine position and general anesthesia was induced via LMA.  She was placed in the dorsal lithotomy position.  Timeout was undertaken.   She was prepped the vulva and vagina with Betadine.  Bladder is straight catheterized and she was draped in a sterile fashion.  Weighted speculum was placed.  The points for the groin incisions on either side over the obturator foramen are marked being  careful to go well below the adductor longus.  These areas were then injected with lidocaine with epinephrine.  A Foley catheter was placed in the bladder.  The suburethral vaginal mucosa was also  injected.  Stab incisions were made in the groin  bilaterally.  A linear suburethral incision was made long enough to accommodate the examining finger.  Dissection was carried out bilaterally to the obturator membrane.  The Obtryx halo needles were then passed bilaterally passing them just beyond the  edge of the pubic ramus with the passage of the tip guided at all times by the examining finger.  With the needles in place bilaterally Foley catheter was removed and cystoscopy was done with a 70-degree cystoscope.  A 360-degree inspection reveals the  bubble noted.  There are no foreign bodies.  Urethral meatus was noted bilaterally with a good puff of urine and there is no evidence of perforation of the bladder.  Cystoscope was removed.  Foley catheter was replaced.  Bladder was drained and the Foley  catheter was left in place.  The sling was then placed on the needle tips bilaterally, which were then withdrawn bilaterally.  Sheath was removed with care being taken not to exert any undue tension on the sling.  Inspection reveals the sling to be  lying flat with a Kelly clamp placed behind the sling easily rotated 180 degrees perpendicular to the floor with no tension.  The suburethral incision was closed with a running locking 2-0 Vicryl suture.  Excess sling was trimmed at the skin edges on the  groin bilaterally and these were then closed with glue.  All counts were correct.  The patient is awakened and taken to recovery room in stable condition.  CN/NUANCE  D:09/10/2020 T:09/10/2020 JOB:[id number]

## 2020-09-10 NOTE — Progress Notes (Signed)
Dr. Gaetano Net called regarding large gel clot in voiding hat and commode. Advised to move to phase 2, give fluids to drink, have her void and do bladder scan to check for residual urine

## 2020-09-10 NOTE — Progress Notes (Signed)
No change to H&P per patient history Reviewed with patient procedure-transobturator mid urethral sling All questions answered Post operative instructions reviewed Patient states she understands and agrees

## 2020-09-10 NOTE — Progress Notes (Signed)
09/10/2020  8:18 AM  PATIENT:  Norma Cameron  64 y.o. female  PRE-OPERATIVE DIAGNOSIS:  STRESS INCONTINENCE  POST-OPERATIVE DIAGNOSIS:  STRESS INCONTINENCE  PROCEDURE:  Procedure(s): TRANSVAGINAL TAPE (TVT) PROCEDURE (N/A) CYSTOSCOPY (N/A)  SURGEON:  Surgeon(s) and Role:    * Everlene Farrier, MD - Primary      PHYSICIAN ASSISTANT:   ASSISTANTS: none   ANESTHESIA:   general  EBL:  200 mL   BLOOD ADMINISTERED:none  DRAINS: Urinary Catheter (Foley)   LOCAL MEDICATIONS USED:  LIDOCAINE   SPECIMEN:  No Specimen  DISPOSITION OF SPECIMEN:  N/A  COUNTS:  YES  TOURNIQUET:  * No tourniquets in log *  DICTATION: [id number]  PLAN OF CARE: Discharge to home after PACU  PATIENT DISPOSITION:  PACU - hemodynamically stable.   Delay start of Pharmacological VTE agent (>24hrs) due to surgical blood loss or risk of bleeding: not applicable

## 2020-09-10 NOTE — Discharge Instructions (Signed)
°  Post Anesthesia Home Care Instructions  Activity: Get plenty of rest for the remainder of the day. A responsible adult should stay with you for 24 hours following the procedure.  For the next 24 hours, DO NOT: -Drive a car -Paediatric nurse -Drink alcoholic beverages -Take any medication unless instructed by your physician -Make any legal decisions or sign important papers.  Meals: Start with liquid foods such as gelatin or soup. Progress to regular foods as tolerated. Avoid greasy, spicy, heavy foods. If nausea and/or vomiting occur, drink only clear liquids until the nausea and/or vomiting subsides. Call your physician if vomiting continues.  Special Instructions/Symptoms: Your throat may feel dry or sore from the anesthesia or the breathing tube placed in your throat during surgery. If this causes discomfort, gargle with warm salt water. The discomfort should disappear within 24 hours.  If you had a scopolamine patch placed behind your ear for the management of post- operative nausea and/or vomiting:  1. The medication in the patch is effective for 72 hours, after which it should be removed.  Wrap patch in a tissue and discard in the trash. Wash hands thoroughly with soap and water. 2. You may remove the patch earlier than 72 hours if you experience unpleasant side effects which may include dry mouth, dizziness or visual disturbances. 3. Avoid touching the patch. Wash your hands with soap and water after contact with the patch.  try to "double void" void then pause for a minute and try again do not strain  You may have somne blood tinged urine

## 2020-09-10 NOTE — Transfer of Care (Signed)
Immediate Anesthesia Transfer of Care Note  Patient: Norma Cameron  Procedure(s) Performed: TRANSVAGINAL TAPE (TVT) PROCEDURE (N/A Vagina ) CYSTOSCOPY (N/A Bladder)  Patient Location: PACU  Anesthesia Type:General  Level of Consciousness: awake, alert , oriented and patient cooperative  Airway & Oxygen Therapy: Patient Spontanous Breathing  Post-op Assessment: Report given to RN, Post -op Vital signs reviewed and stable and Patient moving all extremities  Post vital signs: Reviewed and stable  Last Vitals:  Vitals Value Taken Time  BP 157/72 09/10/20 0823  Temp    Pulse 87 09/10/20 0825  Resp 13 09/10/20 0825  SpO2 95 % 09/10/20 0825  Vitals shown include unvalidated device data.  Last Pain:  Vitals:   09/10/20 0625  TempSrc: Oral  PainSc: 0-No pain      Patients Stated Pain Goal: 5 (22/41/14 6431)  Complications: No complications documented.

## 2020-09-11 ENCOUNTER — Encounter (HOSPITAL_BASED_OUTPATIENT_CLINIC_OR_DEPARTMENT_OTHER): Payer: Self-pay | Admitting: Obstetrics and Gynecology

## 2020-09-14 DIAGNOSIS — R339 Retention of urine, unspecified: Secondary | ICD-10-CM | POA: Diagnosis not present

## 2020-10-01 ENCOUNTER — Ambulatory Visit (INDEPENDENT_AMBULATORY_CARE_PROVIDER_SITE_OTHER): Payer: BC Managed Care – PPO | Admitting: Adult Health

## 2020-10-01 ENCOUNTER — Encounter: Payer: Self-pay | Admitting: Adult Health

## 2020-10-01 ENCOUNTER — Other Ambulatory Visit: Payer: Self-pay

## 2020-10-01 VITALS — BP 136/82 | HR 81 | Temp 97.9°F | Wt 145.0 lb

## 2020-10-01 DIAGNOSIS — J019 Acute sinusitis, unspecified: Secondary | ICD-10-CM | POA: Diagnosis not present

## 2020-10-01 DIAGNOSIS — R42 Dizziness and giddiness: Secondary | ICD-10-CM | POA: Diagnosis not present

## 2020-10-01 MED ORDER — PREDNISONE 20 MG PO TABS: ORAL_TABLET | ORAL | 0 refills | Status: DC

## 2020-10-01 MED ORDER — MECLIZINE HCL 25 MG PO TABS: ORAL_TABLET | ORAL | 0 refills | Status: DC

## 2020-10-01 NOTE — Progress Notes (Signed)
Assessment and Plan:  Diagnoses and all orders for this visit:  Vertigo normal neuro, does have history of vertigo in the past, meclizine PRN, exercises given, add bASA, if worsening HA, changes vision/speech, imbalance, weakness go to the ER     -     meclizine (ANTIVERT) 25 MG tablet; 1/2-1 pill up to 3 times daily for motion sickness/dizziness  Acute rhinosinusitis Possible she has mild covid 19 breakthrough case but has tested negative; advised to remain at home until sx improve Benign exam with intermittent sx; ears normal without effusion Discussed the importance of avoiding unnecessary antibiotic therapy. Suggested symptomatic OTC remedies. Nasal saline spray for congestion. Nasal steroids, rotate allergy pill, oral steroids offered Follow up as needed. -     predniSONE (DELTASONE) 20 MG tablet; 2 tablets daily for 3 days, 1 tablet daily for 4 days.   Further disposition pending results of labs. Discussed med's effects and SE's.   Over 20  minutes of exam, counseling, chart review, and critical decision making was performed.   Future Appointments  Date Time Provider [place]  10/01/2020  3:30 PM Liane Comber, NP [facility] None  12/11/2020  2:00 PM McClanahan, Danton Sewer, NP [facility] None    ------------------------------------------------------------------------------------------------------------------   HPI BP 136/82   Pulse 81   Temp 97.9 F (36.6 C)   Wt 145 lb (65.8 kg)   SpO2 96%   BMI 27.40 kg/m   64 y.o.female presents for evaluation of 3-4 days of bil ear pressure, rhinitis, sinus pressure, dizzy episodes. Notably husband tested positive for covid 19 10 days ago. She reports has had 3 negative covid 19 tests, last was negative rapid this AM. She had J&J vaccine 6 months ago.   She reports last 3-4 days she has woken HA in AM, "sinus pressure" which seems to resolve within an hour, but with persistent bilateral ear pressure and intermittent bouts of  dizziness with any rapid head movements (if turning head quickly or bends over), with sensation that room is spinning, 1 episode of emesis following dizziness.  She denies vision changes, neck pain/rigidity, numbness/tingling, weakness, unsteady gait. Dizziness quickly resolves with rest. None associated with sitting to standing, denies with neck extension. She reports pressure in bil ears, without changes in hearing, tiniitis, discharge.   Has tried dramamine which has seemed to help   Also with hx of allergies, typically worse in fall, taking zyrtec. Denies sneezing, itching. Does endorse post nasal drip and some hoarseness typical for this season. She has not tried nasal sprays.   He reports hx of vertigo remotely that responded to oral medication without recurrence.  Remote former smoker  Past Medical History:  Diagnosis Date  . Allergy   . Anxiety   . 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 BCC (basal cell carcinoma of skin) 10/07/2011   Right Inner Shin  . Depression    CONTROLLED WITH OCCASIONAL XANAX  . Fibrocystic breast disease   . GERD (gastroesophageal reflux disease)   . Hyperlipidemia   . Osteopenia   . Prediabetes 09/05/2014  . Vitamin D deficiency      Allergies  Allergen Reactions  . Acyclovir And Related   . Eggs Or Egg-Derived Products Hives    Patient states she eats eggs with no problem.  Received propofol with no reaction.   . Neosporin [Neomycin-Bacitracin Zn-Polymyx] Hives  . Red Yeast Rice [Cholestin] Other (See Comments)    MYALGIA with higher doses  . Sulfa  Antibiotics Hives  . Zostavax [Zoster Vaccine Live] Rash    Current Outpatient Medications on File Prior to Visit  Medication Sig  . ALPRAZolam (XANAX) 0.5 MG tablet Take 0.5 mg by mouth 2 (two) times daily. AS NEEDED RARELY TAKES PER PT  . cetirizine (ZYRTEC) 10 MG chewable tablet Chew 10 mg by mouth daily.  Marland Kitchen escitalopram (LEXAPRO) 20 MG tablet Take 20 mg by  mouth daily.  Marland Kitchen estradiol (ESTRACE) 2 MG tablet Take 2 mg by mouth daily.  . Magnesium 400 MG TABS Take 400 mg by mouth daily. CURRENLTY 30 MG DAILY  . omeprazole (PRILOSEC) 40 MG capsule Take 40 mg by mouth daily.  Marland Kitchen OVER THE COUNTER MEDICATION COLLAGEN POWDER ONE SCOOP IN COFFEE DAILY  . Probiotic Product (PROBIOTIC DAILY PO) Take by mouth daily.  . progesterone (PROMETRIUM) 100 MG capsule Take 100 mg by mouth daily.  Marland Kitchen zolpidem (AMBIEN) 10 MG tablet Take 10 mg by mouth at bedtime as needed for sleep.  Marland Kitchen oxyCODONE (ROXICODONE) 5 MG immediate release tablet Take 1 tablet (5 mg total) by mouth every 6 (six) hours as needed for severe pain.   No current facility-administered medications on file prior to visit.    ROS: all negative except above.   Physical Exam:  BP 136/82   Pulse 81   Temp 97.9 F (36.6 C)   Wt 145 lb (65.8 kg)   SpO2 96%   BMI 27.40 kg/m   General Appearance: Well nourished, in no apparent distress. Eyes: PERRLA, EOMs, conjunctiva no swelling or erythema Sinuses: No Frontal/maxillary tenderness ENT/Mouth: Ext aud canals clear, TMs without erythema, bulging. No erythema, swelling, or exudate on post pharynx.  Tonsils not swollen or erythematous. Hearing normal. Dizziness ellicited with rapid head movements.  Neck: Supple, thyroid normal.  Respiratory: Respiratory effort normal, BS equal bilaterally without rales, rhonchi, wheezing or stridor.  Cardio: RRR with no MRGs. Brisk peripheral pulses without edema. No dizziness with neck extension. No carotid bruits Abdomen: Soft, + BS.  Non tender. Lymphatics: Non tender without lymphadenopathy.  Musculoskeletal: Full ROM, symmetrical strength upper and lower extremities, normal gait.  Skin: Warm, dry without rashes, lesions, ecchymosis.  Neuro: Cranial nerves intact. Normal muscle tone, no cerebellar symptoms. Negative rapid alternating, heel to shin. Negative for pronator drift. Sensation intact.  Psych: Awake and  oriented X 3, normal affect, Insight and Judgment appropriate.     Izora Ribas, NP 3:14 PM [facility]

## 2020-10-01 NOTE — Patient Instructions (Signed)
Recommend getting on daily baby aspirin until fully resolved - + 2-4 weeks  Steroid taper -   As needed can add meclizine    Dizziness Dizziness is a common problem. It is a feeling of unsteadiness or light-headedness. You may feel like you are about to faint. Dizziness can lead to injury if you stumble or fall. Anyone can become dizzy, but dizziness is more common in older adults. This condition can be caused by a number of things, including medicines, dehydration, or illness. Follow these instructions at home: Eating and drinking  Drink enough fluid to keep your urine clear or pale yellow. This helps to keep you from becoming dehydrated. Try to drink more clear fluids, such as water.  Do not drink alcohol.  Limit your caffeine intake if told to do so by your health care provider. Check ingredients and nutrition facts to see if a food or beverage contains caffeine.  Limit your salt (sodium) intake if told to do so by your health care provider. Check ingredients and nutrition facts to see if a food or beverage contains sodium. Activity  Avoid making quick movements. ? Rise slowly from chairs and steady yourself until you feel okay. ? In the morning, first sit up on the side of the bed. When you feel okay, stand slowly while you hold onto something until you know that your balance is fine.  If you need to stand in one place for a long time, move your legs often. Tighten and relax the muscles in your legs while you are standing.  Do not drive or use heavy machinery if you feel dizzy.  Avoid bending down if you feel dizzy. Place items in your home so that they are easy for you to reach without leaning over. Lifestyle  Do not use any products that contain nicotine or tobacco, such as cigarettes and e-cigarettes. If you need help quitting, ask your health care provider.  Try to reduce your stress level by using methods such as yoga or meditation. Talk with your health care provider if  you need help to manage your stress. General instructions  Watch your dizziness for any changes.  Take over-the-counter and prescription medicines only as told by your health care provider. Talk with your health care provider if you think that your dizziness is caused by a medicine that you are taking.  Tell a friend or a family member that you are feeling dizzy. If he or she notices any changes in your behavior, have this person call your health care provider.  Keep all follow-up visits as told by your health care provider. This is important. Contact a health care provider if:  Your dizziness does not go away.  Your dizziness or light-headedness gets worse.  You feel nauseous.  You have reduced hearing.  You have new symptoms.  You are unsteady on your feet or you feel like the room is spinning. Get help right away if:  You vomit or have diarrhea and are unable to eat or drink anything.  You have problems talking, walking, swallowing, or using your arms, hands, or legs.  You feel generally weak.  You are not thinking clearly or you have trouble forming sentences. It may take a friend or family member to notice this.  You have chest pain, abdominal pain, shortness of breath, or sweating.  Your vision changes.  You have any bleeding.  You have a severe headache.  You have neck pain or a stiff neck.  You have  a fever. These symptoms may represent a serious problem that is an emergency. Do not wait to see if the symptoms will go away. Get medical help right away. Call your local emergency services (911 in the [place] Do not drive yourself to the hospital. Summary  Dizziness is a feeling of unsteadiness or light-headedness. This condition can be caused by a number of things, including medicines, dehydration, or illness.  Anyone can become dizzy, but dizziness is more common in older adults.  Drink enough fluid to keep your urine clear or pale yellow. Do not drink  alcohol.  Avoid making quick movements if you feel dizzy. Monitor your dizziness for any changes. This information is not intended to replace advice given to you by your health care provider. Make sure you discuss any questions you have with your health care provider. Document Revised: 11/27/2017 Document Reviewed: 12/27/2016 Elsevier Patient Education  2020 [employer].

## 2020-12-11 ENCOUNTER — Encounter: Payer: Self-pay | Admitting: Adult Health Nurse Practitioner

## 2020-12-11 ENCOUNTER — Ambulatory Visit (INDEPENDENT_AMBULATORY_CARE_PROVIDER_SITE_OTHER): Payer: BC Managed Care – PPO | Admitting: Adult Health Nurse Practitioner

## 2020-12-11 ENCOUNTER — Other Ambulatory Visit: Payer: Self-pay

## 2020-12-11 VITALS — BP 124/76 | HR 71 | Temp 97.7°F | Ht 61.0 in | Wt 146.0 lb

## 2020-12-11 DIAGNOSIS — Z13220 Encounter for screening for lipoid disorders: Secondary | ICD-10-CM

## 2020-12-11 DIAGNOSIS — Z131 Encounter for screening for diabetes mellitus: Secondary | ICD-10-CM | POA: Diagnosis not present

## 2020-12-11 DIAGNOSIS — Z0000 Encounter for general adult medical examination without abnormal findings: Secondary | ICD-10-CM | POA: Diagnosis not present

## 2020-12-11 DIAGNOSIS — Z1329 Encounter for screening for other suspected endocrine disorder: Secondary | ICD-10-CM | POA: Diagnosis not present

## 2020-12-11 DIAGNOSIS — Z1389 Encounter for screening for other disorder: Secondary | ICD-10-CM | POA: Diagnosis not present

## 2020-12-11 DIAGNOSIS — F3341 Major depressive disorder, recurrent, in partial remission: Secondary | ICD-10-CM

## 2020-12-11 DIAGNOSIS — Z136 Encounter for screening for cardiovascular disorders: Secondary | ICD-10-CM | POA: Diagnosis not present

## 2020-12-11 DIAGNOSIS — Z0001 Encounter for general adult medical examination with abnormal findings: Secondary | ICD-10-CM

## 2020-12-11 DIAGNOSIS — E782 Mixed hyperlipidemia: Secondary | ICD-10-CM

## 2020-12-11 DIAGNOSIS — Z79899 Other long term (current) drug therapy: Secondary | ICD-10-CM

## 2020-12-11 DIAGNOSIS — I10 Essential (primary) hypertension: Secondary | ICD-10-CM | POA: Diagnosis not present

## 2020-12-11 DIAGNOSIS — E559 Vitamin D deficiency, unspecified: Secondary | ICD-10-CM | POA: Diagnosis not present

## 2020-12-11 DIAGNOSIS — N951 Menopausal and female climacteric states: Secondary | ICD-10-CM

## 2020-12-11 DIAGNOSIS — R7309 Other abnormal glucose: Secondary | ICD-10-CM

## 2020-12-11 NOTE — Patient Instructions (Addendum)
[facility] for valuation of your Left Toe.  [address], [place], [place] [address]  [phone]    [place] what a healthy weight is for you (roughly BMI <25) and aim to maintain this  Aim for 7+ servings of fruits and vegetables daily  70-80+ fluid ounces of water or unsweet tea for healthy kidneys  Limit to max 1 drink of alcohol per day; avoid smoking/tobacco  Limit animal fats in diet for cholesterol and heart health - choose grass fed whenever available  Avoid highly processed foods, and foods high in saturated/trans fats  Aim for low stress - take time to unwind and care for your mental health  Aim for 150 min of moderate intensity exercise weekly for heart health, and weights twice weekly for bone health  Aim for 7-9 hours of sleep daily   Keep up the good work with your weight loss!    8 Critical Weight-Loss Tips That Aren't Diet and Exercise  1. STARVE THE DISTRACTIONS  All too often when we eat, we're also multitasking: watching TV, answering emails, scrolling through social media. These habits are detrimental to having a strong, clear, healthy relationship with food, and they can hinder our ability to make dietary changes.  In order to truly focus on what you're eating, how much you're eating, why you're eating those specific foods and, most importantly, how those foods make you feel, you need to starve the distractions. That means when you eat, just eat. Focus on your food, the process it went through to end up on your plate, where it came from and how it nourishes you. With this technique, you're more likely to finish a meal feeling satiated.  2.  CONSIDER WHAT YOU'RE NOT WILLING TO DO  This might sound counterintuitive, but it can help provide a "why" when motivation is waning. Declare, in writing, what you are unwilling to do, for example "I am unwilling to be the old dad who cannot play sports with my children".  So  consider what you're not willing to accept, write it down, and keep it at the ready.  3.  STOP LABELING FOOD "GOOD" AND "BAD"  You've probably heard someone say they ate something "bad." Maybe you've even said it yourself.  The trouble with 'bad' foods isn't that they'll send you to the grave after a bite or two. The trouble comes when we eat excessive portions of really calorie-dense foods meal after meal, day after day.  Instead of labeling foods as good or bad, think about which foods you can eat a lot of, and which ones you should just eat a little of. Then, plan ways to eat the foods you really like in portions that fit with your overall goals. A good example of this would be having a slice of pizza alongside a club salad with chicken breast, avocado and a bit of dressing. This is vastly different than 3 slices of pizza, 4 breadsticks with cheese sauce and half of a liter of regular soda.  4.  BRUSH YOUR TEETH AFTER YOU EAT  Getting your mindset in order is important, but sometimes small habits can make a big difference. After eating, you still have the taste of food in their mouth, which often causes people to eat more even if they are full or engage in a nibble or two of dessert.  Brushing your teeth will remove the taste of food from your mouth, and the clean,  minty freshness will serve as a cue that mealtime is over.  5.  FOCUS ON CROWDING NOT CUTTING  The most common first step during 'dieting' is to cut. We cut our portion sizes down, we cut out 'bad' foods, we cut out entire food groups. This act of cutting puts [place] and our minds into scarcity mode.  When something is off-limits, even if you're able to avoid it for a while, you could end up bingeing on it later because you've gone so long without it. So, instead of cutting, focus on crowding. If you crowd your plate and fill it up with more foods like veggies and protein, it simply allows less room for the other stuff. In other words,  shift your focus away from what you can't eat, and celebrate the foods that will help you reach your goals.  6.  TAKE TRACKING A STEP FURTHER  Track what you eat, when you ate it, how much you ate and how that food made you feel. Being completely honest with yourself and writing down every single thing that passes through your lips will help you start to notice that maybe you actually do snack, possibly take in more sugar than you thought, eat when you're bored rather than just hungry or maybe that you have a habit of snacking before bed while watching TV.  The difference from simply tracking your food intake is you're taking into account how food makes you feel, as well as what you're doing while you're eating. This is about becoming more mindful of what, when and why you eat.  7.  PRIORITIZE GOOD SLEEP  One of the strongest risk factors for being overweight is poor sleep. When you're feeling tired, you're more likely to choose unhealthy comfort foods and to skip your workout. Additionally, sleep deprivation may slow down your metabolism. Burnett Kanaris! Therefore, sleeping 7-8 hours per night can help with weight loss without having to change your diet or increase your physical activity. And if you feel you snore and still wake up tired, talk with me about sleep apnea.  8.  SET ASIDE TIME TO DISCONNECT  Just get out there. Disconnect from the electronics and connect to the elements. Not only will this help reduce stress (a major factor in weight gain) by giving your mind a break from the constant stimulation we've all become so accustomed to, but it may also reprogram your brain to connect with yourself and what you're feeling.

## 2020-12-11 NOTE — Progress Notes (Signed)
COMPLETE PHYSICAL  Assessment and Plan:  Encounter for general adult medical examination with abnormal findings -     CBC with Diff -     COMPLETE METABOLIC PANEL WITH GFR -     TSH -     Lipid Profile -     Hemoglobin A1c ([facility]) -     Magnesium -     Vitamin D (25 hydroxy) -     Urinalysis, Routine w reflex microscopic -     Microalbumin / Creatinine Urine Ratio  Mixed hyperlipidemia check lipids decrease fatty foods increase activity.  -     TSH -     Lipid Profile  Recurrent major depressive disorder, in partial remission (HCC) Continue lexapro 10 mg  Vitamin D deficiency -     Vitamin D (25 hydroxy)  Allergy, initial encounter Continue OTC meds  Climacteric Follows with Dr. Jennette Kettle  Abnormal glucose -     Hemoglobin A1c ([facility]) Discussed disease progression and risks Discussed diet/exercise, weight management and risk modification  Screening for blood or protein in urine -     Urinalysis, Routine w reflex microscopic -     Microalbumin / Creatinine Urine Ratio  Gastroesophageal reflux disease without esophagitis Refilled Prilosec 40 mg to pharamcy   Medication management Continued  Discussed med's effects and SE's. Screening labs and tests as requested with regular follow-up as recommended.  HPI  65 y.o. female  presents for a rescheduled complete physical, late due to husband having non covid double pneumonia in Sept. a Her blood pressure has been controlled at home, today their BP is BP: 124/76.  She does workout, since the pandemic she is not attending classes but she is walking/riding bikes. She denies chest pain, shortness of breath, dizziness.  BMI is Body mass index is 27.59 kg/m., she is working on diet and exercise. Wt Readings from Last 3 Encounters:  12/11/20 146 lb (66.2 kg)  10/01/20 145 lb (65.8 kg)  09/10/20 147 lb 1.6 oz (66.7 kg)   She works with [employer], does everything over the phone in the office.   She just started HRT  with Dr. Jennette Kettle.   she is still on lexapro only on 10mg  a day. She takes xanax 1/2 very rarely per patient.  Had DEXA 11/2020, MGM 03/2019, PAP 07/2019 per patient- normal.   She is not on cholesterol medication and denies myalgias. Her cholesterol is not at goal. The cholesterol last visit was:  Lab Results  Component Value Date   CHOL 271 (H) 03/14/2020   HDL 59 03/14/2020   LDLCALC 160 (H) 03/14/2020   TRIG 340 (H) 03/14/2020   CHOLHDL 4.6 03/14/2020  . She has been working on diet and exercise for prediabetes, she is on bASA, she is not on ACE/ARB and denies foot ulcerations, hyperglycemia, hypoglycemia , increased appetite, nausea, paresthesia of the feet, polydipsia, polyuria, visual disturbances, vomiting and weight loss. Last A1C in the office was:  Lab Results  Component Value Date   HGBA1C 5.5 12/12/2019   Patient is on Vitamin D supplement, vitamin D/calcium combo and vitamin D 2000 IU.    Lab Results  Component Value Date   VD25OH 23 (L) 12/12/2019      Current Medications:  Current Outpatient Medications on File Prior to Visit  Medication Sig Dispense Refill  . ALPRAZolam (XANAX) 0.5 MG tablet Take 0.5 mg by mouth 2 (two) times daily. AS NEEDED RARELY TAKES PER PT    . cetirizine (ZYRTEC) 10 MG  chewable tablet Chew 10 mg by mouth daily.    Marland Kitchen escitalopram (LEXAPRO) 20 MG tablet Take 20 mg by mouth daily.    Marland Kitchen estradiol (ESTRACE) 2 MG tablet Take 2 mg by mouth daily.    . Magnesium 400 MG TABS Take 400 mg by mouth daily. CURRENLTY 30 MG DAILY    . omeprazole (PRILOSEC) 40 MG capsule Take 40 mg by mouth daily.    Marland Kitchen OVER THE COUNTER MEDICATION COLLAGEN POWDER ONE SCOOP IN COFFEE DAILY    . Probiotic Product (PROBIOTIC DAILY PO) Take by mouth daily.    . progesterone (PROMETRIUM) 100 MG capsule Take 100 mg by mouth daily.    Marland Kitchen zolpidem (AMBIEN) 10 MG tablet Take 10 mg by mouth at bedtime as needed for sleep.     No current facility-administered medications on file prior to  visit.    Health Maintenance:   Immunization History  Administered Date(s) Administered  . Janssen (J&J) SARS-COV-2 Vaccination 02/17/2020  . Tdap 02/04/2008, 08/03/2018  . Zoster 12/28/2012   Tetanus: 2019 Influenza ALLERGY to EGGS Zostavax: 2014 suggest new one before 70  Pap:  07/2019, never abnormal, Dr. Gaetano Net MGM: 2020  AT Dr. Gaetano Net DEXA: 2020 Dr. Nori Riis Colonoscopy: 2014 due will get Dr. Earlean Shawl-  2024 Last Dental Exam: Dr. Berdine Addison /2021 Last Eye Exam: Dr. Cammy Copa 2021  Patient Care Team: Unk Pinto, MD as PCP - General (Internal Medicine) Macarthur Critchley, [place] as Referring Physician (Optometry) Richmond Campbell, MD as Consulting Physician (Gastroenterology) Maisie Fus, MD as Consulting Physician (Obstetrics and Gynecology) Fayette Pho, MD as Referring Physician (Orthopedic Surgery) Lavonna Monarch, MD as Consulting Physician (Dermatology)  Medical History:  Past Medical History:  Diagnosis Date  . Allergy   . Anxiety   . 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 BCC (basal cell carcinoma of skin) 10/07/2011   Right Inner Shin  . Depression    CONTROLLED WITH OCCASIONAL XANAX  . Fibrocystic breast disease   . GERD (gastroesophageal reflux disease)   . Hyperlipidemia   . Osteopenia   . Prediabetes 09/05/2014  . Vitamin D deficiency    Allergies Allergies  Allergen Reactions  . Acyclovir And Related   . Eggs Or Egg-Derived Products Hives    Patient states she eats eggs with no problem.  Received propofol with no reaction.   . Neosporin [Neomycin-Bacitracin Zn-Polymyx] Hives  . Red Yeast Rice [Cholestin] Other (See Comments)    MYALGIA with higher doses  . Sulfa Antibiotics Hives  . Zostavax [Zoster Vaccine Live] Rash    SURGICAL HISTORY She  has a past surgical history that includes Dilation and curettage of uterus (2006); Novasure ablation (2006); Cervix lesion destruction; Skin cancer excision (Right, 10/2011); Carpal  tunnel release (Right, 12/06/2015); Hernia repair; Bladder suspension (N/A, 09/10/2020); and Cystoscopy (N/A, 09/10/2020). FAMILY HISTORY Her family history includes Cancer in her paternal uncle; Diabetes in her paternal grandfather; Heart disease in her father and paternal grandfather; Hyperlipidemia in her father and mother; Hypertension in her father and mother; Rheum arthritis in her mother. SOCIAL HISTORY She  reports that she quit smoking about 30 years ago. She has never used smokeless tobacco. She reports current alcohol use. She reports that she does not use drugs.'  Review of Systems: Review of Systems  Constitutional: Negative for chills, fever and malaise/fatigue.  HENT: Negative for congestion, ear pain and sore throat.   Respiratory: Negative for cough, shortness of breath and wheezing.   Cardiovascular:  Negative for chest pain, palpitations and leg swelling.  Gastrointestinal: Negative for abdominal pain, blood in stool, constipation, diarrhea, heartburn and melena.  Genitourinary: Negative.   Skin: Negative.   Neurological: Negative for dizziness, sensory change, loss of consciousness and headaches.  Psychiatric/Behavioral: Negative for depression. The patient is not nervous/anxious and does not have insomnia.     Physical Exam: Estimated body mass index is 27.59 kg/m as calculated from the following:   Height as of this encounter: 5\' 1"  (1.549 m).   Weight as of this encounter: 146 lb (66.2 kg). BP 124/76   Pulse 71   Temp 97.7 F (36.5 C)   Ht 5\' 1"  (1.549 m)   Wt 146 lb (66.2 kg)   SpO2 97%   BMI 27.59 kg/m   General Appearance: Well nourished well developed, in no apparent distress.  Eyes: PERRLA, EOMs, conjunctiva no swelling or erythema ENT/Mouth: Ear canals normal without obstruction, swelling, erythema, or discharge.  TMs normal bilaterally with no erythema, bulging, retraction, or loss of landmark.  Oropharynx moist and clear with no exudate, erythema, or  swelling.   Neck: Supple, thyroid normal. No bruits.  No cervical adenopathy Respiratory: Respiratory effort normal, Breath sounds clear A&P without wheeze, rhonchi, rales.   Cardio: RRR without murmurs, rubs or gallops. Brisk peripheral pulses without edema.  Chest: symmetric, with normal excursions Breasts: defer  Abdomen: Soft, nontender, no guarding, rebound, hernias, masses, or organomegaly.  Lymphatics: Non tender without lymphadenopathy.  Musculoskeletal: Full ROM all peripheral extremities,5/5 strength, and normal gait.  Skin: Warm, dry without rashes, lesions, ecchymosis. Neuro: Awake and oriented X 3, Cranial nerves intact, reflexes equal bilaterally. Normal muscle tone, no cerebellar symptoms. Sensation intact.  Psych:  normal affect, Insight and Judgment appropriate.   EKG: NSR-  FAMILY HISTORY CAD IN 80'S    [name], NP 2:56 PM [facility]

## 2020-12-12 LAB — HEMOGLOBIN A1C
Hgb A1c MFr Bld: 5.4 % of total Hgb (ref <5.7)
Mean Plasma Glucose: 108 mg/dL
eAG (mmol/L): 6 mmol/L

## 2020-12-12 LAB — URINALYSIS W MICROSCOPIC + REFLEX CULTURE
Bacteria, UA: NONE SEEN /HPF
Bilirubin Urine: NEGATIVE
Glucose, UA: NEGATIVE
Hgb urine dipstick: NEGATIVE
Hyaline Cast: NONE SEEN /LPF
Ketones, ur: NEGATIVE
Leukocyte Esterase: NEGATIVE
Nitrites, Initial: NEGATIVE
Protein, ur: NEGATIVE
RBC / HPF: NONE SEEN /HPF (ref 0–2)
Specific Gravity, Urine: 1.011 (ref 1.001–1.03)
Squamous Epithelial / HPF: NONE SEEN /HPF (ref <=5)
WBC, UA: NONE SEEN /HPF (ref 0–5)
pH: 6 (ref 5.0–8.0)

## 2020-12-12 LAB — CBC WITH DIFFERENTIAL/PLATELET
Absolute Monocytes: 573 cells/uL (ref 200–950)
Basophils Absolute: 58 cells/uL (ref 0–200)
Basophils Relative: 0.7 %
Eosinophils Absolute: 125 cells/uL (ref 15–500)
Eosinophils Relative: 1.5 %
HCT: 40.7 % (ref 35.0–45.0)
Hemoglobin: 13.6 g/dL (ref 11.7–15.5)
Lymphs Abs: 3461 cells/uL (ref 850–3900)
MCH: 32.7 pg (ref 27.0–33.0)
MCHC: 33.4 g/dL (ref 32.0–36.0)
MCV: 97.8 fL (ref 80.0–100.0)
MPV: 11.2 fL (ref 7.5–12.5)
Monocytes Relative: 6.9 %
Neutro Abs: 4084 cells/uL (ref 1500–7800)
Neutrophils Relative %: 49.2 %
Platelets: 328 10*3/uL (ref 140–400)
RBC: 4.16 10*6/uL (ref 3.80–5.10)
RDW: 12.5 % (ref 11.0–15.0)
Total Lymphocyte: 41.7 %
WBC: 8.3 10*3/uL (ref 3.8–10.8)

## 2020-12-12 LAB — COMPLETE METABOLIC PANEL WITH GFR
AG Ratio: 1.8 (calc) (ref 1.0–2.5)
ALT: 24 U/L (ref 6–29)
AST: 21 U/L (ref 10–35)
Albumin: 4.4 g/dL (ref 3.6–5.1)
Alkaline phosphatase (APISO): 35 U/L — ABNORMAL LOW (ref 37–153)
BUN: 12 mg/dL (ref 7–25)
CO2: 27 mmol/L (ref 20–32)
Calcium: 9.9 mg/dL (ref 8.6–10.4)
Chloride: 100 mmol/L (ref 98–110)
Creat: 0.82 mg/dL (ref 0.50–0.99)
GFR, Est African American: 88 mL/min/{1.73_m2} (ref >=60)
GFR, Est Non African American: 76 mL/min/{1.73_m2} (ref >=60)
Globulin: 2.5 g/dL (calc) (ref 1.9–3.7)
Glucose, Bld: 87 mg/dL (ref 65–99)
Potassium: 4.1 mmol/L (ref 3.5–5.3)
Sodium: 137 mmol/L (ref 135–146)
Total Bilirubin: 0.3 mg/dL (ref 0.2–1.2)
Total Protein: 6.9 g/dL (ref 6.1–8.1)

## 2020-12-12 LAB — LIPID PANEL
Cholesterol: 239 mg/dL — ABNORMAL HIGH (ref <200)
HDL: 60 mg/dL (ref >=50)
LDL Cholesterol (Calc): 143 mg/dL (calc) — ABNORMAL HIGH
Non-HDL Cholesterol (Calc): 179 mg/dL (calc) — ABNORMAL HIGH (ref <130)
Total CHOL/HDL Ratio: 4 (calc) (ref <5.0)
Triglycerides: 226 mg/dL — ABNORMAL HIGH (ref <150)

## 2020-12-12 LAB — TSH: TSH: 1.27 mIU/L (ref 0.40–4.50)

## 2020-12-12 LAB — NO CULTURE INDICATED

## 2020-12-12 LAB — VITAMIN D 25 HYDROXY (VIT D DEFICIENCY, FRACTURES): Vit D, 25-Hydroxy: 38 ng/mL (ref 30–100)

## 2020-12-13 ENCOUNTER — Encounter: Payer: Self-pay | Admitting: Adult Health Nurse Practitioner

## 2020-12-13 ENCOUNTER — Other Ambulatory Visit: Payer: Self-pay | Admitting: Adult Health Nurse Practitioner

## 2020-12-13 DIAGNOSIS — E559 Vitamin D deficiency, unspecified: Secondary | ICD-10-CM

## 2020-12-13 MED ORDER — VITAMIN D (ERGOCALCIFEROL) 1.25 MG (50000 UNIT) PO CAPS: ORAL_CAPSULE | ORAL | 1 refills | Status: DC

## 2020-12-19 NOTE — Progress Notes (Incomplete)
COMPLETE PHYSICAL  Assessment and Plan:  Encounter for general adult medical examination with abnormal findings -     CBC with Diff -     COMPLETE METABOLIC PANEL WITH GFR -     TSH -     Lipid Profile -     Hemoglobin A1c ([facility]) -     Magnesium -     Vitamin D (25 hydroxy) -     Urinalysis, Routine w reflex microscopic -     Microalbumin / Creatinine Urine Ratio -     omeprazole (PRILOSEC) 40 MG capsule; Take 1 tablet PO in morning 30 minutes prior to eating on an empty stomach.  Mixed hyperlipidemia check lipids decrease fatty foods increase activity.  -     TSH -     Lipid Profile  Recurrent major depressive disorder, in partial remission (HCC) Continue lexapro 10 mg  Vitamin D deficiency -     Vitamin D (25 hydroxy)  Allergy, initial encounter Continue OTC meds  Climacteric Follows with Dr. Nori Riis  Abnormal glucose -     Hemoglobin A1c ([facility]) Discussed disease progression and risks Discussed diet/exercise, weight management and risk modification  Screening for blood or protein in urine -     Urinalysis, Routine w reflex microscopic -     Microalbumin / Creatinine Urine Ratio  Gastroesophageal reflux disease without esophagitis Refilled Prilosec 40 mg to pharamcy   Medication management Continued  Discussed med's effects and SE's. Screening labs and tests as requested with regular follow-up as recommended.  HPI  65 y.o. female  presents for a rescheduled complete physical, late due to husband having non covid double pneumonia in Sept. a Her blood pressure has been controlled at home, today their BP is BP: 124/76.  She does workout, since the pandemic she is not attending classes but she is walking/riding bikes. She denies chest pain, shortness of breath, dizziness.  BMI is Body mass index is 27.59 kg/m., she is working on diet and exercise. Wt Readings from Last 3 Encounters:  12/11/20 146 lb (66.2 kg)  10/01/20 145 lb (65.8 kg)  09/10/20 147 lb  1.6 oz (66.7 kg)   She works with [employer], does everything over the phone in the office.   She just started HRT with Dr. Nori Riis 2 weeks ago, she is still on lexapro only on 10mg  a day. She takes xanax 1/2 very rarely per patient.  Had DEXA 11/2019, MGM 03/2019, PAP 07/2019 per patient- normal.   She is not on cholesterol medication and denies myalgias. Her cholesterol is not at goal. The cholesterol last visit was:  Lab Results  Component Value Date   CHOL 271 (H) 03/14/2020   HDL 59 03/14/2020   LDLCALC 160 (H) 03/14/2020   TRIG 340 (H) 03/14/2020   CHOLHDL 4.6 03/14/2020  . She has been working on diet and exercise for prediabetes, she is on bASA, she is not on ACE/ARB and denies foot ulcerations, hyperglycemia, hypoglycemia , increased appetite, nausea, paresthesia of the feet, polydipsia, polyuria, visual disturbances, vomiting and weight loss. Last A1C in the office was:  Lab Results  Component Value Date   HGBA1C 5.5 12/12/2019   Patient is on Vitamin D supplement, vitamin D/calcium combo and vitamin D 2000 IU.    Lab Results  Component Value Date   VD25OH 23 (L) 12/12/2019      Current Medications:  Current Outpatient Medications on File Prior to Visit  Medication Sig Dispense Refill  . ALPRAZolam Duanne Moron)  0.5 MG tablet Take 0.5 mg by mouth 2 (two) times daily. AS NEEDED RARELY TAKES PER PT    . cetirizine (ZYRTEC) 10 MG chewable tablet Chew 10 mg by mouth daily.    Marland Kitchen escitalopram (LEXAPRO) 20 MG tablet Take 20 mg by mouth daily.    Marland Kitchen estradiol (ESTRACE) 2 MG tablet Take 2 mg by mouth daily.    . Magnesium 400 MG TABS Take 400 mg by mouth daily. CURRENLTY 30 MG DAILY    . omeprazole (PRILOSEC) 40 MG capsule Take 40 mg by mouth daily.    Marland Kitchen OVER THE COUNTER MEDICATION COLLAGEN POWDER ONE SCOOP IN COFFEE DAILY    . Probiotic Product (PROBIOTIC DAILY PO) Take by mouth daily.    . progesterone (PROMETRIUM) 100 MG capsule Take 100 mg by mouth daily.    Marland Kitchen zolpidem (AMBIEN) 10  MG tablet Take 10 mg by mouth at bedtime as needed for sleep.     No current facility-administered medications on file prior to visit.    Health Maintenance:   Immunization History  Administered Date(s) Administered  . Janssen (J&J) SARS-COV-2 Vaccination 02/17/2020  . Tdap 02/04/2008, 08/03/2018  . Zoster 12/28/2012   Tetanus: 2019 Influenza ALLERGY to EGGS Zostavax: 2014 suggest new one before 71  Pap:  07/2019, never abnormal, Dr. Gaetano Net MGM: 2020  AT Dr. Gaetano Net DEXA: 2020 Dr. Nori Riis Colonoscopy: 2014 due will get Dr. Earlean Shawl-  2024 Last Dental Exam: Dr. Berdine Addison /2021 Last Eye Exam: Dr. Cammy Copa 2021  Patient Care Team: Unk Pinto, MD as PCP - General (Internal Medicine) Macarthur Critchley, [place] as Referring Physician (Optometry) Richmond Campbell, MD as Consulting Physician (Gastroenterology) Maisie Fus, MD as Consulting Physician (Obstetrics and Gynecology) Fayette Pho, MD as Referring Physician (Orthopedic Surgery) Lavonna Monarch, MD as Consulting Physician (Dermatology)  Medical History:  Past Medical History:  Diagnosis Date  . Allergy   . Anxiety   . 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 BCC (basal cell carcinoma of skin) 10/07/2011   Right Inner Shin  . Depression    CONTROLLED WITH OCCASIONAL XANAX  . Fibrocystic breast disease   . GERD (gastroesophageal reflux disease)   . Hyperlipidemia   . Osteopenia   . Prediabetes 09/05/2014  . Vitamin D deficiency    Allergies Allergies  Allergen Reactions  . Acyclovir And Related   . Eggs Or Egg-Derived Products Hives    Patient states she eats eggs with no problem.  Received propofol with no reaction.   . Neosporin [Neomycin-Bacitracin Zn-Polymyx] Hives  . Red Yeast Rice [Cholestin] Other (See Comments)    MYALGIA with higher doses  . Sulfa Antibiotics Hives  . Zostavax [Zoster Vaccine Live] Rash    SURGICAL HISTORY She  has a past surgical history that includes Dilation  and curettage of uterus (2006); Novasure ablation (2006); Cervix lesion destruction; Skin cancer excision (Right, 10/2011); Carpal tunnel release (Right, 12/06/2015); Hernia repair; Bladder suspension (N/A, 09/10/2020); and Cystoscopy (N/A, 09/10/2020). FAMILY HISTORY Her family history includes Cancer in her paternal uncle; Diabetes in her paternal grandfather; Heart disease in her father and paternal grandfather; Hyperlipidemia in her father and mother; Hypertension in her father and mother; Rheum arthritis in her mother. SOCIAL HISTORY She  reports that she quit smoking about 30 years ago. She has never used smokeless tobacco. She reports current alcohol use. She reports that she does not use drugs.'  Review of Systems: Review of Systems  Constitutional: Negative for chills, fever  and malaise/fatigue.  HENT: Negative for congestion, ear pain and sore throat.   Respiratory: Negative for cough, shortness of breath and wheezing.   Cardiovascular: Negative for chest pain, palpitations and leg swelling.  Gastrointestinal: Negative for abdominal pain, blood in stool, constipation, diarrhea, heartburn and melena.  Genitourinary: Negative.   Skin: Negative.   Neurological: Negative for dizziness, sensory change, loss of consciousness and headaches.  Psychiatric/Behavioral: Negative for depression. The patient is not nervous/anxious and does not have insomnia.     Physical Exam: Estimated body mass index is 27.59 kg/m as calculated from the following:   Height as of this encounter: 5\' 1"  (1.549 m).   Weight as of this encounter: 146 lb (66.2 kg). BP 124/76   Pulse 71   Temp 97.7 F (36.5 C)   Ht 5\' 1"  (1.549 m)   Wt 146 lb (66.2 kg)   SpO2 97%   BMI 27.59 kg/m   General Appearance: Well nourished well developed, in no apparent distress.  Eyes: PERRLA, EOMs, conjunctiva no swelling or erythema ENT/Mouth: Ear canals normal without obstruction, swelling, erythema, or discharge.  TMs normal  bilaterally with no erythema, bulging, retraction, or loss of landmark.  Oropharynx moist and clear with no exudate, erythema, or swelling.   Neck: Supple, thyroid normal. No bruits.  No cervical adenopathy Respiratory: Respiratory effort normal, Breath sounds clear A&P without wheeze, rhonchi, rales.   Cardio: RRR without murmurs, rubs or gallops. Brisk peripheral pulses without edema.  Chest: symmetric, with normal excursions Breasts: defer  Abdomen: Soft, nontender, no guarding, rebound, hernias, masses, or organomegaly.  Lymphatics: Non tender without lymphadenopathy.  Musculoskeletal: Full ROM all peripheral extremities,5/5 strength, and normal gait.  Skin: Warm, dry without rashes, lesions, ecchymosis. Neuro: Awake and oriented X 3, Cranial nerves intact, reflexes equal bilaterally. Normal muscle tone, no cerebellar symptoms. Sensation intact.  Psych:  normal affect, Insight and Judgment appropriate.   EKG: normal 07/2018- FAMILY HISTORY CAD IN 80'S  AORTA SCAN: defer  Over 40 minutes of exam, counseling, chart review and critical decision making was performed  Norma Cameron 2:56 PM [facility]

## 2020-12-20 ENCOUNTER — Encounter: Payer: Self-pay | Admitting: Adult Health Nurse Practitioner

## 2020-12-26 ENCOUNTER — Other Ambulatory Visit: Payer: Self-pay

## 2020-12-26 ENCOUNTER — Ambulatory Visit (INDEPENDENT_AMBULATORY_CARE_PROVIDER_SITE_OTHER): Payer: BC Managed Care – PPO | Admitting: Podiatry

## 2020-12-26 ENCOUNTER — Encounter: Payer: Self-pay | Admitting: Podiatry

## 2020-12-26 DIAGNOSIS — B351 Tinea unguium: Secondary | ICD-10-CM

## 2020-12-26 MED ORDER — TERBINAFINE HCL 250 MG PO TABS: 250.0000 mg | ORAL_TABLET | Freq: Every day | ORAL | 0 refills | Status: DC

## 2020-12-27 NOTE — Progress Notes (Signed)
Subjective:   Patient ID: Norma Cameron, female   DOB: 65 y.o.   MRN: [id number]   HPI Patient has a 6 to 3-month history of thick brittle nailbeds left big toe that she states has been occasionally sore but she is concerned about the discoloration and would like to do anything she can do to try to get it better.  Patient does not smoke likes to be active   Review of Systems  All other systems reviewed and are negative.       Objective:  Physical Exam Vitals and nursing note reviewed.  Constitutional:      Appearance: She is well-developed and well-nourished.  Cardiovascular:     Pulses: Intact distal pulses.  Pulmonary:     Effort: Pulmonary effort is normal.  Musculoskeletal:        General: Normal range of motion.  Skin:    General: Skin is warm.  Neurological:     Mental Status: She is alert.     Neurovascular status intact muscle strength found to be adequate range of motion adequate.  Patient is noted to have a thickened brittle nailbeds distal two thirds left that is localized there is no drainage associated with that it is mildly loose and mildly painful     Assessment:  Probability of a combination of mycotic nail along with probable trauma to the nailbed with components of both     Plan:  H&P condition discussed at great length.  We discussed different treatment options and at this point we are going to try relative conservative treatment and I have recommended 45 days of oral Lamisil 1/day along with laser therapy.  I did explain there is no guarantee this will solve the problem and ultimately she could lose the nail and I educated her on this and answered all questions for her.  Patient is encouraged to call with questions which may come up

## 2021-01-07 ENCOUNTER — Other Ambulatory Visit: Payer: Self-pay

## 2021-01-07 ENCOUNTER — Other Ambulatory Visit: Payer: Self-pay | Admitting: Internal Medicine

## 2021-01-07 DIAGNOSIS — K219 Gastro-esophageal reflux disease without esophagitis: Secondary | ICD-10-CM

## 2021-01-07 MED ORDER — OMEPRAZOLE 40 MG PO CPDR
40.0000 mg | DELAYED_RELEASE_CAPSULE | Freq: Every day | ORAL | 3 refills | Status: DC
Start: 2021-01-07 — End: 2021-01-07

## 2021-01-07 MED ORDER — OMEPRAZOLE 40 MG PO CPDR: DELAYED_RELEASE_CAPSULE | ORAL | 0 refills | Status: DC

## 2021-01-11 ENCOUNTER — Other Ambulatory Visit: Payer: Self-pay

## 2021-01-11 ENCOUNTER — Ambulatory Visit (INDEPENDENT_AMBULATORY_CARE_PROVIDER_SITE_OTHER): Payer: BC Managed Care – PPO | Admitting: *Deleted

## 2021-01-11 DIAGNOSIS — B351 Tinea unguium: Secondary | ICD-10-CM

## 2021-01-11 NOTE — Progress Notes (Signed)
Patient presents today for the 1st laser treatment. Diagnosed with mycotic nail infection by Dr. Paulla Dolly.   Toenail most affected hallux nail .  All other systems are negative.  Nails were filed thin. Laser therapy was administered to 1st toenails left and patient tolerated the treatment well. All safety precautions were in place.   Patient is also taking terbinafine daily x 45 days.   Follow up in 6 weeks for laser # 2.  Picture of nails taken today to document visual progress  ~May only need 3-4 treatment, will evaluate per visit~

## 2021-01-11 NOTE — Patient Instructions (Signed)

## 2021-02-22 ENCOUNTER — Ambulatory Visit (INDEPENDENT_AMBULATORY_CARE_PROVIDER_SITE_OTHER): Payer: Self-pay

## 2021-02-22 ENCOUNTER — Other Ambulatory Visit: Payer: Self-pay

## 2021-02-22 DIAGNOSIS — B351 Tinea unguium: Secondary | ICD-10-CM

## 2021-02-22 NOTE — Progress Notes (Signed)
Patient presents today for the 2nd laser treatment. Diagnosed with mycotic nail infection by Dr. Paulla Dolly.   Toenail most affected hallux nail .  All other systems are negative.  Nails were filed thin. Laser therapy was administered to 1st toenails left and patient tolerated the treatment well. All safety precautions were in place.   Patient is also taking terbinafine daily x 45 days.   Follow up in 6 weeks for laser # 3.  ~May only need 3-4 treatment, will evaluate per visit~

## 2021-04-05 ENCOUNTER — Ambulatory Visit (INDEPENDENT_AMBULATORY_CARE_PROVIDER_SITE_OTHER): Payer: BC Managed Care – PPO

## 2021-04-05 ENCOUNTER — Other Ambulatory Visit: Payer: Self-pay

## 2021-04-05 DIAGNOSIS — B351 Tinea unguium: Secondary | ICD-10-CM

## 2021-04-05 NOTE — Progress Notes (Signed)
Patient presents today for the 3rd laser treatment. Diagnosed with mycotic nail infection by Dr. Paulla Dolly.   Toenail most affected hallux nail .  All other systems are negative.  Nails were filed thin. Laser therapy was administered to 1st toenails left and patient tolerated the treatment well. All safety precautions were in place.   Patient is also taking terbinafine daily x 45 days.   Follow up in 6 weeks for laser # 4.  ~May only need 3-4 treatment, will evaluate per visit~

## 2021-05-08 DIAGNOSIS — Z1231 Encounter for screening mammogram for malignant neoplasm of breast: Secondary | ICD-10-CM | POA: Diagnosis not present

## 2021-05-17 ENCOUNTER — Ambulatory Visit (INDEPENDENT_AMBULATORY_CARE_PROVIDER_SITE_OTHER): Payer: BC Managed Care – PPO

## 2021-05-17 ENCOUNTER — Other Ambulatory Visit: Payer: Self-pay | Admitting: Internal Medicine

## 2021-05-17 ENCOUNTER — Other Ambulatory Visit: Payer: Self-pay

## 2021-05-17 DIAGNOSIS — K219 Gastro-esophageal reflux disease without esophagitis: Secondary | ICD-10-CM

## 2021-05-17 DIAGNOSIS — B351 Tinea unguium: Secondary | ICD-10-CM

## 2021-05-17 NOTE — Progress Notes (Signed)
Patient presents today for the 4th laser treatment. Diagnosed with mycotic nail infection by Dr. Paulla Dolly.   Toenail most affected hallux nail .  All other systems are negative.  Nails were filed thin. Laser therapy was administered to 1st toenails left and patient tolerated the treatment well. All safety precautions were in place.   Patient is also taking terbinafine daily x 45 days.   Follow up in 6 weeks for laser # 5.

## 2021-06-28 ENCOUNTER — Other Ambulatory Visit: Payer: BC Managed Care – PPO

## 2021-06-28 ENCOUNTER — Ambulatory Visit (INDEPENDENT_AMBULATORY_CARE_PROVIDER_SITE_OTHER): Payer: BC Managed Care – PPO | Admitting: *Deleted

## 2021-06-28 ENCOUNTER — Other Ambulatory Visit: Payer: Self-pay

## 2021-06-28 DIAGNOSIS — B351 Tinea unguium: Secondary | ICD-10-CM

## 2021-06-28 NOTE — Progress Notes (Signed)
Patient presents today for the 5th laser treatment. Diagnosed with mycotic nail infection by Dr. Paulla Dolly.   Toenail most affected hallux nail .  All other systems are negative.  Nails were filed thin. Laser therapy was administered to 1st toenails left and patient tolerated the treatment well. All safety precautions were in place.   Patient only took 1 month of terbinafine.   Follow up in 8 weeks for laser # 6th and final laser.

## 2021-07-25 DIAGNOSIS — Z01419 Encounter for gynecological examination (general) (routine) without abnormal findings: Secondary | ICD-10-CM | POA: Diagnosis not present

## 2021-07-25 DIAGNOSIS — Z6824 Body mass index (BMI) 24.0-24.9, adult: Secondary | ICD-10-CM | POA: Diagnosis not present

## 2021-08-23 ENCOUNTER — Other Ambulatory Visit: Payer: Self-pay

## 2021-08-23 ENCOUNTER — Ambulatory Visit (INDEPENDENT_AMBULATORY_CARE_PROVIDER_SITE_OTHER): Payer: BC Managed Care – PPO

## 2021-08-23 DIAGNOSIS — B351 Tinea unguium: Secondary | ICD-10-CM

## 2021-08-23 NOTE — Progress Notes (Signed)
Patient presents today for the 5th laser treatment. Diagnosed with mycotic nail infection by Dr. Paulla Dolly.   Toenail most affected hallux nail .  All other systems are negative.  Nails were filed thin. Laser therapy was administered to 1st toenails left and patient tolerated the treatment well. All safety precautions were in place.   Patient only took 1 month of terbinafine.   Patient has completed the recommended laser treatments. He will follow up with Dr. Paulla Dolly in 3 months to evaluate progress.

## 2021-08-23 NOTE — Patient Instructions (Signed)

## 2021-11-18 ENCOUNTER — Encounter: Payer: Self-pay | Admitting: Podiatry

## 2021-11-18 ENCOUNTER — Other Ambulatory Visit: Payer: Self-pay

## 2021-11-18 ENCOUNTER — Ambulatory Visit (INDEPENDENT_AMBULATORY_CARE_PROVIDER_SITE_OTHER): Payer: BC Managed Care – PPO | Admitting: Podiatry

## 2021-11-18 DIAGNOSIS — L600 Ingrowing nail: Secondary | ICD-10-CM

## 2021-11-18 DIAGNOSIS — B351 Tinea unguium: Secondary | ICD-10-CM | POA: Diagnosis not present

## 2021-11-18 NOTE — Progress Notes (Signed)
Subjective:   Patient ID: Norma Cameron, female   DOB: 65 y.o.   MRN: [id number]   HPI Patient presents stating that her nail is still discolored and bothersome and she is not sure what the next option should be   ROS      Objective:  Physical Exam  Neurovascular status intact negative Bevelyn Buckles' sign noted range of motion good muscle strength adequate digital perfusion good with discolored incurvated thickened left hallux nail bed involving approximate three quarters of the nail with adjacent nails doing well with no indication of infection     Assessment:  Probability for strong trauma condition to the left nail with also fungal element left big toenail     Plan:  H&P reviewed condition and I do think that nail removal could be considered and I reviewed at great length nail removal with permanent procedure versus allowing it to continue to grow this was with the possibility for removal of an ingrown toenail if 1 should develop.  We reviewed pros and cons of treatment options and at this point she is just can watch it and decide long-term what would be best.  Encouraged her to call with questions concerns

## 2021-12-10 NOTE — Progress Notes (Signed)
COMPLETE PHYSICAL  Assessment and Plan:  Encounter for general adult medical examination with abnormal findings -     CBC with Diff -     COMPLETE METABOLIC PANEL WITH GFR -     TSH -     Lipid Profile -     Hemoglobin A1c ([facility]) -     Magnesium -     Vitamin D (25 hydroxy) -     Urinalysis, Routine w reflex microscopic -     Microalbumin / Creatinine Urine Ratio CANNOT ACCESS MYCHART_- CALL WITH RESULTS  Mixed hyperlipidemia check lipids decrease fatty foods increase activity.  -     TSH -     Lipid Profile  Recurrent major depressive disorder, in partial remission (HCC)/Anxiety Continue lexapro 10 mg Xanax 0.5mg  twice a day as needed   Vitamin D deficiency -     Vitamin D (25 hydroxy)  Climacteric Follows with Dr. Nori Riis Continue Estrace and Prometrium  Abnormal glucose -     Hemoglobin A1c ([facility]) Discussed disease progression and risks Discussed diet/exercise, weight management and risk modification  Screening for blood or protein in urine -     Urinalysis, Routine w reflex microscopic -     Microalbumin / Creatinine Urine Ratio  Screening for ischemic heart disease EKG  Gastroesophageal reflux disease without esophagitis Refilled Prilosec 40 mg to pharamcy Continue dietary modifications  Medication management Magnesium  Eustachian tube dysfunction , right Use Mucinex q 8 hours as needed   Future Appointments  Date Time Provider [place]  12/11/2022  2:00 PM Magda Bernheim, NP [facility] None    Discussed med's effects and SE's. Screening labs and tests as requested with regular follow-up as recommended.  HPI  66 y.o. female  presents for a complete physical.   She has noticed a fullness in her ears, not a pain but notices heart beating in ears  Her blood pressure has been controlled at home, today their BP is BP: 112/60.   BP Readings from Last 3 Encounters:  12/11/21 112/60  12/11/20 124/76  10/01/20 136/82    She does workout,  since the pandemic she is not attending classes but she is walking/riding bikes. She denies chest pain, shortness of breath, dizziness.   BMI is Body mass index is 26.79 kg/m., she is working on diet and exercise. Wt Readings from Last 3 Encounters:  12/11/21 141 lb 12.8 oz (64.3 kg)  12/11/20 146 lb (66.2 kg)  10/01/20 145 lb (65.8 kg)   She works with [employer], has returned to seeing clients in the office as well as on phone.   She is taking Estrace/ Prometrium for HRT with Dr. Nori Riis.    She is having increased social stress currently.  Father passed away after Thanksgiving and daughter-in-law has thyroid cancer. She is still on lexapro only on 10mg  a day. She has needed to use her Xanax more frequently due to stress. Had DEXA 11/2020, MGM 03/2021, PAP 07/2021 per patient- normal.   She is not on cholesterol medication and denies myalgias. Her cholesterol is not at goal. The cholesterol last visit was:  Lab Results  Component Value Date   CHOL 239 (H) 12/11/2020   HDL 60 12/11/2020   LDLCALC 143 (H) 12/11/2020   TRIG 226 (H) 12/11/2020   CHOLHDL 4.0 12/11/2020  . She has been working on diet and exercise for prediabetes, she is on bASA, she is not on ACE/ARB and denies foot ulcerations, hyperglycemia, hypoglycemia , increased appetite,  nausea, paresthesia of the feet, polydipsia, polyuria, visual disturbances, vomiting and weight loss. Last A1C in the office was:  Lab Results  Component Value Date   HGBA1C 5.4 12/11/2020   Patient is on Vitamin D supplement, vitamin D/calcium combo and vitamin D 2000 IU.    Lab Results  Component Value Date   VD25OH 38 12/11/2020     She does take 1/2 Ambien at night to sleep.   Current Medications:  Current Outpatient Medications on File Prior to Visit  Medication Sig Dispense Refill   ALPRAZolam (XANAX) 0.5 MG tablet Take 0.5 mg by mouth 2 (two) times daily. AS NEEDED RARELY TAKES PER PT     cetirizine (ZYRTEC) 10 MG chewable tablet  Chew 10 mg by mouth daily.     escitalopram (LEXAPRO) 20 MG tablet Take 20 mg by mouth daily.     estradiol (ESTRACE) 2 MG tablet Take 2 mg by mouth daily.     Magnesium 400 MG TABS Take 400 mg by mouth daily. CURRENLTY 30 MG DAILY     omeprazole (PRILOSEC) 40 MG capsule TAKE 1 CAPSULE DAILY TO PREVENT HEARTBURN & INDIGESTION 90 capsule 3   OVER THE COUNTER MEDICATION COLLAGEN POWDER ONE SCOOP IN COFFEE DAILY     Probiotic Product (PROBIOTIC DAILY PO) Take by mouth daily.     progesterone (PROMETRIUM) 100 MG capsule Take 100 mg by mouth daily.     Vitamin D, Ergocalciferol, (DRISDOL) 1.25 MG (50000 UNIT) CAPS capsule 1 pill 3 days a week for vitamin d deficiency 36 capsule 1   zolpidem (AMBIEN) 10 MG tablet Take 10 mg by mouth at bedtime as needed for sleep.     meloxicam (MOBIC) 15 MG tablet Take one daily with food for 2 weeks, can take with tylenol, can not take with aleve, iburpofen, then as needed daily for pain (Patient not taking: Reported on 12/11/2021)     terbinafine (LAMISIL) 250 MG tablet Take 1 tablet (250 mg total) by mouth daily. (Patient not taking: Reported on 12/11/2021) 45 tablet 0   No current facility-administered medications on file prior to visit.    Health Maintenance:   Immunization History  Administered Date(s) Administered   Janssen (J&J) SARS-COV-2 Vaccination 02/17/2020   Tdap 02/04/2008, 08/03/2018   Zoster, Live 12/28/2012   Tetanus: 2019 Influenza ALLERGY to EGGS Zostavax: 2014 suggest new one before 70  Pap:  07/2019, never abnormal, Dr. Gaetano Net MGM: 2022  AT Dr. Gaetano Net DEXA: 2021 Dr. Nori Riis Colonoscopy: 2014 due will get Dr. Earlean Shawl-  2024 Last Dental Exam: Dr. Berdine Addison 2022 Last Eye Exam: Dr. Nicki Reaper, 2022  Patient Care Team: Unk Pinto, MD as PCP - General (Internal Medicine) Macarthur Critchley, [place] as Referring Physician (Optometry) Richmond Campbell, MD as Consulting Physician (Gastroenterology) Maisie Fus, MD as Consulting Physician (Obstetrics and  Gynecology) Fayette Pho, MD as Referring Physician (Orthopedic Surgery) Lavonna Monarch, MD as Consulting Physician (Dermatology)  Medical History:  Past Medical History:  Diagnosis Date   Allergy    Anxiety    Atypical nevus 04/13/2002   Left Chest-Slight to Moderate edges free   BCC (basal cell carcinoma of skin) 10/07/2011   Right Inner Shin   Depression    CONTROLLED WITH OCCASIONAL XANAX   Fibrocystic breast disease    GERD (gastroesophageal reflux disease)    Hyperlipidemia    Osteopenia    Prediabetes 09/05/2014   Vitamin D deficiency    Allergies Allergies  Allergen Reactions   Acyclovir And Related  Eggs Or Egg-Derived Products Hives    Patient states she eats eggs with no problem.  Received propofol with no reaction.    Monascus Purpureus Went Yeast Other (See Comments)    MYALGIA with higher doses   Neosporin [Neomycin-Bacitracin Zn-Polymyx] Hives   Penicillins Other (See Comments)   Red Yeast Rice [Cholestin] Other (See Comments)    MYALGIA with higher doses   Sulfa Antibiotics Hives   Zostavax [Zoster Vaccine Live] Rash    SURGICAL HISTORY She  has a past surgical history that includes Dilation and curettage of uterus (2006); Novasure ablation (2006); Cervix lesion destruction; Skin cancer excision (Right, 10/2011); Carpal tunnel release (Right, 12/06/2015); Hernia repair; Bladder suspension (N/A, 09/10/2020); and Cystoscopy (N/A, 09/10/2020). FAMILY HISTORY Her family history includes Cancer in her paternal uncle; Diabetes in her paternal grandfather; Heart disease in her father and paternal grandfather; Hyperlipidemia in her father and mother; Hypertension in her father and mother; Rheum arthritis in her mother. SOCIAL HISTORY She  reports that she quit smoking about 31 years ago. Her smoking use included cigarettes. She has never used smokeless tobacco. She reports current alcohol use. She reports that she does not use drugs.'  Review of Systems: Review  of Systems  Constitutional:  Negative for chills, fever and malaise/fatigue.  HENT:  Positive for ear pain (fullness). Negative for congestion and sore throat.   Respiratory:  Negative for cough, shortness of breath and wheezing.   Cardiovascular:  Negative for chest pain, palpitations and leg swelling.  Gastrointestinal:  Negative for abdominal pain, blood in stool, constipation, diarrhea, heartburn, melena, nausea and vomiting.  Genitourinary: Negative.  Negative for dysuria.  Musculoskeletal:  Negative for back pain and joint pain.  Skin: Negative.   Neurological:  Negative for dizziness, sensory change, loss of consciousness and headaches.  Psychiatric/Behavioral:  Negative for depression. The patient is nervous/anxious. The patient does not have insomnia.    Physical Exam: Estimated body mass index is 26.79 kg/m as calculated from the following:   Height as of this encounter: 5\' 1"  (1.549 m).   Weight as of this encounter: 141 lb 12.8 oz (64.3 kg). BP 112/60    Pulse 66    Temp (!) 97.5 F (36.4 C)    Ht 5\' 1"  (1.549 m)    Wt 141 lb 12.8 oz (64.3 kg)    SpO2 99%    BMI 26.79 kg/m   General Appearance: Well nourished well developed, in no apparent distress.  Eyes: PERRLA, EOMs, conjunctiva no swelling or erythema ENT/Mouth: Ear canals normal without obstruction, swelling, erythema, or discharge.  L TM normal bilaterally with no erythema, bulging, retraction, or loss of landmark. R TM dull with fluid Oropharynx moist and clear with no exudate, erythema, or swelling.   Neck: Supple, thyroid normal. No bruits.  No cervical adenopathy Respiratory: Respiratory effort normal, Breath sounds clear A&P without wheeze, rhonchi, rales.   Cardio: RRR without murmurs, rubs or gallops. Brisk peripheral pulses without edema.  Chest: symmetric, with normal excursions Breasts: defer  Abdomen: Soft, nontender, no guarding, rebound, hernias, masses, or organomegaly.  Lymphatics: Non tender without  lymphadenopathy.  Musculoskeletal: Full ROM all peripheral extremities,5/5 strength, and normal gait.  Skin: Warm, dry without rashes, lesions, ecchymosis. Neuro: Awake and oriented X 3, Cranial nerves intact, reflexes equal bilaterally. Normal muscle tone, no cerebellar symptoms. Sensation intact.  Psych:  normal affect, Insight and Judgment appropriate.   EKG: NSR- NO ST Changes FAMILY HISTORY CAD IN 16'S    Candus Braud W  Tae Robak, NP 2:00 PM [facility]

## 2021-12-11 ENCOUNTER — Ambulatory Visit (INDEPENDENT_AMBULATORY_CARE_PROVIDER_SITE_OTHER): Payer: BC Managed Care – PPO | Admitting: Nurse Practitioner

## 2021-12-11 ENCOUNTER — Encounter: Payer: Self-pay | Admitting: Nurse Practitioner

## 2021-12-11 ENCOUNTER — Other Ambulatory Visit: Payer: Self-pay

## 2021-12-11 VITALS — BP 112/60 | HR 66 | Temp 97.5°F | Ht 61.0 in | Wt 141.8 lb

## 2021-12-11 DIAGNOSIS — N951 Menopausal and female climacteric states: Secondary | ICD-10-CM

## 2021-12-11 DIAGNOSIS — I10 Essential (primary) hypertension: Secondary | ICD-10-CM | POA: Diagnosis not present

## 2021-12-11 DIAGNOSIS — E559 Vitamin D deficiency, unspecified: Secondary | ICD-10-CM

## 2021-12-11 DIAGNOSIS — Z0000 Encounter for general adult medical examination without abnormal findings: Secondary | ICD-10-CM

## 2021-12-11 DIAGNOSIS — E782 Mixed hyperlipidemia: Secondary | ICD-10-CM

## 2021-12-11 DIAGNOSIS — K219 Gastro-esophageal reflux disease without esophagitis: Secondary | ICD-10-CM

## 2021-12-11 DIAGNOSIS — Z1389 Encounter for screening for other disorder: Secondary | ICD-10-CM

## 2021-12-11 DIAGNOSIS — H6981 Other specified disorders of Eustachian tube, right ear: Secondary | ICD-10-CM

## 2021-12-11 DIAGNOSIS — F3341 Major depressive disorder, recurrent, in partial remission: Secondary | ICD-10-CM

## 2021-12-11 DIAGNOSIS — Z13220 Encounter for screening for lipoid disorders: Secondary | ICD-10-CM

## 2021-12-11 DIAGNOSIS — R7309 Other abnormal glucose: Secondary | ICD-10-CM

## 2021-12-11 DIAGNOSIS — Z131 Encounter for screening for diabetes mellitus: Secondary | ICD-10-CM | POA: Diagnosis not present

## 2021-12-11 DIAGNOSIS — Z136 Encounter for screening for cardiovascular disorders: Secondary | ICD-10-CM | POA: Diagnosis not present

## 2021-12-11 DIAGNOSIS — Z79899 Other long term (current) drug therapy: Secondary | ICD-10-CM

## 2021-12-11 DIAGNOSIS — F419 Anxiety disorder, unspecified: Secondary | ICD-10-CM

## 2021-12-11 DIAGNOSIS — Z0001 Encounter for general adult medical examination with abnormal findings: Secondary | ICD-10-CM

## 2021-12-11 MED ORDER — ALPRAZOLAM 0.5 MG PO TABS: 0.5000 mg | ORAL_TABLET | Freq: Two times a day (BID) | ORAL | 0 refills | Status: AC

## 2021-12-11 NOTE — Patient Instructions (Signed)
Managing Anxiety, Adult ?After being diagnosed with anxiety, you may be relieved to know why you have felt or behaved a certain way. You may also feel overwhelmed about the treatment ahead and what it will mean for your life. With care and support, you can manage this condition. ?How to manage lifestyle changes ?Managing stress and anxiety ?Stress is your body's reaction to life changes and events, both good and bad. Most stress will last just a few hours, but stress can be ongoing and can lead to more than just stress. Although stress can play a major role in anxiety, it is not the same as anxiety. Stress is usually caused by something external, such as a deadline, test, or competition. Stress normally passes after the triggering event has ended.  ?Anxiety is caused by something internal, such as imagining a terrible outcome or worrying that something will go wrong that will devastate you. Anxiety often does not go away even after the triggering event is over, and it can become long-term (chronic) worry. It is important to understand the differences between stress and anxiety and to manage your stress effectively so that it does not lead to an anxious response. ?Talk with your health care provider or a counselor to learn more about reducing anxiety and stress. He or she may suggest tension reduction techniques, such as: ?Music therapy. Spend time creating or listening to music that you enjoy and that inspires you. ?Mindfulness-based meditation. Practice being aware of your normal breaths while not trying to control your breathing. It can be done while sitting or walking. ?Centering prayer. This involves focusing on a word, phrase, or sacred image that means something to you and brings you peace. ?Deep breathing. To do this, expand your stomach and inhale slowly through your nose. Hold your breath for 3-5 seconds. Then exhale slowly, letting your stomach muscles relax. ?Self-talk. Learn to notice and identify  thought patterns that lead to anxiety reactions and change those patterns to thoughts that feel peaceful. ?Muscle relaxation. Taking time to tense muscles and then relax them. ?Choose a tension reduction technique that fits your lifestyle and personality. These techniques take time and practice. Set aside 5-15 minutes a day to do them. Therapists can offer counseling and training in these techniques. The training to help with anxiety may be covered by some insurance plans. ?Other things you can do to manage stress and anxiety include: ?Keeping a stress diary. This can help you learn what triggers your reaction and then learn ways to manage your response. ?Thinking about how you react to certain situations. You may not be able to control everything, but you can control your response. ?Making time for activities that help you relax and not feeling guilty about spending your time in this way. ?Doing visual imagery. This involves imagining or creating mental pictures to help you relax. ?Practicing yoga. Through yoga poses, you can lower tension and promote relaxation. ? ?Medicines ?Medicines can help ease symptoms. Medicines for anxiety include: ?Antidepressant medicines. These are usually prescribed for long-term daily control. ?Anti-anxiety medicines. These may be added in severe cases, especially when panic attacks occur. ?Medicines will be prescribed by a health care provider. When used together, medicines, psychotherapy, and tension reduction techniques may be the most effective treatment. ?Relationships ?Relationships can play a big part in helping you recover. Try to spend more time connecting with trusted friends and family members. ?Consider going to couples counseling if you have a partner, taking family education classes, or going to family   therapy. ?Therapy can help you and others better understand your condition. ?How to recognize changes in your anxiety ?Everyone responds differently to treatment for  anxiety. Recovery from anxiety happens when symptoms decrease and stop interfering with your daily activities at home or work. This may mean that you will start to: ?Have better concentration and focus. Worry will interfere less in your daily thinking. ?Sleep better. ?Be less irritable. ?Have more energy. ?Have improved memory. ?It is also important to recognize when your condition is getting worse. Contact your health care provider if your symptoms interfere with home or work and you feel like your condition is not improving. ?Follow these instructions at home: ?Activity ?Exercise. Adults should do the following: ?Exercise for at least 150 minutes each week. The exercise should increase your heart rate and make you sweat (moderate-intensity exercise). ?Strengthening exercises at least twice a week. ?Get the right amount and quality of sleep. Most adults need 7-9 hours of sleep each night. ?Lifestyle ? ?Eat a healthy diet that includes plenty of vegetables, fruits, whole grains, low-fat dairy products, and lean protein. ?Do not eat a lot of foods that are high in fats, added sugars, or salt (sodium). ?Make choices that simplify your life. ?Do not use any products that contain nicotine or tobacco. These products include cigarettes, chewing tobacco, and vaping devices, such as e-cigarettes. If you need help quitting, ask your health care provider. ?Avoid caffeine, alcohol, and certain over-the-counter cold medicines. These may make you feel worse. Ask your pharmacist which medicines to avoid. ?General instructions ?Take over-the-counter and prescription medicines only as told by your health care provider. ?Keep all follow-up visits. This is important. ?Where to find support ?You can get help and support from these sources: ?Self-help groups. ?Online and community organizations. ?A trusted spiritual leader. ?Couples counseling. ?Family education classes. ?Family therapy. ?Where to find more information ?You may find  that joining a support group helps you deal with your anxiety. The following sources can help you locate counselors or support groups near you: ?Mental Health America: www.mentalhealthamerica.net ?Anxiety and Depression Association of America (ADAA): www.adaa.org ?National Alliance on Mental Illness (NAMI): www.nami.org ?Contact a health care provider if: ?You have a hard time staying focused or finishing daily tasks. ?You spend many hours a day feeling worried about everyday life. ?You become exhausted by worry. ?You start to have headaches or frequently feel tense. ?You develop chronic nausea or diarrhea. ?Get help right away if: ?You have a racing heart and shortness of breath. ?You have thoughts of hurting yourself or others. ?If you ever feel like you may hurt yourself or others, or have thoughts about taking your own life, get help right away. Go to your nearest emergency department or: ?Call your local emergency services (911 in the [place] ?Call a suicide crisis helpline, such as the National Suicide Prevention Lifeline at 1-800-273-8255 or 988 in the [place] This is open 24 hours a day in the [place] ?Text the Crisis Text Line at 741741 (in the [place] ?Summary ?Taking steps to learn and use tension reduction techniques can help calm you and help prevent triggering an anxiety reaction. ?When used together, medicines, psychotherapy, and tension reduction techniques may be the most effective treatment. ?Family, friends, and partners can play a big part in supporting you. ?This information is not intended to replace advice given to you by your health care provider. Make sure you discuss any questions you have with your health care provider. ?Document Revised: 06/19/2021 Document Reviewed: 03/17/2021 ?Elsevier Patient   Education ? 2022 Elsevier Inc. ? ?

## 2021-12-12 ENCOUNTER — Other Ambulatory Visit: Payer: Self-pay | Admitting: Nurse Practitioner

## 2021-12-12 DIAGNOSIS — E782 Mixed hyperlipidemia: Secondary | ICD-10-CM

## 2021-12-12 LAB — MICROALBUMIN / CREATININE URINE RATIO
Creatinine, Urine: 68 mg/dL (ref 20–275)
Microalb, Ur: <0.2 mg/dL

## 2021-12-12 LAB — CBC WITH DIFFERENTIAL/PLATELET
Absolute Monocytes: 587 cells/uL (ref 200–950)
Basophils Absolute: 62 cells/uL (ref 0–200)
Basophils Relative: 0.7 %
Eosinophils Absolute: 107 cells/uL (ref 15–500)
Eosinophils Relative: 1.2 %
HCT: 38.6 % (ref 35.0–45.0)
Hemoglobin: 13 g/dL (ref 11.7–15.5)
Lymphs Abs: 3177 cells/uL (ref 850–3900)
MCH: 33 pg (ref 27.0–33.0)
MCHC: 33.7 g/dL (ref 32.0–36.0)
MCV: 98 fL (ref 80.0–100.0)
MPV: 11.1 fL (ref 7.5–12.5)
Monocytes Relative: 6.6 %
Neutro Abs: 4966 cells/uL (ref 1500–7800)
Neutrophils Relative %: 55.8 %
Platelets: 304 10*3/uL (ref 140–400)
RBC: 3.94 10*6/uL (ref 3.80–5.10)
RDW: 12.3 % (ref 11.0–15.0)
Total Lymphocyte: 35.7 %
WBC: 8.9 10*3/uL (ref 3.8–10.8)

## 2021-12-12 LAB — HEMOGLOBIN A1C
Hgb A1c MFr Bld: 5.6 % of total Hgb (ref <5.7)
Mean Plasma Glucose: 114 mg/dL
eAG (mmol/L): 6.3 mmol/L

## 2021-12-12 LAB — COMPLETE METABOLIC PANEL WITH GFR
AG Ratio: 1.5 (calc) (ref 1.0–2.5)
ALT: 16 U/L (ref 6–29)
AST: 17 U/L (ref 10–35)
Albumin: 4 g/dL (ref 3.6–5.1)
Alkaline phosphatase (APISO): 39 U/L (ref 37–153)
BUN: 14 mg/dL (ref 7–25)
CO2: 30 mmol/L (ref 20–32)
Calcium: 9.3 mg/dL (ref 8.6–10.4)
Chloride: 101 mmol/L (ref 98–110)
Creat: 1.04 mg/dL (ref 0.50–1.05)
Globulin: 2.7 g/dL (calc) (ref 1.9–3.7)
Glucose, Bld: 99 mg/dL (ref 65–139)
Potassium: 4.2 mmol/L (ref 3.5–5.3)
Sodium: 139 mmol/L (ref 135–146)
Total Bilirubin: 0.4 mg/dL (ref 0.2–1.2)
Total Protein: 6.7 g/dL (ref 6.1–8.1)
eGFR: 60 mL/min/{1.73_m2} (ref >=60)

## 2021-12-12 LAB — URINALYSIS, ROUTINE W REFLEX MICROSCOPIC
Bilirubin Urine: NEGATIVE
Glucose, UA: NEGATIVE
Hgb urine dipstick: NEGATIVE
Ketones, ur: NEGATIVE
Leukocytes,Ua: NEGATIVE
Nitrite: NEGATIVE
Protein, ur: NEGATIVE
Specific Gravity, Urine: 1.013 (ref 1.001–1.035)
pH: 6.5 (ref 5.0–8.0)

## 2021-12-12 LAB — LIPID PANEL
Cholesterol: 265 mg/dL — ABNORMAL HIGH (ref <200)
HDL: 67 mg/dL (ref >=50)
LDL Cholesterol (Calc): 166 mg/dL (calc) — ABNORMAL HIGH
Non-HDL Cholesterol (Calc): 198 mg/dL (calc) — ABNORMAL HIGH (ref <130)
Total CHOL/HDL Ratio: 4 (calc) (ref <5.0)
Triglycerides: 172 mg/dL — ABNORMAL HIGH (ref <150)

## 2021-12-12 LAB — TSH: TSH: 1.01 mIU/L (ref 0.40–4.50)

## 2021-12-12 LAB — MAGNESIUM: Magnesium: 2.1 mg/dL (ref 1.5–2.5)

## 2021-12-12 LAB — VITAMIN D 25 HYDROXY (VIT D DEFICIENCY, FRACTURES): Vit D, 25-Hydroxy: 49 ng/mL (ref 30–100)

## 2021-12-12 MED ORDER — ROSUVASTATIN CALCIUM 5 MG PO TABS: 5.0000 mg | ORAL_TABLET | Freq: Every day | ORAL | 11 refills | Status: DC

## 2022-02-17 NOTE — Progress Notes (Unsigned)
Assessment and Plan:  There are no diagnoses linked to this encounter.    Further disposition pending results of labs. Discussed med's effects and SE's.   Over 30 minutes of exam, counseling, chart review, and critical decision making was performed.   Future Appointments  Date Time Provider [place]  02/18/2022  1:45 PM Magda Bernheim, NP [facility] None  06/11/2022  2:30 PM Magda Bernheim, NP [facility] None  12/11/2022  2:00 PM Magda Bernheim, NP [facility] None    ------------------------------------------------------------------------------------------------------------------   HPI There were no vitals taken for this visit. 66 y.o.female presents for  Past Medical History:  Diagnosis Date   Allergy    Anxiety    Atypical nevus 04/13/2002   Left Chest-Slight to Moderate edges free   BCC (basal cell carcinoma of skin) 10/07/2011   Right Inner Shin   Depression    CONTROLLED WITH OCCASIONAL XANAX   Fibrocystic breast disease    GERD (gastroesophageal reflux disease)    Hyperlipidemia    Osteopenia    Prediabetes 09/05/2014   Vitamin D deficiency      Allergies  Allergen Reactions   Acyclovir And Related    Eggs Or Egg-Derived Products Hives    Patient states she eats eggs with no problem.  Received propofol with no reaction.    Monascus Purpureus Went Yeast Other (See Comments)    MYALGIA with higher doses   Neosporin [Neomycin-Bacitracin Zn-Polymyx] Hives   Penicillins Other (See Comments)   Red Yeast Rice [Cholestin] Other (See Comments)    MYALGIA with higher doses   Sulfa Antibiotics Hives   Zostavax [Zoster Vaccine Live] Rash    Current Outpatient Medications on File Prior to Visit  Medication Sig   ALPRAZolam (XANAX) 0.5 MG tablet Take 1 tablet (0.5 mg total) by mouth 2 (two) times daily. AS NEEDED RARELY TAKES PER PT   cetirizine (ZYRTEC) 10 MG chewable tablet Chew 10 mg by mouth daily.   escitalopram (LEXAPRO) 20 MG tablet Take 20 mg by mouth daily.    estradiol (ESTRACE) 2 MG tablet Take 2 mg by mouth daily.   Magnesium 400 MG TABS Take 400 mg by mouth daily. CURRENLTY 30 MG DAILY   omeprazole (PRILOSEC) 40 MG capsule TAKE 1 CAPSULE DAILY TO PREVENT HEARTBURN & INDIGESTION   OVER THE COUNTER MEDICATION COLLAGEN POWDER ONE SCOOP IN COFFEE DAILY   Probiotic Product (PROBIOTIC DAILY PO) Take by mouth daily.   progesterone (PROMETRIUM) 100 MG capsule Take 100 mg by mouth daily.   rosuvastatin (CRESTOR) 5 MG tablet Take 1 tablet (5 mg total) by mouth daily.   Vitamin D, Ergocalciferol, (DRISDOL) 1.25 MG (50000 UNIT) CAPS capsule 1 pill 3 days a week for vitamin d deficiency   zolpidem (AMBIEN) 10 MG tablet Take 10 mg by mouth at bedtime as needed for sleep.   No current facility-administered medications on file prior to visit.    ROS: all negative except above.   Physical Exam:  There were no vitals taken for this visit.  General Appearance: Well nourished, in no apparent distress. Eyes: PERRLA, EOMs, conjunctiva no swelling or erythema Sinuses: No Frontal/maxillary tenderness ENT/Mouth: Ext aud canals clear, TMs without erythema, bulging. No erythema, swelling, or exudate on post pharynx.  Tonsils not swollen or erythematous. Hearing normal.  Neck: Supple, thyroid normal.  Respiratory: Respiratory effort normal, BS equal bilaterally without rales, rhonchi, wheezing or stridor.  Cardio: RRR with no MRGs. Brisk peripheral pulses without edema.  Abdomen: Soft, + BS.  Non tender, no guarding, rebound, hernias, masses. Lymphatics: Non tender without lymphadenopathy.  Musculoskeletal: Full ROM, 5/5 strength, normal gait.  Skin: Warm, dry without rashes, lesions, ecchymosis.  Neuro: Cranial nerves intact. Normal muscle tone, no cerebellar symptoms. Sensation intact.  Psych: Awake and oriented X 3, normal affect, Insight and Judgment appropriate.     Magda Bernheim, NP 12:25 PM [facility]

## 2022-02-18 ENCOUNTER — Ambulatory Visit (INDEPENDENT_AMBULATORY_CARE_PROVIDER_SITE_OTHER): Payer: BC Managed Care – PPO | Admitting: Nurse Practitioner

## 2022-02-18 ENCOUNTER — Encounter: Payer: Self-pay | Admitting: Nurse Practitioner

## 2022-02-18 ENCOUNTER — Other Ambulatory Visit: Payer: Self-pay

## 2022-02-18 VITALS — BP 124/70 | HR 70 | Temp 97.7°F | Wt 143.2 lb

## 2022-02-18 DIAGNOSIS — B029 Zoster without complications: Secondary | ICD-10-CM | POA: Diagnosis not present

## 2022-02-18 MED ORDER — VALACYCLOVIR HCL 1 G PO TABS: 1000.0000 mg | ORAL_TABLET | Freq: Two times a day (BID) | ORAL | 0 refills | Status: DC

## 2022-02-18 NOTE — Patient Instructions (Signed)
Shingles ?Shingles, which is also known as herpes zoster, is an infection that causes a painful skin rash and fluid-filled blisters. It is caused by a virus. ?Shingles only develops in people who: ?Have had chickenpox. ?Have been vaccinated against chickenpox. Shingles is rare in this group. ?What are the causes? ?Shingles is caused by varicella-zoster virus. This is the same virus that causes chickenpox. After a person is exposed to the virus, it stays in the body in an inactive (dormant) state. Shingles develops if the virus is reactivated. This can happen many years after the first (initial) exposure to the virus. It is not known what causes this virus to be reactivated. ?What increases the risk? ?People who have had chickenpox or received the chickenpox vaccine are at risk for shingles. Shingles infection is more common in people who: ?Are older than 65 years of age. ?Have a weakened disease-fighting system (immune system), such as people with: ?HIV (human immunodeficiency virus). ?AIDS (acquired immunodeficiency syndrome). ?Cancer. ?Are taking medicines that weaken the immune system, such as organ transplant medicines. ?Are experiencing a lot of stress. ?What are the signs or symptoms? ?Early symptoms of this condition include itching, tingling, and pain in an area on your skin. Pain may be described as burning, stabbing, or throbbing. ?A few days or weeks after early symptoms start, a painful red rash appears. The rash is usually on one side of the body and has a band-like or belt-like pattern. The rash eventually turns into fluid-filled blisters that break open, change into scabs, and dry up in about 2-3 weeks. ?At any time during the infection, you may also develop: ?A fever. ?Chills. ?A headache. ?Nausea. ?How is this diagnosed? ?This condition is diagnosed with a skin exam. Skin or fluid samples (a culture) may be taken from the blisters before a diagnosis is made. ?How is this treated? ?The rash may last  for several weeks. There is not a specific cure for this condition. Your health care provider may prescribe medicines to help you manage pain, recover more quickly, and avoid long-term problems. Medicines may include: ?Antiviral medicines. ?Anti-inflammatory medicines. ?Pain medicines. ?Anti-itching medicines (antihistamines). ?If the area involved is on your face, you may be referred to a specialist, such as an eye doctor (ophthalmologist) or an ear, nose, and throat (ENT) doctor (otorhinolaryngologist) to help you avoid eye problems, chronic pain, or disability. ?Follow these instructions at home: ?Medicines ?Take over-the-counter and prescription medicines only as told by your health care provider. ?Apply an anti-itch cream or numbing cream to the affected area as told by your health care provider. ?Relieving itching and discomfort ? ?Apply cold, wet cloths (cold compresses) to the area of the rash or blisters as told by your health care provider. ?Cool baths can be soothing. Try adding baking soda or dry oatmeal to the water to reduce itching. Do not bathe in hot water. ?Use calamine lotion as recommended by your health care provider. This is an over-the-counter lotion that helps to relieve itchiness. ?Blister and rash care ?Keep your rash covered with a loose bandage (dressing). Wear loose-fitting clothing to help ease the pain of material rubbing against the rash. ?Wash your hands with soap and water for at least 20 seconds before and after you change your dressing. If soap and water are not available, use hand sanitizer. ?Change your dressing as told by your health care provider. ?Keep your rash and blisters clean by washing the area with mild soap and cool water as told by your health   care provider. ?Check your rash every day for signs of infection. Check for: ?More redness, swelling, or pain. ?Fluid or blood. ?Warmth. ?Pus or a bad smell. ?Do not scratch your rash or pick at your blisters. To help avoid  scratching: ?Keep your fingernails clean and cut short. ?Wear gloves or mittens while you sleep, if scratching is a problem. ?General instructions ?Rest as told by your health care provider. ?Wash your hands often with soap and water for at least 20 seconds. If soap and water are not available, use hand sanitizer. Doing this lowers your chance of getting a bacterial skin infection. ?Before your blisters change into scabs, your shingles infection can cause chickenpox in people who have never had it or have never been vaccinated against it. To prevent this from happening, avoid contact with other people, especially: ?Babies. ?Pregnant women. ?Children who have eczema. ?Older people who have transplants. ?People who have chronic illnesses, such as cancer or AIDS. ?Keep all follow-up visits. This is important. ?How is this prevented? ?Getting vaccinated is the best way to prevent shingles and protect against shingles complications. If you have not been vaccinated, talk with your health care provider about getting the vaccine. ?Where to find more information ?Centers for Disease Control and Prevention: www.cdc.gov ?Contact a health care provider if: ?Your pain is not relieved with prescribed medicines. ?Your pain does not get better after the rash heals. ?You have any of these signs of infection: ?More redness, swelling, or pain around the rash. ?Fluid or blood coming from the rash. ?Warmth coming from your rash. ?Pus or a bad smell coming from the rash. ?A fever. ?Get help right away if: ?The rash is on your face or nose. ?You have facial pain, pain around your eye area, or loss of feeling on one side of your face. ?You have difficulty seeing. ?You have ear pain or have ringing in your ear. ?You have a loss of taste. ?Your condition gets worse. ?Summary ?Shingles, also known as herpes zoster, is an infection that causes a painful skin rash and fluid-filled blisters. ?This condition is diagnosed with a skin exam. Skin or  fluid samples (a culture) may be taken from the blisters. ?Keep your rash covered with a loose bandage (dressing). Wear loose-fitting clothing to help ease the pain of material rubbing against the rash. ?Before your blisters change into scabs, your shingles infection can cause chickenpox in people who have never had it or have never been vaccinated against it. ?This information is not intended to replace advice given to you by your health care provider. Make sure you discuss any questions you have with your health care provider. ?Document Revised: 11/19/2020 Document Reviewed: 11/19/2020 ?Elsevier Patient Education ? 2022 Elsevier Inc. ? ?

## 2022-06-09 NOTE — Progress Notes (Unsigned)
Follow up  Assessment and Plan:  Mixed hyperlipidemia Continue Rosuvastatin 5 mg decrease fatty foods, continue diet increase activity.  -     TSH -     Lipid Profile  Recurrent major depressive disorder, in partial remission (HCC) Continue lexapro 10 mg Practice relaxation techniques, diet and exercise  Vitamin D deficiency Continue Vit D supplementation to maintain value in therapeutic level of 60-100   GERD Patient advised to avoid spicy, acidic, citrus, chocolate, mints, fruit and fruit juices.  Limit the intake of caffeine, alcohol and Soda.  Don't exercise too soon after eating.  Don't lie down within 3-4 hours of eating.  Elevate the head of your bed. Continues on Prilosec - Magnesium  Medication management -     CBC with Diff -     COMPLETE METABOLIC PANEL WITH GFR -     Magnesium  Abnormal Glucose Continue diet and exercise   Overweight Long discussion about weight loss, diet, and exercise Recommended diet heavy in fruits and veggies and low in animal meats, cheeses, and dairy products, appropriate calorie intake Patient will work on increasing activity, limit sweets, increase protein Begin Phentermine 37.5 mg 1/2-1 tab daily Follow up at next visit  Bilateral foot pain/Family history of rheumatoid arthritis in mom -CRP - ANA - Rheumatoid factor    Discussed med's effects and SE's. Screening labs and tests as requested with regular follow-up as recommended.  HPI  66 y.o. female  presents for follow up for chol, vitamin D def.   Her blood pressure has been controlled at home, today their BP is BP: 135/77.   BP Readings from Last 3 Encounters:  06/11/22 135/77  02/18/22 124/70  12/11/21 112/60   She does workout, she is walking/riding bikes. She denies chest pain, shortness of breath, dizziness.   She has been noticing pain in both of her feet.  Denies numbness and tingling.  Mother has rheumatoid arthritis and patient is concerned.   BMI is Body  mass index is 27.59 kg/m., she is working on diet and exercise. She has not been doing much exercise- has no energy. She is eating regular meals- has noticed a larger appetite and craves sweets.  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   She is currently on estradiol and prometrium through Dr. Nori Riis for HRT.  She is still on lexapro only on '10mg'$  a day. She takes xanax 0.5 mg 1/2 very rarely per patient.    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 Her cholesterol is not at goal. The cholesterol last visit was:  Lab Results  Component Value Date   CHOL 265 (H) 12/11/2021   HDL 67 12/11/2021   LDLCALC 166 (H) 12/11/2021   TRIG 172 (H) 12/11/2021   CHOLHDL 4.0 12/11/2021  . She has been working on diet and exercise for prediabetes, she is on bASA, she is not on ACE/ARB and denies foot ulcerations, hyperglycemia, hypoglycemia , increased appetite, nausea, paresthesia of the feet, polydipsia, polyuria, visual disturbances, vomiting and weight loss. Last A1C in the office was:  Lab Results  Component Value Date   HGBA1C 5.6 12/11/2021   Patient is on Vitamin D supplement, vitamin D/calcium combo and vitamin D increased to 4000 IU from 2000IU.  Lab Results  Component Value Date   VD25OH 49 12/11/2021     Immunization History  Administered Date(s) Administered   Janssen (J&J) SARS-COV-2 Vaccination 02/17/2020   Tdap 02/04/2008, 08/03/2018   Zoster, Live 12/28/2012    Current Medications:  Current Outpatient Medications on File Prior to Visit  Medication Sig Dispense Refill   ALPRAZolam (XANAX) 0.5 MG tablet Take 1 tablet (0.5 mg total) by mouth 2 (two) times daily. AS NEEDED RARELY TAKES PER PT 45 tablet 0   cetirizine (ZYRTEC) 10 MG chewable tablet Chew 10 mg by  mouth daily.     escitalopram (LEXAPRO) 20 MG tablet Take 20 mg by mouth daily.     estradiol (ESTRACE) 2 MG tablet Take 2 mg by mouth daily.     Magnesium 400 MG TABS Take 400 mg by mouth daily. CURRENLTY 30 MG DAILY     omeprazole (PRILOSEC) 40 MG capsule TAKE 1 CAPSULE DAILY TO PREVENT HEARTBURN & INDIGESTION 90 capsule 3   OVER THE COUNTER MEDICATION COLLAGEN POWDER ONE SCOOP IN COFFEE DAILY     Probiotic Product (PROBIOTIC DAILY PO) Take by mouth daily.     progesterone (PROMETRIUM) 100 MG capsule Take 100 mg by mouth daily.     rosuvastatin (CRESTOR) 5 MG tablet Take 1 tablet (5 mg total) by mouth daily. 30 tablet 11   valACYclovir (VALTREX) 1000 MG tablet Take 1 tablet (1,000 mg total) by mouth 2 (two) times daily. For 10 days.  Start within 72 hours of symptom onset. 14 tablet 0   Vitamin D, Ergocalciferol, (DRISDOL) 1.25 MG (50000 UNIT) CAPS capsule 1 pill 3 days a week for vitamin d deficiency 36 capsule 1   zolpidem (AMBIEN) 10 MG tablet Take 10 mg by mouth at bedtime as needed for sleep.     No current facility-administered medications on file prior to visit.   Medical History:  Past Medical History:  Diagnosis Date   Allergy    Anxiety    Atypical nevus 04/13/2002   Left Chest-Slight to Moderate edges free   BCC (basal cell carcinoma of skin) 10/07/2011   Right Inner Shin   Depression    CONTROLLED WITH OCCASIONAL XANAX   Fibrocystic breast disease    GERD (gastroesophageal reflux disease)    Hyperlipidemia    Osteopenia    Prediabetes 09/05/2014   Vitamin D deficiency    Allergies Allergies  Allergen Reactions   Acyclovir And Related    Eggs Or Egg-Derived Products Hives    Patient states she eats eggs with no problem.  Received propofol with no reaction.    Monascus Purpureus Went Yeast Other (See Comments)    MYALGIA with higher doses   Neosporin [Neomycin-Bacitracin Zn-Polymyx] Hives   Penicillins Other (See Comments)   Red Yeast Rice [Cholestin] Other (See  Comments)    MYALGIA with higher doses   Sulfa Antibiotics Hives   Zostavax [Zoster Vaccine Live] Rash    SURGICAL HISTORY She  has a past surgical history that includes Dilation and curettage of uterus (2006); Novasure ablation (2006); Cervix lesion destruction; Skin cancer excision (Right, 10/2011); Carpal tunnel release (Right, 12/06/2015); Hernia repair; Bladder suspension (N/A, 09/10/2020); and Cystoscopy (N/A, 09/10/2020). FAMILY HISTORY Her family history includes Cancer in her paternal uncle; Diabetes in her paternal grandfather; Heart disease in her father and paternal grandfather; Hyperlipidemia in her father and mother; Hypertension in her father and mother; Rheum arthritis in her mother. SOCIAL HISTORY She  reports that she quit smoking about 32  years ago. Her smoking use included cigarettes. She has never used smokeless tobacco. She reports current alcohol use. She reports that she does not use drugs.'  Review of Systems: Review of Systems  Constitutional:  Negative for chills, fever and malaise/fatigue.  HENT:  Negative for congestion, ear pain and sore throat.   Respiratory:  Negative for cough, shortness of breath and wheezing.   Cardiovascular:  Negative for chest pain, palpitations and leg swelling.  Gastrointestinal:  Negative for abdominal pain, blood in stool, constipation, diarrhea, heartburn and melena.  Genitourinary: Negative.   Musculoskeletal:  Positive for joint pain (feet bilaterally).  Skin: Negative.   Neurological:  Negative for dizziness, sensory change, loss of consciousness and headaches.  Psychiatric/Behavioral:  Negative for depression. The patient is not nervous/anxious and does not have insomnia.     Physical Exam: Estimated body mass index is 27.59 kg/m as calculated from the following:   Height as of 12/11/21: '5\' 1"'$  (1.549 m).   Weight as of this encounter: 146 lb (66.2 kg). BP 135/77   Pulse 67   Temp (!) 96.9 F (36.1 C)   Wt 146 lb (66.2 kg)    SpO2 98%   BMI 27.59 kg/m   General Appearance: Well nourished well developed, in no apparent distress.  Eyes: PERRLA, EOMs, conjunctiva no swelling or erythema ENT/Mouth: Ear canals normal without obstruction, swelling, erythema, or discharge.  TMs normal bilaterally with no erythema, bulging, retraction, or loss of landmark.  Oropharynx moist and clear with no exudate, erythema, or swelling.   Neck: Supple, thyroid normal. No bruits.  No cervical adenopathy Respiratory: Respiratory effort normal, Breath sounds clear A&P without wheeze, rhonchi, rales.   Cardio: RRR without murmurs, rubs or gallops. Brisk peripheral pulses without edema.  Chest: symmetric, with normal excursions Breasts: defer  Abdomen: Soft, nontender, no guarding, rebound, hernias, masses, or organomegaly.  Lymphatics: Non tender without lymphadenopathy.  Musculoskeletal: Full ROM all peripheral extremities,5/5 strength, and normal gait.  Skin: Warm, dry without rashes, lesions, ecchymosis. Neuro: Awake and oriented X 3, Cranial nerves intact, reflexes equal bilaterally. Normal muscle tone, no cerebellar symptoms. Sensation intact.  Psych:  normal affect, Insight and Judgment appropriate.    Giavana Rooke Mikki Santee 2:29 PM [facility]

## 2022-06-11 ENCOUNTER — Ambulatory Visit (INDEPENDENT_AMBULATORY_CARE_PROVIDER_SITE_OTHER): Payer: BC Managed Care – PPO | Admitting: Nurse Practitioner

## 2022-06-11 ENCOUNTER — Encounter: Payer: Self-pay | Admitting: Nurse Practitioner

## 2022-06-11 ENCOUNTER — Other Ambulatory Visit: Payer: Self-pay | Admitting: Internal Medicine

## 2022-06-11 VITALS — BP 135/77 | HR 67 | Temp 96.9°F | Wt 146.0 lb

## 2022-06-11 DIAGNOSIS — Z79899 Other long term (current) drug therapy: Secondary | ICD-10-CM

## 2022-06-11 DIAGNOSIS — E559 Vitamin D deficiency, unspecified: Secondary | ICD-10-CM | POA: Diagnosis not present

## 2022-06-11 DIAGNOSIS — F3341 Major depressive disorder, recurrent, in partial remission: Secondary | ICD-10-CM

## 2022-06-11 DIAGNOSIS — E663 Overweight: Secondary | ICD-10-CM

## 2022-06-11 DIAGNOSIS — Z8261 Family history of arthritis: Secondary | ICD-10-CM

## 2022-06-11 DIAGNOSIS — R7309 Other abnormal glucose: Secondary | ICD-10-CM

## 2022-06-11 DIAGNOSIS — K219 Gastro-esophageal reflux disease without esophagitis: Secondary | ICD-10-CM | POA: Diagnosis not present

## 2022-06-11 DIAGNOSIS — M79672 Pain in left foot: Secondary | ICD-10-CM

## 2022-06-11 DIAGNOSIS — E782 Mixed hyperlipidemia: Secondary | ICD-10-CM | POA: Diagnosis not present

## 2022-06-11 DIAGNOSIS — M79671 Pain in right foot: Secondary | ICD-10-CM

## 2022-06-11 MED ORDER — PHENTERMINE HCL 37.5 MG PO TABS: ORAL_TABLET | ORAL | 5 refills | Status: DC

## 2022-06-11 NOTE — Patient Instructions (Addendum)
Fair life protein shakes Eat more frequently - try not to go more than 6 hours without protein Aim for 90 grams of protein a day- 30 breakfast/30 lunch 30 dinner Try to keep net carbs less than 50 Net Carbs=Total Carbs-fiber- sugar alcohols Exercise heartrate 120-140(fat burning zone)- walking 20-30 minutes 4 days a week   Phentermine Capsules or Tablets What is this medication? PHENTERMINE (FEN ter meen) promotes weight loss. It works by decreasing appetite. It is often used for a short period of time. Changes to diet and exercise are often combined with this medication. This medicine may be used for other purposes; ask your health care provider or pharmacist if you have questions. COMMON BRAND NAME(S): Adipex-P, Atti-Plex P, Atti-Plex P Spansule, Fastin, Lomaira, Pro-Fast, Pro-Fast HS, Pro-Fast SA, Tara-8 What should I tell my care team before I take this medication? They need to know if you have any of these conditions: Agitation or nervousness Diabetes Glaucoma Heart disease High blood pressure History of drug abuse or addiction History of stroke Kidney disease Lung disease called Primary Pulmonary Hypertension (PPH) Taken an MAOI like Carbex, Eldepryl, Marplan, Nardil, or Parnate in last 14 days Taking stimulant medications for attention disorders, weight loss, or to stay awake Thyroid disease An unusual or allergic reaction to phentermine, other medications, foods, dyes, or preservatives Pregnant or trying to get pregnant Breast-feeding How should I use this medication? Take this medication by mouth with a glass of water. Follow the directions on the prescription label. Take your medication at regular intervals. Do not take it more often than directed. Do not stop taking except on your care team's advice. Talk to your care team about the use of this medication in children. While this medication may be prescribed for children 17 years or older for selected conditions, precautions  do apply. Overdosage: If you think you have taken too much of this medicine contact a poison control center or emergency room at once. NOTE: This medicine is only for you. Do not share this medicine with others. What if I miss a dose? If you miss a dose, take it as soon as you can. If it is almost time for your next dose, take only that dose. Do not take double or extra doses. What may interact with this medication? Do not take this medication with any of the following: MAOIs like Carbex, Eldepryl, Marplan, Nardil, and Parnate This medication may also interact with the following: Alcohol Certain medications for depression, anxiety, or psychotic disorders Certain medications for high blood pressure Linezolid Medications for colds or breathing difficulties like pseudoephedrine or phenylephrine Medications for diabetes Sibutramine Stimulant medications for attention disorders, weight loss, or to stay awake This list may not describe all possible interactions. Give your health care provider a list of all the medicines, herbs, non-prescription drugs, or dietary supplements you use. Also tell them if you smoke, drink alcohol, or use illegal drugs. Some items may interact with your medicine. What should I watch for while using this medication? Visit your care team for regular checks on your progress. Do not stop taking except on your care team's advice. You may develop a severe reaction. Your care team will tell you how much medication to take. Do not take this medication close to bedtime. It may prevent you from sleeping. You may get drowsy or dizzy. Do not drive, use machinery, or do anything that needs mental alertness until you know how this medication affects you. Do not stand or sit up quickly, especially  if you are an older patient. This reduces the risk of dizzy or fainting spells. Alcohol may increase dizziness and drowsiness. Avoid alcoholic drinks. This medication may affect blood sugar  levels. Ask your care team if changes in diet or medications are needed if you have diabetes. Women should inform their care team if they wish to become pregnant or think they might be pregnant. Losing weight while pregnant is not advised and may cause harm to the unborn child. Talk to your care team for more information. What side effects may I notice from receiving this medication? Side effects that you should report to your care team as soon as possible: Allergic reactions--skin rash, itching, hives, swelling of the face, lips, tongue, or throat Heart failure--shortness of breath, swelling of the ankles, feet, or hands, sudden weight gain, unusual weakness or fatigue Pulmonary hypertension--shortness of breath, chest pain, fast or irregular heartbeat, feeling faint or lightheaded, fatigue, swelling of the ankles or feet Side effects that usually do not require medical attention (report to your care team if they continue or are bothersome): Change in taste Diarrhea Dizziness Dry mouth Restlessness Trouble sleeping This list may not describe all possible side effects. Call your doctor for medical advice about side effects. You may report side effects to FDA at 1-800-FDA-1088. Where should I keep my medication? Keep out of the reach of children. This medication can be abused. Keep your medication in a safe place to protect it from theft. Do not share this medication with anyone. Selling or giving away this medication is dangerous and against the law. This medication may cause harm and death if it is taken by other adults, children, or pets. Return medication that has not been used to an official disposal site. Contact the DEA at [phone] or your city/county government to find a site. If you cannot return the medication, mix any unused medication with a substance like cat litter or coffee grounds. Then throw the medication away in a sealed container like a sealed bag or coffee can with a lid.  Do not use the medication after the expiration date. Store at room temperature between 20 and 25 degrees C (68 and 77 degrees F). Keep container tightly closed. NOTE: This sheet is a summary. It may not cover all possible information. If you have questions about this medicine, talk to your doctor, pharmacist, or health care provider.  2023 Elsevier/Gold Standard (2021-10-25 00:00:00)

## 2022-06-12 ENCOUNTER — Other Ambulatory Visit: Payer: Self-pay | Admitting: Nurse Practitioner

## 2022-06-12 DIAGNOSIS — E663 Overweight: Secondary | ICD-10-CM

## 2022-06-12 MED ORDER — PHENTERMINE HCL 37.5 MG PO TABS: ORAL_TABLET | ORAL | 2 refills | Status: DC

## 2022-06-13 ENCOUNTER — Other Ambulatory Visit: Payer: Self-pay | Admitting: Nurse Practitioner

## 2022-06-13 DIAGNOSIS — Z8261 Family history of arthritis: Secondary | ICD-10-CM

## 2022-06-13 DIAGNOSIS — M79671 Pain in right foot: Secondary | ICD-10-CM

## 2022-06-13 LAB — CBC WITH DIFFERENTIAL/PLATELET
Absolute Monocytes: 497 cells/uL (ref 200–950)
Basophils Absolute: 74 cells/uL (ref 0–200)
Basophils Relative: 0.8 %
Eosinophils Absolute: 101 cells/uL (ref 15–500)
Eosinophils Relative: 1.1 %
HCT: 37.5 % (ref 35.0–45.0)
Hemoglobin: 12.6 g/dL (ref 11.7–15.5)
Lymphs Abs: 3551 cells/uL (ref 850–3900)
MCH: 32.7 pg (ref 27.0–33.0)
MCHC: 33.6 g/dL (ref 32.0–36.0)
MCV: 97.4 fL (ref 80.0–100.0)
MPV: 11.1 fL (ref 7.5–12.5)
Monocytes Relative: 5.4 %
Neutro Abs: 4977 cells/uL (ref 1500–7800)
Neutrophils Relative %: 54.1 %
Platelets: 263 10*3/uL (ref 140–400)
RBC: 3.85 10*6/uL (ref 3.80–5.10)
RDW: 12.7 % (ref 11.0–15.0)
Total Lymphocyte: 38.6 %
WBC: 9.2 10*3/uL (ref 3.8–10.8)

## 2022-06-13 LAB — COMPLETE METABOLIC PANEL WITH GFR
AG Ratio: 1.7 (calc) (ref 1.0–2.5)
ALT: 12 U/L (ref 6–29)
AST: 15 U/L (ref 10–35)
Albumin: 4.1 g/dL (ref 3.6–5.1)
Alkaline phosphatase (APISO): 37 U/L (ref 37–153)
BUN: 12 mg/dL (ref 7–25)
CO2: 26 mmol/L (ref 20–32)
Calcium: 9.2 mg/dL (ref 8.6–10.4)
Chloride: 103 mmol/L (ref 98–110)
Creat: 0.85 mg/dL (ref 0.50–1.05)
Globulin: 2.4 g/dL (calc) (ref 1.9–3.7)
Glucose, Bld: 90 mg/dL (ref 65–99)
Potassium: 4.6 mmol/L (ref 3.5–5.3)
Sodium: 139 mmol/L (ref 135–146)
Total Bilirubin: 0.3 mg/dL (ref 0.2–1.2)
Total Protein: 6.5 g/dL (ref 6.1–8.1)
eGFR: 76 mL/min/{1.73_m2} (ref >=60)

## 2022-06-13 LAB — ANTI-NUCLEAR AB-TITER (ANA TITER)
ANA TITER: 1:80 {titer} — ABNORMAL HIGH
ANA Titer 1: 1:80 {titer} — ABNORMAL HIGH

## 2022-06-13 LAB — LIPID PANEL
Cholesterol: 229 mg/dL — ABNORMAL HIGH (ref <200)
HDL: 73 mg/dL (ref >=50)
LDL Cholesterol (Calc): 122 mg/dL (calc) — ABNORMAL HIGH
Non-HDL Cholesterol (Calc): 156 mg/dL (calc) — ABNORMAL HIGH (ref <130)
Total CHOL/HDL Ratio: 3.1 (calc) (ref <5.0)
Triglycerides: 220 mg/dL — ABNORMAL HIGH (ref <150)

## 2022-06-13 LAB — TSH: TSH: 1.22 mIU/L (ref 0.40–4.50)

## 2022-06-13 LAB — RHEUMATOID FACTOR: Rheumatoid fact SerPl-aCnc: <14 IU/mL (ref <14)

## 2022-06-13 LAB — ANA: Anti Nuclear Antibody (ANA): POSITIVE — AB

## 2022-06-13 LAB — C-REACTIVE PROTEIN: CRP: 8.6 mg/L — ABNORMAL HIGH (ref <8.0)

## 2022-06-13 LAB — MAGNESIUM: Magnesium: 2.1 mg/dL (ref 1.5–2.5)

## 2022-06-16 NOTE — Progress Notes (Signed)
Referral sent to [facility], Attn: Leafy Kindle.

## 2022-06-25 DIAGNOSIS — Z1231 Encounter for screening mammogram for malignant neoplasm of breast: Secondary | ICD-10-CM | POA: Diagnosis not present

## 2022-07-10 NOTE — Progress Notes (Signed)
Assessment and Plan: Norma Cameron was seen today for follow-up.  Diagnoses and all orders for this visit:  Overweight Fair life protein shakes Eat more frequently - try not to go more than 6 hours without protein Aim for 90 grams of protein a day- 30 breakfast/30 lunch 30 dinner Try to keep net carbs less than 50 Net Carbs=Total Carbs-fiber- sugar alcohols Exercise heartrate 120-140(fat burning zone)- walking 20-30 minutes 4 days a week - really try to focus on increasing exercise Continue Phentermine  Recurrent major depressive disorder, in partial remission (HCC) Continue Lexapro, diet and exercise      Further disposition pending results of labs. Discussed med's effects and SE's.   Over 30 minutes of exam, counseling, chart review, and critical decision making was performed.   Future Appointments  Date Time Provider [place]  10/09/2022 11:30 AM Warren Danes, PA-C [facility]  12/11/2022  2:00 PM Alycia Rossetti, NP [facility] None    ------------------------------------------------------------------------------------------------------------------   HPI BP 132/70   Pulse 75   Temp (!) 97.3 F (36.3 C)   Ht '5\' 1"'$  (1.549 m)   Wt 140 lb 9.6 oz (63.8 kg)   SpO2 99%   BMI 26.57 kg/m   66 y.o.female presents for  BMI is Body mass index is 26.57 kg/m., she has been working on diet and exercise. She is eating more fresh fruits and vegetables and increased her protein. She is using Phentermine without any side effects. She has increased her water intake dramatically. She has not yet started exercise.  Wt Readings from Last 3 Encounters:  07/14/22 140 lb 9.6 oz (63.8 kg)  06/11/22 146 lb (66.2 kg)  02/18/22 143 lb 3.2 oz (65 kg)    Her BP has been well controlled at home BP Readings from Last 3 Encounters:  07/14/22 132/70  06/11/22 135/77  02/18/22 124/70   Mood is well controlled with Lexapro 20 mg daily.     Past Medical History:  Diagnosis Date    Allergy    Anxiety    Atypical nevus 04/13/2002   Left Chest-Slight to Moderate edges free   BCC (basal cell carcinoma of skin) 10/07/2011   Right Inner Shin   Depression    CONTROLLED WITH OCCASIONAL XANAX   Fibrocystic breast disease    GERD (gastroesophageal reflux disease)    Hyperlipidemia    Osteopenia    Prediabetes 09/05/2014   Vitamin D deficiency      Allergies  Allergen Reactions   Acyclovir And Related    Eggs Or Egg-Derived Products Hives    Patient states she eats eggs with no problem.  Received propofol with no reaction.    Monascus Purpureus Went Yeast Other (See Comments)    MYALGIA with higher doses   Neosporin [Neomycin-Bacitracin Zn-Polymyx] Hives   Penicillins Other (See Comments)   Red Yeast Rice [Cholestin] Other (See Comments)    MYALGIA with higher doses   Sulfa Antibiotics Hives   Zostavax [Zoster Vaccine Live] Rash    Current Outpatient Medications on File Prior to Visit  Medication Sig   ALPRAZolam (XANAX) 0.5 MG tablet Take 1 tablet (0.5 mg total) by mouth 2 (two) times daily. AS NEEDED RARELY TAKES PER PT   cetirizine (ZYRTEC) 10 MG chewable tablet Chew 10 mg by mouth daily.   escitalopram (LEXAPRO) 20 MG tablet Take 20 mg by mouth daily.   estradiol (ESTRACE) 2 MG tablet Take 2 mg by mouth daily.   Magnesium 400 MG TABS Take 400 mg  by mouth daily. CURRENLTY 30 MG DAILY   omeprazole (PRILOSEC) 40 MG capsule TAKE 1 CAPSULE BY MOUTH EVERY DAY TO PREVENT HEARTBURN & INDIGESTION   OVER THE COUNTER MEDICATION COLLAGEN POWDER ONE SCOOP IN COFFEE DAILY   phentermine (ADIPEX-P) 37.5 MG tablet Take 1/2 to 1 tablet every morning for dieting & weightloss   Probiotic Product (PROBIOTIC DAILY PO) Take by mouth daily.   progesterone (PROMETRIUM) 100 MG capsule Take 100 mg by mouth daily.   Psyllium (METAMUCIL) 0.36 g CAPS Take by mouth.   rosuvastatin (CRESTOR) 5 MG tablet Take 1 tablet (5 mg total) by mouth daily.   valACYclovir (VALTREX) 1000 MG tablet  Take 1 tablet (1,000 mg total) by mouth 2 (two) times daily. For 10 days.  Start within 72 hours of symptom onset.   Vitamin D, Ergocalciferol, (DRISDOL) 1.25 MG (50000 UNIT) CAPS capsule 1 pill 3 days a week for vitamin d deficiency   zolpidem (AMBIEN) 10 MG tablet Take 10 mg by mouth at bedtime as needed for sleep.   No current facility-administered medications on file prior to visit.    ROS: all negative except above.   Physical Exam:  BP 132/70   Pulse 75   Temp (!) 97.3 F (36.3 C)   Ht '5\' 1"'$  (1.549 m)   Wt 140 lb 9.6 oz (63.8 kg)   SpO2 99%   BMI 26.57 kg/m   General Appearance: Well nourished, in no apparent distress. Eyes: PERRLA, EOMs, conjunctiva no swelling or erythema Sinuses: No Frontal/maxillary tenderness ENT/Mouth: Ext aud canals clear, TMs without erythema, bulging. No erythema, swelling, or exudate on post pharynx.  Tonsils not swollen or erythematous. Hearing normal.  Neck: Supple, thyroid normal.  Respiratory: Respiratory effort normal, BS equal bilaterally without rales, rhonchi, wheezing or stridor.  Cardio: RRR with no MRGs. Brisk peripheral pulses without edema.  Abdomen: Soft, + BS.  Non tender, no guarding, rebound, hernias, masses. Lymphatics: Non tender without lymphadenopathy.  Musculoskeletal: Full ROM, 5/5 strength, normal gait.  Skin: Warm, dry without rashes, lesions, ecchymosis.  Neuro: Cranial nerves intact. Normal muscle tone, no cerebellar symptoms. Sensation intact.  Psych: Awake and oriented X 3, normal affect, Insight and Judgment appropriate.     Alycia Rossetti, NP 9:45 AM [facility]

## 2022-07-14 ENCOUNTER — Ambulatory Visit (INDEPENDENT_AMBULATORY_CARE_PROVIDER_SITE_OTHER): Payer: BC Managed Care – PPO | Admitting: Nurse Practitioner

## 2022-07-14 ENCOUNTER — Encounter: Payer: Self-pay | Admitting: Nurse Practitioner

## 2022-07-14 VITALS — BP 132/70 | HR 75 | Temp 97.3°F | Ht 61.0 in | Wt 140.6 lb

## 2022-07-14 DIAGNOSIS — F3341 Major depressive disorder, recurrent, in partial remission: Secondary | ICD-10-CM | POA: Diagnosis not present

## 2022-07-14 DIAGNOSIS — E663 Overweight: Secondary | ICD-10-CM | POA: Diagnosis not present

## 2022-08-01 ENCOUNTER — Ambulatory Visit (INDEPENDENT_AMBULATORY_CARE_PROVIDER_SITE_OTHER): Payer: BC Managed Care – PPO | Admitting: Podiatry

## 2022-08-01 ENCOUNTER — Encounter: Payer: Self-pay | Admitting: Podiatry

## 2022-08-01 DIAGNOSIS — L600 Ingrowing nail: Secondary | ICD-10-CM | POA: Diagnosis not present

## 2022-08-01 NOTE — Patient Instructions (Signed)

## 2022-08-02 NOTE — Progress Notes (Signed)
Subjective:   Patient ID: Norma Cameron, female   DOB: 66 y.o.   MRN: [id number]   HPI Patient states she has developed an ingrown toenail of her left big toe and its sore and it makes it hard to wear shoe gear comfortably   ROS      Objective:  Physical Exam  Neurovascular status intact muscle strength adequate with patient noted to have incurvated left hallux medial border painful when pressed with inability to wear shoe gear comfortably.  No active drainage noted     Assessment:  Ingrown toenail deformity left hallux medial border with pain     Plan:  Read viewed that the entire nail has moderate damage to it ultimately may may require permanent procedure the entire nail border just get a focus on the ingrown component today I infiltrated the left hallux 60 mg like Marcaine mixture I first had her sign consent form and I then did sterile prep and then using sterile instrumentation remove the medial border exposed matrix applied phenol 3 applications 30 seconds followed by alcohol lavage sterile dressing gave instructions on soaks and to leave dressing on 24 hours but take it off earlier if throbbing were to occur.  Reappoint to recheck

## 2022-08-20 NOTE — Progress Notes (Signed)
Assessment and Plan:  Norma Cameron was seen today for follow-up.  Diagnoses and all orders for this visit:  Recurrent major depressive disorder, in partial remission (Norma Cameron) Continue medication Continue diet, exercise, practice good sleep hygiene  Overweight Fair life protein shakes Eat more frequently - try not to go more than 6 hours without protein Aim for 90 grams of protein a day- 30 breakfast/30 lunch 30 dinner Try to keep net carbs less than 50 Net Carbs=Total Carbs-fiber- sugar alcohols Exercise heartrate 120-140(fat burning zone)- walking 20-30 minutes 4 days a week  Down 4 pounds in the past month -     phentermine (ADIPEX-P) 37.5 MG tablet; Take 1/2 to 1 tablet every morning for dieting & weightloss       Further disposition pending results of labs. Discussed med's effects and SE's.   Over 30 minutes of exam, counseling, chart review, and critical decision making was performed.   Future Appointments  Date Time Provider [place]  12/11/2022  2:00 PM Alycia Rossetti, NP [facility] None    ------------------------------------------------------------------------------------------------------------------   HPI BP 134/74   Pulse 76   Temp (!) 97.5 F (36.4 C)   Ht '5\' 1"'$  (1.549 m)   Wt 137 lb (62.1 kg)   SpO2 99%   BMI 25.89 kg/m  66 y.o.female presents for reevaluation of weight   BMI is Body mass index is 25.89 kg/m., she has been working on diet and exercise. Was on vacation last week. She is walking more. Has increased protein. Phentermine Wt Readings from Last 3 Encounters:  08/21/22 137 lb (62.1 kg)  07/14/22 140 lb 9.6 oz (63.8 kg)  06/11/22 146 lb (66.2 kg)    She continues to care for her mother. Mood is fairly well controlled on Lexapro and Xanax.  Past Medical History:  Diagnosis Date   Allergy    Anxiety    Atypical nevus 04/13/2002   Left Chest-Slight to Moderate edges free   BCC (basal cell carcinoma of skin) 10/07/2011   Right Inner  Shin   Depression    CONTROLLED WITH OCCASIONAL XANAX   Fibrocystic breast disease    GERD (gastroesophageal reflux disease)    Hyperlipidemia    Osteopenia    Prediabetes 09/05/2014   Vitamin D deficiency      Allergies  Allergen Reactions   Acyclovir And Related    Eggs Or Egg-Derived Products Hives    Patient states she eats eggs with no problem.  Received propofol with no reaction.    Monascus Purpureus Went Yeast Other (See Comments)    MYALGIA with higher doses   Neosporin [Neomycin-Bacitracin Zn-Polymyx] Hives   Penicillins Other (See Comments)   Red Yeast Rice [Cholestin] Other (See Comments)    MYALGIA with higher doses   Sulfa Antibiotics Hives   Zostavax [Zoster Vaccine Live] Rash    Current Outpatient Medications on File Prior to Visit  Medication Sig   ALPRAZolam (XANAX) 0.5 MG tablet Take 1 tablet (0.5 mg total) by mouth 2 (two) times daily. AS NEEDED RARELY TAKES PER PT   cetirizine (ZYRTEC) 10 MG chewable tablet Chew 10 mg by mouth daily.   escitalopram (LEXAPRO) 20 MG tablet Take 20 mg by mouth daily.   estradiol (ESTRACE) 2 MG tablet Take 2 mg by mouth daily.   Magnesium 400 MG TABS Take 400 mg by mouth daily. CURRENLTY 30 MG DAILY   omeprazole (PRILOSEC) 40 MG capsule TAKE 1 CAPSULE BY MOUTH EVERY DAY TO PREVENT HEARTBURN & INDIGESTION  OVER THE COUNTER MEDICATION COLLAGEN POWDER ONE SCOOP IN COFFEE DAILY   phentermine (ADIPEX-P) 37.5 MG tablet Take 1/2 to 1 tablet every morning for dieting & weightloss   Probiotic Product (PROBIOTIC DAILY PO) Take by mouth daily.   progesterone (PROMETRIUM) 100 MG capsule Take 100 mg by mouth daily.   rosuvastatin (CRESTOR) 5 MG tablet Take 1 tablet (5 mg total) by mouth daily.   Vitamin D, Ergocalciferol, (DRISDOL) 1.25 MG (50000 UNIT) CAPS capsule 1 pill 3 days a week for vitamin d deficiency   zolpidem (AMBIEN) 10 MG tablet Take 10 mg by mouth at bedtime as needed for sleep.   Psyllium (METAMUCIL) 0.36 g CAPS Take by  mouth. (Patient not taking: Reported on 08/21/2022)   valACYclovir (VALTREX) 1000 MG tablet Take 1 tablet (1,000 mg total) by mouth 2 (two) times daily. For 10 days.  Start within 72 hours of symptom onset. (Patient not taking: Reported on 08/21/2022)   No current facility-administered medications on file prior to visit.    ROS: all negative except above.   Physical Exam:  BP 134/74   Pulse 76   Temp (!) 97.5 F (36.4 C)   Ht '5\' 1"'$  (1.549 m)   Wt 137 lb (62.1 kg)   SpO2 99%   BMI 25.89 kg/m   General Appearance: Well nourished, in no apparent distress. Eyes: PERRLA, EOMs, conjunctiva no swelling or erythema Sinuses: No Frontal/maxillary tenderness ENT/Mouth: Ext aud canals clear, TMs without erythema, bulging. No erythema, swelling, or exudate on post pharynx.  Tonsils not swollen or erythematous. Hearing normal.  Neck: Supple, thyroid normal.  Respiratory: Respiratory effort normal, BS equal bilaterally without rales, rhonchi, wheezing or stridor.  Cardio: RRR with no MRGs. Brisk peripheral pulses without edema.  Abdomen: Soft, + BS.  Non tender, no guarding, rebound, hernias, masses. Lymphatics: Non tender without lymphadenopathy.  Musculoskeletal: Full ROM, 5/5 strength, normal gait.  Skin: Warm, dry without rashes, lesions, ecchymosis.  Neuro: Cranial nerves intact. Normal muscle tone, no cerebellar symptoms. Sensation intact.  Psych: Awake and oriented X 3, normal affect, Insight and Judgment appropriate.     Alycia Rossetti, NP 10:54 AM [facility]

## 2022-08-21 ENCOUNTER — Ambulatory Visit (INDEPENDENT_AMBULATORY_CARE_PROVIDER_SITE_OTHER): Payer: BC Managed Care – PPO | Admitting: Nurse Practitioner

## 2022-08-21 ENCOUNTER — Encounter: Payer: Self-pay | Admitting: Nurse Practitioner

## 2022-08-21 VITALS — BP 134/74 | HR 76 | Temp 97.5°F | Ht 61.0 in | Wt 137.0 lb

## 2022-08-21 DIAGNOSIS — E663 Overweight: Secondary | ICD-10-CM | POA: Diagnosis not present

## 2022-08-21 DIAGNOSIS — F3341 Major depressive disorder, recurrent, in partial remission: Secondary | ICD-10-CM | POA: Diagnosis not present

## 2022-08-21 MED ORDER — PHENTERMINE HCL 37.5 MG PO TABS: ORAL_TABLET | ORAL | 2 refills | Status: DC

## 2022-08-27 DIAGNOSIS — M79671 Pain in right foot: Secondary | ICD-10-CM | POA: Diagnosis not present

## 2022-08-27 DIAGNOSIS — M79641 Pain in right hand: Secondary | ICD-10-CM | POA: Diagnosis not present

## 2022-08-27 DIAGNOSIS — M79672 Pain in left foot: Secondary | ICD-10-CM | POA: Diagnosis not present

## 2022-08-27 DIAGNOSIS — M79642 Pain in left hand: Secondary | ICD-10-CM | POA: Diagnosis not present

## 2022-08-28 DIAGNOSIS — Z01419 Encounter for gynecological examination (general) (routine) without abnormal findings: Secondary | ICD-10-CM | POA: Diagnosis not present

## 2022-08-28 DIAGNOSIS — Z6826 Body mass index (BMI) 26.0-26.9, adult: Secondary | ICD-10-CM | POA: Diagnosis not present

## 2022-09-25 DIAGNOSIS — M79671 Pain in right foot: Secondary | ICD-10-CM | POA: Diagnosis not present

## 2022-09-25 DIAGNOSIS — M79641 Pain in right hand: Secondary | ICD-10-CM | POA: Diagnosis not present

## 2022-09-25 DIAGNOSIS — M1991 Primary osteoarthritis, unspecified site: Secondary | ICD-10-CM | POA: Diagnosis not present

## 2022-09-25 DIAGNOSIS — M79642 Pain in left hand: Secondary | ICD-10-CM | POA: Diagnosis not present

## 2022-10-09 ENCOUNTER — Other Ambulatory Visit: Payer: Self-pay

## 2022-10-09 ENCOUNTER — Ambulatory Visit: Payer: BC Managed Care – PPO | Admitting: Physician Assistant

## 2022-10-09 ENCOUNTER — Encounter: Payer: Self-pay | Admitting: Nurse Practitioner

## 2022-10-09 ENCOUNTER — Ambulatory Visit: Payer: BC Managed Care – PPO | Admitting: Nurse Practitioner

## 2022-10-09 VITALS — HR 83 | Temp 97.2°F

## 2022-10-09 DIAGNOSIS — Z1152 Encounter for screening for COVID-19: Secondary | ICD-10-CM | POA: Diagnosis not present

## 2022-10-09 DIAGNOSIS — U071 COVID-19: Secondary | ICD-10-CM | POA: Diagnosis not present

## 2022-10-09 DIAGNOSIS — R6889 Other general symptoms and signs: Secondary | ICD-10-CM

## 2022-10-09 LAB — POCT INFLUENZA A/B
Influenza A, POC: NEGATIVE
Influenza B, POC: NEGATIVE

## 2022-10-09 LAB — POC COVID19 BINAXNOW: SARS Coronavirus 2 Ag: POSITIVE — AB

## 2022-10-09 MED ORDER — DEXAMETHASONE 1 MG PO TABS: ORAL_TABLET | ORAL | 0 refills | Status: DC

## 2022-10-09 MED ORDER — AZITHROMYCIN 250 MG PO TABS: ORAL_TABLET | ORAL | 1 refills | Status: DC

## 2022-10-09 NOTE — Patient Instructions (Signed)
Take tylenol PRN temp 101+ Push hydration Regular ambulation or calf exercises exercises for clot prevention and 81 mg ASA unless contraindicated Sx supportive therapy suggested Follow up via mychart or telephone if needed Advised patient obtain O2 monitor; present to ED if persistently <90% or with severe dyspnea, CP, fever uncontrolled by tylenol, confusion, sudden decline Should remain in isolation 5 days and then wear a mask when around anyone the next 5 days.

## 2022-10-09 NOTE — Progress Notes (Signed)
THIS ENCOUNTER IS A VIRTUAL VISIT DUE TO COVID-19 - PATIENT WAS NOT SEEN IN THE OFFICE.  PATIENT HAS CONSENTED TO VIRTUAL VISIT / TELEMEDICINE VISIT   Virtual Visit via telephone Note  I connected with  Madaline Guthrie on 10/09/2022 by telephone.  I verified that I am speaking with the correct person using two identifiers.    I discussed the limitations of evaluation and management by telemedicine and the availability of in person appointments. The patient expressed understanding and agreed to proceed.  History of Present Illness:  Pulse 83   Temp (!) 97.2 F (36.2 C)   SpO2 96%  66 y.o. patient contacted office reporting URI sx . she tested positive by test in office parking lot today. OV was conducted by telephone to minimize exposure. This patient was vaccinated for covid 19, last 2022 Immunization History  Administered Date(s) Administered   Janssen (J&J) SARS-COV-2 Vaccination 02/17/2020   Tdap 02/04/2008, 08/03/2018   Zoster, Live 12/28/2012     Sx began 2 days ago with body aches, joint pain, fever, chills , runny nose/congestion, sore throat, nonproductive cough  Treatments tried so far: Tylenol and Benadryl  Exposures: Unknown   Medications  Current Outpatient Medications (Endocrine & Metabolic):    estradiol (ESTRACE) 2 MG tablet, Take 2 mg by mouth daily. (Patient not taking: Reported on 10/09/2022)   progesterone (PROMETRIUM) 100 MG capsule, Take 100 mg by mouth daily. (Patient not taking: Reported on 10/09/2022)  Current Outpatient Medications (Cardiovascular):    rosuvastatin (CRESTOR) 5 MG tablet, Take 1 tablet (5 mg total) by mouth daily.  Current Outpatient Medications (Respiratory):    cetirizine (ZYRTEC) 10 MG chewable tablet, Chew 10 mg by mouth daily.   diphenhydrAMINE HCl (BENADRYL PO), Take by mouth.  Current Outpatient Medications (Analgesics):    Acetaminophen (TYLENOL PO), Take by mouth.   Current Outpatient Medications (Other):    ALPRAZolam  (XANAX) 0.5 MG tablet, Take 1 tablet (0.5 mg total) by mouth 2 (two) times daily. AS NEEDED RARELY TAKES PER PT   escitalopram (LEXAPRO) 20 MG tablet, Take 20 mg by mouth daily.   Magnesium 400 MG TABS, Take 400 mg by mouth daily. CURRENLTY 30 MG DAILY   omeprazole (PRILOSEC) 40 MG capsule, TAKE 1 CAPSULE BY MOUTH EVERY DAY TO PREVENT HEARTBURN & INDIGESTION   phentermine (ADIPEX-P) 37.5 MG tablet, Take 1/2 to 1 tablet every morning for dieting & weightloss   Probiotic Product (PROBIOTIC DAILY PO), Take by mouth daily.   zolpidem (AMBIEN) 10 MG tablet, Take 10 mg by mouth at bedtime as needed for sleep.   OVER THE COUNTER MEDICATION, COLLAGEN POWDER ONE SCOOP IN COFFEE DAILY (Patient not taking: Reported on 10/09/2022)   Psyllium (METAMUCIL) 0.36 g CAPS, Take by mouth. (Patient not taking: Reported on 10/09/2022)   valACYclovir (VALTREX) 1000 MG tablet, Take 1 tablet (1,000 mg total) by mouth 2 (two) times daily. For 10 days.  Start within 72 hours of symptom onset. (Patient not taking: Reported on 08/21/2022)   Vitamin D, Ergocalciferol, (DRISDOL) 1.25 MG (50000 UNIT) CAPS capsule, 1 pill 3 days a week for vitamin d deficiency (Patient not taking: Reported on 10/09/2022)  Allergies:  Allergies  Allergen Reactions   Acyclovir And Related    Eggs Or Egg-Derived Products Hives    Patient states she eats eggs with no problem.  Received propofol with no reaction.    Monascus Purpureus Went Yeast Other (See Comments)    MYALGIA with higher doses   Neosporin [Neomycin-Bacitracin  Zn-Polymyx] Hives   Penicillins Other (See Comments)   Red Yeast Rice [Cholestin] Other (See Comments)    MYALGIA with higher doses   Sulfa Antibiotics Hives   Zostavax [Zoster Vaccine Live] Rash    Problem list She has Hyperlipidemia; Vitamin D deficiency; Allergy; Depression; Medication management; Climacteric; Abnormal glucose; Agatston coronary artery calcium score less than 100; and Gastroesophageal reflux disease  without esophagitis on their problem list.   Social History:   reports that she quit smoking about 32 years ago. Her smoking use included cigarettes. She has never used smokeless tobacco. She reports current alcohol use. She reports that she does not use drugs.  Observations/Objective:  General : Well sounding patient in no apparent distress HEENT: no hoarseness, no cough for duration of visit Lungs: speaks in complete sentences, no audible wheezing, no apparent distress Neurological: alert, oriented x 3 Psychiatric: pleasant, judgement appropriate   Assessment and Plan:  Covid 19 Covid 19 positive per rapid screening test in office parking lot today Risk factors include: has Hyperlipidemia; Vitamin D deficiency; Allergy; Depression; Medication management; Climacteric; Abnormal glucose; Agatston coronary artery calcium score less than 100; and Gastroesophageal reflux disease without esophagitis on their problem list.  Symptoms are: moderate Due to co morbid conditions and risk factors, discussed antivirals  Immue support reviewed  Take tylenol PRN temp 101+ Push hydration Regular ambulation or calf exercises exercises for clot prevention and 81 mg ASA unless contraindicated Sx supportive therapy suggested Follow up via mychart or telephone if needed Advised patient obtain O2 monitor; present to ED if persistently <90% or with severe dyspnea, CP, fever uncontrolled by tylenol, confusion, sudden decline Should remain in isolation 5 days and then wear a mask when around anyone the next 5 days.    Encounter for screening for COVID-19 -     POC COVID-19- positive  Flu-like symptoms -     POCT Influenza A/B- negative  COVID-19 -     azithromycin (ZITHROMAX) 250 MG tablet; Take 2 tablets (500 mg) on  Day 1,  followed by 1 tablet (250 mg) once daily on Days 2 through 5. -     dexamethasone (DECADRON) 1 MG tablet; Take 3 tabs for 3 days, 2 tabs for 3 days 1 tab for 5 days. Take with  food.      Follow Up Instructions:  I discussed the assessment and treatment plan with the patient. The patient was provided an opportunity to ask questions and all were answered. The patient agreed with the plan and demonstrated an understanding of the instructions.   The patient was advised to call back or seek an in-person evaluation if the symptoms worsen or if the condition fails to improve as anticipated.  I provided 20 minutes of non-face-to-face time during this encounter.   Alycia Rossetti, NP

## 2022-12-10 DIAGNOSIS — N6310 Unspecified lump in the right breast, unspecified quadrant: Secondary | ICD-10-CM | POA: Diagnosis not present

## 2022-12-10 NOTE — Progress Notes (Unsigned)
COMPLETE PHYSICAL  Assessment and Plan:  Encounter for general adult medical examination with abnormal findings -     CBC with Diff -     COMPLETE METABOLIC PANEL WITH GFR -     TSH -     Lipid Profile -     Hemoglobin A1c ([facility]) -     Magnesium -     Vitamin D (25 hydroxy) -     Urinalysis, Routine w reflex microscopic -     Microalbumin / Creatinine Urine Ratio CANNOT ACCESS MYCHART_- CALL WITH RESULTS  Mixed hyperlipidemia check lipids decrease fatty foods increase activity.  -     TSH -     Lipid Profile  Recurrent major depressive disorder, in partial remission (HCC)/Anxiety Continue lexapro 10 mg Xanax 0.'5mg'$  twice a day as needed   Vitamin D deficiency -     Vitamin D (25 hydroxy)  Climacteric Follows with Dr. Nori Riis Continue Estrace and Prometrium  Abnormal glucose -     Hemoglobin A1c ([facility]) Discussed disease progression and risks Discussed diet/exercise, weight management and risk modification  Screening for blood or protein in urine -     Urinalysis, Routine w reflex microscopic -     Microalbumin / Creatinine Urine Ratio  Screening for ischemic heart disease EKG  Gastroesophageal reflux disease without esophagitis Refilled Prilosec 40 mg to pharamcy Continue dietary modifications  Medication management Magnesium     Future Appointments  Date Time Provider [place]  12/11/2022  2:00 PM Alycia Rossetti, NP [facility] None    Discussed med's effects and SE's. Screening labs and tests as requested with regular follow-up as recommended.  HPI  67 y.o. female  presents for a complete physical.   She has noticed a fullness in her ears, not a pain but notices heart beating in ears  Her blood pressure has been controlled at home, today their BP is  .   BP Readings from Last 3 Encounters:  08/21/22 134/74  07/14/22 132/70  06/11/22 135/77    She does workout, since the pandemic she is not attending classes but she is  walking/riding bikes. She denies chest pain, shortness of breath, dizziness.   BMI is There is no height or weight on file to calculate BMI., she is working on diet and exercise. Wt Readings from Last 3 Encounters:  08/21/22 137 lb (62.1 kg)  07/14/22 140 lb 9.6 oz (63.8 kg)  06/11/22 146 lb (66.2 kg)   She works with [employer], has returned to seeing clients in the office as well as on phone.   She is taking Estrace/ Prometrium for HRT with Dr. Nori Riis.    She is having increased social stress currently.  Father passed away after Thanksgiving and daughter-in-law has thyroid cancer. She is still on lexapro only on '10mg'$  a day. She has needed to use her Xanax more frequently due to stress. Had DEXA 11/2020, MGM 03/2021, PAP 07/2021 per patient- normal.   She is not on cholesterol medication and denies myalgias. Her cholesterol is not at goal. The cholesterol last visit was:  Lab Results  Component Value Date   CHOL 229 (H) 06/11/2022   HDL 73 06/11/2022   LDLCALC 122 (H) 06/11/2022   TRIG 220 (H) 06/11/2022   CHOLHDL 3.1 06/11/2022  . She has been working on diet and exercise for prediabetes, she is on bASA, she is not on ACE/ARB and denies foot ulcerations, hyperglycemia, hypoglycemia , increased appetite, nausea, paresthesia of the feet, polydipsia,  polyuria, visual disturbances, vomiting and weight loss. Last A1C in the office was:  Lab Results  Component Value Date   HGBA1C 5.6 12/11/2021   Patient is on Vitamin D supplement, vitamin D/calcium combo and vitamin D 2000 IU.    Lab Results  Component Value Date   VD25OH 49 12/11/2021     She does take 1/2 Ambien at night to sleep.   Current Medications:  Current Outpatient Medications on File Prior to Visit  Medication Sig Dispense Refill   Acetaminophen (TYLENOL PO) Take by mouth.     ALPRAZolam (XANAX) 0.5 MG tablet Take 1 tablet (0.5 mg total) by mouth 2 (two) times daily. AS NEEDED RARELY TAKES PER PT 45 tablet 0    azithromycin (ZITHROMAX) 250 MG tablet Take 2 tablets (500 mg) on  Day 1,  followed by 1 tablet (250 mg) once daily on Days 2 through 5. 6 each 1   cetirizine (ZYRTEC) 10 MG chewable tablet Chew 10 mg by mouth daily.     dexamethasone (DECADRON) 1 MG tablet Take 3 tabs for 3 days, 2 tabs for 3 days 1 tab for 5 days. Take with food. 20 tablet 0   diphenhydrAMINE HCl (BENADRYL PO) Take by mouth.     escitalopram (LEXAPRO) 20 MG tablet Take 20 mg by mouth daily.     estradiol (ESTRACE) 2 MG tablet Take 2 mg by mouth daily. (Patient not taking: Reported on 10/09/2022)     Magnesium 400 MG TABS Take 400 mg by mouth daily. CURRENLTY 30 MG DAILY     omeprazole (PRILOSEC) 40 MG capsule TAKE 1 CAPSULE BY MOUTH EVERY DAY TO PREVENT HEARTBURN & INDIGESTION 90 capsule 3   OVER THE COUNTER MEDICATION COLLAGEN POWDER ONE SCOOP IN COFFEE DAILY (Patient not taking: Reported on 10/09/2022)     phentermine (ADIPEX-P) 37.5 MG tablet Take 1/2 to 1 tablet every morning for dieting & weightloss 30 tablet 2   Probiotic Product (PROBIOTIC DAILY PO) Take by mouth daily.     progesterone (PROMETRIUM) 100 MG capsule Take 100 mg by mouth daily. (Patient not taking: Reported on 10/09/2022)     Psyllium (METAMUCIL) 0.36 g CAPS Take by mouth. (Patient not taking: Reported on 10/09/2022)     rosuvastatin (CRESTOR) 5 MG tablet Take 1 tablet (5 mg total) by mouth daily. 30 tablet 11   valACYclovir (VALTREX) 1000 MG tablet Take 1 tablet (1,000 mg total) by mouth 2 (two) times daily. For 10 days.  Start within 72 hours of symptom onset. (Patient not taking: Reported on 08/21/2022) 14 tablet 0   Vitamin D, Ergocalciferol, (DRISDOL) 1.25 MG (50000 UNIT) CAPS capsule 1 pill 3 days a week for vitamin d deficiency (Patient not taking: Reported on 10/09/2022) 36 capsule 1   zolpidem (AMBIEN) 10 MG tablet Take 10 mg by mouth at bedtime as needed for sleep.     No current facility-administered medications on file prior to visit.    Health  Maintenance:   Immunization History  Administered Date(s) Administered   Janssen (J&J) SARS-COV-2 Vaccination 02/17/2020   Tdap 02/04/2008, 08/03/2018   Zoster, Live 12/28/2012   Tetanus: 2019 Influenza ALLERGY to EGGS Zostavax: 2014 suggest new one before 70  Pap:  07/2019, never abnormal, Dr. Gaetano Net MGM: 2022  AT Dr. Gaetano Net DEXA: 2021 Dr. Nori Riis Colonoscopy: 2014 due will get Dr. Earlean Shawl-  2024 Last Dental Exam: Dr. Berdine Addison 2022 Last Eye Exam: Dr. Nicki Reaper, 2022  Patient Care Team: Unk Pinto, MD as PCP - General (  Internal Medicine) Macarthur Critchley, [place] as Referring Physician (Optometry) Richmond Campbell, MD as Consulting Physician (Gastroenterology) Maisie Fus, MD (Inactive) as Consulting Physician (Obstetrics and Gynecology) Fayette Pho, MD as Referring Physician (Orthopedic Surgery) Lavonna Monarch, MD (Inactive) as Consulting Physician (Dermatology)  Medical History:  Past Medical History:  Diagnosis Date   Allergy    Anxiety    Atypical nevus 04/13/2002   Left Chest-Slight to Moderate edges free   BCC (basal cell carcinoma of skin) 10/07/2011   Right Inner Shin   Depression    CONTROLLED WITH OCCASIONAL XANAX   Fibrocystic breast disease    GERD (gastroesophageal reflux disease)    Hyperlipidemia    Osteopenia    Prediabetes 09/05/2014   Vitamin D deficiency    Allergies Allergies  Allergen Reactions   Acyclovir And Related    Eggs Or Egg-Derived Products Hives    Patient states she eats eggs with no problem.  Received propofol with no reaction.    Monascus Purpureus Went Yeast Other (See Comments)    MYALGIA with higher doses   Neosporin [Neomycin-Bacitracin Zn-Polymyx] Hives   Penicillins Other (See Comments)   Red Yeast Rice [Cholestin] Other (See Comments)    MYALGIA with higher doses   Sulfa Antibiotics Hives   Zostavax [Zoster Vaccine Live] Rash    SURGICAL HISTORY She  has a past surgical history that includes Dilation and curettage of uterus  (2006); Novasure ablation (2006); Cervix lesion destruction; Skin cancer excision (Right, 10/2011); Carpal tunnel release (Right, 12/06/2015); Hernia repair; Bladder suspension (N/A, 09/10/2020); and Cystoscopy (N/A, 09/10/2020). FAMILY HISTORY Her family history includes Cancer in her paternal uncle; Diabetes in her paternal grandfather; Heart disease in her father and paternal grandfather; Hyperlipidemia in her father and mother; Hypertension in her father and mother; Rheum arthritis in her mother. SOCIAL HISTORY She  reports that she quit smoking about 32 years ago. Her smoking use included cigarettes. She has never used smokeless tobacco. She reports current alcohol use. She reports that she does not use drugs.'  Review of Systems: Review of Systems  Constitutional:  Negative for chills, fever and malaise/fatigue.  HENT:  Positive for ear pain (fullness). Negative for congestion and sore throat.   Respiratory:  Negative for cough, shortness of breath and wheezing.   Cardiovascular:  Negative for chest pain, palpitations and leg swelling.  Gastrointestinal:  Negative for abdominal pain, blood in stool, constipation, diarrhea, heartburn, melena, nausea and vomiting.  Genitourinary: Negative.  Negative for dysuria.  Musculoskeletal:  Negative for back pain and joint pain.  Skin: Negative.   Neurological:  Negative for dizziness, sensory change, loss of consciousness and headaches.  Psychiatric/Behavioral:  Negative for depression. The patient is nervous/anxious. The patient does not have insomnia.     Physical Exam: Estimated body mass index is 25.89 kg/m as calculated from the following:   Height as of 08/21/22: '5\' 1"'$  (1.549 m).   Weight as of 08/21/22: 137 lb (62.1 kg). There were no vitals taken for this visit.  General Appearance: Well nourished well developed, in no apparent distress.  Eyes: PERRLA, EOMs, conjunctiva no swelling or erythema ENT/Mouth: Ear canals normal without  obstruction, swelling, erythema, or discharge.  L TM normal bilaterally with no erythema, bulging, retraction, or loss of landmark. R TM dull with fluid Oropharynx moist and clear with no exudate, erythema, or swelling.   Neck: Supple, thyroid normal. No bruits.  No cervical adenopathy Respiratory: Respiratory effort normal, Breath sounds clear A&P without wheeze, rhonchi, rales.  Cardio: RRR without murmurs, rubs or gallops. Brisk peripheral pulses without edema.  Chest: symmetric, with normal excursions Breasts: defer  Abdomen: Soft, nontender, no guarding, rebound, hernias, masses, or organomegaly.  Lymphatics: Non tender without lymphadenopathy.  Musculoskeletal: Full ROM all peripheral extremities,5/5 strength, and normal gait.  Skin: Warm, dry without rashes, lesions, ecchymosis. Neuro: Awake and oriented X 3, Cranial nerves intact, reflexes equal bilaterally. Normal muscle tone, no cerebellar symptoms. Sensation intact.  Psych:  normal affect, Insight and Judgment appropriate.   EKG: NSR- NO ST Changes FAMILY HISTORY CAD IN 80'S    Warnell Rasnic Mikki Santee, NP 9:18 AM [facility]

## 2022-12-11 ENCOUNTER — Encounter: Payer: Self-pay | Admitting: Nurse Practitioner

## 2022-12-11 ENCOUNTER — Ambulatory Visit (INDEPENDENT_AMBULATORY_CARE_PROVIDER_SITE_OTHER): Payer: BC Managed Care – PPO | Admitting: Nurse Practitioner

## 2022-12-11 VITALS — BP 122/72 | HR 79 | Temp 97.9°F | Ht 60.5 in | Wt 132.6 lb

## 2022-12-11 DIAGNOSIS — N61 Mastitis without abscess: Secondary | ICD-10-CM

## 2022-12-11 DIAGNOSIS — Z1329 Encounter for screening for other suspected endocrine disorder: Secondary | ICD-10-CM

## 2022-12-11 DIAGNOSIS — I700 Atherosclerosis of aorta: Secondary | ICD-10-CM

## 2022-12-11 DIAGNOSIS — Z131 Encounter for screening for diabetes mellitus: Secondary | ICD-10-CM

## 2022-12-11 DIAGNOSIS — E559 Vitamin D deficiency, unspecified: Secondary | ICD-10-CM | POA: Diagnosis not present

## 2022-12-11 DIAGNOSIS — Z13220 Encounter for screening for lipoid disorders: Secondary | ICD-10-CM

## 2022-12-11 DIAGNOSIS — Z0000 Encounter for general adult medical examination without abnormal findings: Secondary | ICD-10-CM

## 2022-12-11 DIAGNOSIS — I10 Essential (primary) hypertension: Secondary | ICD-10-CM

## 2022-12-11 DIAGNOSIS — Z136 Encounter for screening for cardiovascular disorders: Secondary | ICD-10-CM | POA: Diagnosis not present

## 2022-12-11 DIAGNOSIS — K219 Gastro-esophageal reflux disease without esophagitis: Secondary | ICD-10-CM

## 2022-12-11 DIAGNOSIS — Z79899 Other long term (current) drug therapy: Secondary | ICD-10-CM

## 2022-12-11 DIAGNOSIS — E782 Mixed hyperlipidemia: Secondary | ICD-10-CM

## 2022-12-11 DIAGNOSIS — E663 Overweight: Secondary | ICD-10-CM

## 2022-12-11 DIAGNOSIS — Z1389 Encounter for screening for other disorder: Secondary | ICD-10-CM

## 2022-12-11 DIAGNOSIS — R7309 Other abnormal glucose: Secondary | ICD-10-CM

## 2022-12-11 DIAGNOSIS — Z0001 Encounter for general adult medical examination with abnormal findings: Secondary | ICD-10-CM

## 2022-12-11 DIAGNOSIS — N951 Menopausal and female climacteric states: Secondary | ICD-10-CM

## 2022-12-11 DIAGNOSIS — F3341 Major depressive disorder, recurrent, in partial remission: Secondary | ICD-10-CM

## 2022-12-11 NOTE — Patient Instructions (Signed)

## 2022-12-13 LAB — COMPLETE METABOLIC PANEL WITH GFR
AG Ratio: 1.7 (calc) (ref 1.0–2.5)
ALT: 17 U/L (ref 6–29)
AST: 19 U/L (ref 10–35)
Albumin: 4.4 g/dL (ref 3.6–5.1)
Alkaline phosphatase (APISO): 44 U/L (ref 37–153)
BUN: 16 mg/dL (ref 7–25)
CO2: 29 mmol/L (ref 20–32)
Calcium: 9.9 mg/dL (ref 8.6–10.4)
Chloride: 101 mmol/L (ref 98–110)
Creat: 0.91 mg/dL (ref 0.50–1.05)
Globulin: 2.6 g/dL (calc) (ref 1.9–3.7)
Glucose, Bld: 108 mg/dL (ref 65–139)
Potassium: 4.1 mmol/L (ref 3.5–5.3)
Sodium: 140 mmol/L (ref 135–146)
Total Bilirubin: 0.4 mg/dL (ref 0.2–1.2)
Total Protein: 7 g/dL (ref 6.1–8.1)
eGFR: 70 mL/min/{1.73_m2} (ref >=60)

## 2022-12-13 LAB — CBC WITH DIFFERENTIAL/PLATELET
Absolute Monocytes: 531 cells/uL (ref 200–950)
Basophils Absolute: 66 cells/uL (ref 0–200)
Basophils Relative: 0.8 %
Eosinophils Absolute: 191 cells/uL (ref 15–500)
Eosinophils Relative: 2.3 %
HCT: 38.9 % (ref 35.0–45.0)
Hemoglobin: 13.1 g/dL (ref 11.7–15.5)
Lymphs Abs: 4009 cells/uL — ABNORMAL HIGH (ref 850–3900)
MCH: 32.1 pg (ref 27.0–33.0)
MCHC: 33.7 g/dL (ref 32.0–36.0)
MCV: 95.3 fL (ref 80.0–100.0)
MPV: 10.8 fL (ref 7.5–12.5)
Monocytes Relative: 6.4 %
Neutro Abs: 3503 cells/uL (ref 1500–7800)
Neutrophils Relative %: 42.2 %
Platelets: 353 10*3/uL (ref 140–400)
RBC: 4.08 10*6/uL (ref 3.80–5.10)
RDW: 12.9 % (ref 11.0–15.0)
Total Lymphocyte: 48.3 %
WBC: 8.3 10*3/uL (ref 3.8–10.8)

## 2022-12-13 LAB — LIPID PANEL
Cholesterol: 197 mg/dL (ref <200)
HDL: 56 mg/dL (ref >=50)
LDL Cholesterol (Calc): 112 mg/dL (calc) — ABNORMAL HIGH
Non-HDL Cholesterol (Calc): 141 mg/dL (calc) — ABNORMAL HIGH (ref <130)
Total CHOL/HDL Ratio: 3.5 (calc) (ref <5.0)
Triglycerides: 169 mg/dL — ABNORMAL HIGH (ref <150)

## 2022-12-13 LAB — URINALYSIS W MICROSCOPIC + REFLEX CULTURE
Bilirubin Urine: NEGATIVE
Glucose, UA: NEGATIVE
Hgb urine dipstick: NEGATIVE
Hyaline Cast: NONE SEEN /LPF
Ketones, ur: NEGATIVE
Nitrites, Initial: NEGATIVE
Protein, ur: NEGATIVE
RBC / HPF: NONE SEEN /HPF (ref 0–2)
Specific Gravity, Urine: 1.014 (ref 1.001–1.035)
pH: 6 (ref 5.0–8.0)

## 2022-12-13 LAB — MICROALBUMIN / CREATININE URINE RATIO
Creatinine, Urine: 74 mg/dL (ref 20–275)
Microalb Creat Ratio: 3 mcg/mg creat (ref <30)
Microalb, Ur: 0.2 mg/dL

## 2022-12-13 LAB — TSH: TSH: 1.17 mIU/L (ref 0.40–4.50)

## 2022-12-13 LAB — HEMOGLOBIN A1C
Hgb A1c MFr Bld: 6 % of total Hgb — ABNORMAL HIGH (ref <5.7)
Mean Plasma Glucose: 126 mg/dL
eAG (mmol/L): 7 mmol/L

## 2022-12-13 LAB — URINE CULTURE
MICRO NUMBER:: 14390587
Result:: NO GROWTH
SPECIMEN QUALITY:: ADEQUATE

## 2022-12-13 LAB — VITAMIN D 25 HYDROXY (VIT D DEFICIENCY, FRACTURES): Vit D, 25-Hydroxy: 33 ng/mL (ref 30–100)

## 2022-12-13 LAB — MAGNESIUM: Magnesium: 2.1 mg/dL (ref 1.5–2.5)

## 2022-12-13 LAB — CULTURE INDICATED

## 2022-12-15 ENCOUNTER — Other Ambulatory Visit: Payer: Self-pay | Admitting: Obstetrics and Gynecology

## 2022-12-15 DIAGNOSIS — N6310 Unspecified lump in the right breast, unspecified quadrant: Secondary | ICD-10-CM

## 2022-12-17 ENCOUNTER — Other Ambulatory Visit: Payer: Self-pay

## 2022-12-17 ENCOUNTER — Other Ambulatory Visit: Payer: Self-pay | Admitting: Nurse Practitioner

## 2022-12-17 DIAGNOSIS — E782 Mixed hyperlipidemia: Secondary | ICD-10-CM

## 2022-12-18 ENCOUNTER — Ambulatory Visit
Admission: RE | Admit: 2022-12-18 | Discharge: 2022-12-18 | Disposition: A | Discharge status: Home / Self Care | Payer: BC Managed Care – PPO | Source: Ambulatory Visit | Attending: Obstetrics and Gynecology | Admitting: Obstetrics and Gynecology

## 2022-12-18 ENCOUNTER — Ambulatory Visit: Admission: RE | Admit: 2022-12-18 | Payer: BC Managed Care – PPO | Source: Ambulatory Visit

## 2022-12-18 DIAGNOSIS — R928 Other abnormal and inconclusive findings on diagnostic imaging of breast: Secondary | ICD-10-CM | POA: Diagnosis not present

## 2022-12-18 DIAGNOSIS — N6310 Unspecified lump in the right breast, unspecified quadrant: Secondary | ICD-10-CM

## 2023-01-29 DIAGNOSIS — M1991 Primary osteoarthritis, unspecified site: Secondary | ICD-10-CM | POA: Diagnosis not present

## 2023-01-29 DIAGNOSIS — R5383 Other fatigue: Secondary | ICD-10-CM | POA: Diagnosis not present

## 2023-01-29 DIAGNOSIS — R238 Other skin changes: Secondary | ICD-10-CM | POA: Diagnosis not present

## 2023-01-29 DIAGNOSIS — T691XXA Chilblains, initial encounter: Secondary | ICD-10-CM | POA: Diagnosis not present

## 2023-02-11 ENCOUNTER — Other Ambulatory Visit: Payer: Self-pay | Admitting: Podiatry

## 2023-02-11 ENCOUNTER — Ambulatory Visit (INDEPENDENT_AMBULATORY_CARE_PROVIDER_SITE_OTHER): Payer: BC Managed Care – PPO

## 2023-02-11 ENCOUNTER — Ambulatory Visit (INDEPENDENT_AMBULATORY_CARE_PROVIDER_SITE_OTHER): Payer: BC Managed Care – PPO | Admitting: Podiatry

## 2023-02-11 DIAGNOSIS — M7751 Other enthesopathy of right foot: Secondary | ICD-10-CM

## 2023-02-11 DIAGNOSIS — M79672 Pain in left foot: Secondary | ICD-10-CM

## 2023-02-11 DIAGNOSIS — M2041 Other hammer toe(s) (acquired), right foot: Secondary | ICD-10-CM

## 2023-02-11 MED ORDER — TRIAMCINOLONE ACETONIDE 10 MG/ML IJ SUSP
10.0000 mg | Freq: Once | INTRAMUSCULAR | Status: AC
  Administered 2023-02-11: 10 mg

## 2023-02-11 NOTE — Progress Notes (Signed)
Subjective:   Patient ID: Norma Cameron, female   DOB: 67 y.o.   MRN: [id number]   HPI Patient presents stating she developed a lot of pain between the third and fourth toes on the right foot and has had blood work done which was negative.  States that it is inflamed and sore and she does not remember injury or change in shoe gear  ROS      Objective:  Physical Exam  Neurovascular status intact inflammation between the third and fourth toes right with fluid buildup around the proximal phalanx interphalangeal joint digit 3 right lateral side against the fourth toe     Assessment:  Inflammatory capsulitis with digital deformity possible bone structural issue third and fourth digits     Plan:  H&P reviewed and went ahead today did sterile prep and injected the inner phalangeal joint right third digit 2 mg dexamethasone Kenalog 5 mg Xylocaine debrided lesion courtesy applied padding to reduce pressure reappoint to recheck if symptoms indicate may ultimately require arthroplasty procedure  X-rays indicate that there is pressure between third and fourth toes right slight spur formation third digit

## 2023-03-16 ENCOUNTER — Encounter: Payer: Self-pay | Admitting: Podiatry

## 2023-03-16 ENCOUNTER — Ambulatory Visit (INDEPENDENT_AMBULATORY_CARE_PROVIDER_SITE_OTHER): Payer: BC Managed Care – PPO | Admitting: Podiatry

## 2023-03-16 VITALS — BP 158/68 | HR 74 | Ht 61.0 in | Wt 132.0 lb

## 2023-03-16 DIAGNOSIS — M7751 Other enthesopathy of right foot: Secondary | ICD-10-CM | POA: Diagnosis not present

## 2023-03-17 NOTE — Progress Notes (Signed)
Subjective:   Patient ID: Norma Cameron, female   DOB: 67 y.o.   MRN: [id number]   HPI Patient states the third toe is doing better but now the fourth toe is bothersome at the joint surface   ROS      Objective:  Physical Exam  Ocular status intact with patient found to have inflammation of the distal interphalange heel joint digit 4 right medial side painful when pressed with keratotic tissue formation     Assessment:  Inflammatory capsulitis inner phalangeal joint digit 4 right     Plan:  H&P reviewed ultimately that this may require surgery but we will try conservative again I did sterile prep injected the inner phalangeal joint 1.5 mg dexamethasone Kenalog 2 mg Xylocaine debrided lesion applied cushioning reappoint as symptoms indicate signed this

## 2023-04-14 ENCOUNTER — Other Ambulatory Visit: Payer: Self-pay | Admitting: Nurse Practitioner

## 2023-04-14 DIAGNOSIS — E663 Overweight: Secondary | ICD-10-CM

## 2023-06-02 NOTE — Progress Notes (Signed)
 Follow up  Assessment and Plan:  Mixed hyperlipidemia Not taking rosuvastatin decrease fatty foods, continue diet increase activity.  -     CBC, CMP -     Lipid Profile  Recurrent major depressive disorder, in partial remission (HCC)/Anxiety Restart lexapro 10 mg Practice relaxation techniques, diet and exercise  Vitamin D deficiency Continue Vit D supplementation to maintain value in therapeutic level of 60-100   GERD Patient advised to avoid spicy, acidic, citrus, chocolate, mints, fruit and fruit juices.  Limit the intake of caffeine, alcohol and Soda.  Don't exercise too soon after eating.  Don't lie down within 3-4 hours of eating.  Elevate the head of your bed. Continues on Prilosec   Medication management -     CBC with Diff -     COMPLETE METABOLIC PANEL WITH GFR   Abnormal Glucose Continue diet and exercise - Insulin, Random  Overweight Long discussion about weight loss, diet, and exercise Recommended diet heavy in fruits and veggies and low in animal meats, cheeses, and dairy products, appropriate calorie intake Patient will work on increasing activity, limit sweets, increase protein Follow up at next visit  Arthritis Previous positive ANA and CRP Was previously evaluated by rheumatology Continue to follow with podiatry Alternate Ibuprofen and Tylenol 8am -12pm -4pm -8pm and monitor symptoms    Discussed med's effects and SE's. Screening labs and tests as requested with regular follow-up as recommended. Future Appointments  Date Time Provider Department Center  12/14/2023  2:00 PM Raynelle Dick, NP [facility] None    HPI  67 y.o. female  presents for follow up for chol, vitamin D def., joint pain   Her blood pressure has been controlled at home, today their BP is BP: 128/78.   BP Readings from Last 3 Encounters:  06/03/23 128/78  03/16/23 (!) 158/68  12/11/22 122/72  She does workout, she is walking/riding bikes. She denies chest pain, shortness  of breath, dizziness.   She has been noticing pain in both of her feet.  Denies numbness and tingling.  Previously evaluated by rheumatology. She is evaluated by podiatry- had cortisone injection right 4 th toe for inflammatory capsulitis inner phalangeal joint. She wears supportive shoes. She is having pain in feet, hands, knees and lower back.  She does use Ibuprofen which lessens the pain.   BMI is Body mass index is 25.47 kg/m., she is working on diet and exercise. She has not been doing much exercise- has no energy. She is eating regular meals- has noticed a larger appetite and craves sweets.  Wt Readings from Last 3 Encounters:  06/03/23 132 lb 9.6 oz (60.1 kg)  03/16/23 132 lb (59.9 kg)  12/11/22 132 lb 9.6 oz (60.1 kg)   She works with [employer]- [employer] 2 days a week.   She was previously on lexapro only on 10mg  a day but ran out of prescription.  Lexapro does work well for her anxiety and does wish to restart. She takes xanax 0.5 mg 1/2 very rarely per patient.    She was prescribed Rosuvastatin 5 mg daily but is not taking. She is limiting red meat, dairy, eggs and fried foods. She has GF who had  heart disease late 11's. Her cholesterol is not at goal. The cholesterol last visit was:  Lab Results  Component Value Date   CHOL 197 12/11/2022   HDL 56 12/11/2022   LDLCALC 112 (H) 12/11/2022   TRIG 169 (H) 12/11/2022   CHOLHDL 3.5 12/11/2022  .  She has been working on diet and exercise for prediabetes, she is on bASA, she is not on ACE/ARB and denies foot ulcerations, hyperglycemia, hypoglycemia , increased appetite, nausea, paresthesia of the feet, polydipsia, polyuria, visual disturbances, vomiting and weight loss. Last A1C in the office was:  Lab Results  Component Value Date   HGBA1C 6.0 (H) 12/11/2022   Patient is on multivitamin , not taking a vit D supplement.  Lab Results  Component Value Date   VD25OH 33 12/11/2022     Immunization History   Administered Date(s) Administered   Janssen (J&J) SARS-COV-2 Vaccination 02/17/2020   Tdap 02/04/2008, 08/03/2018   Zoster, Live 12/28/2012    Current Medications:  Current Outpatient Medications on File Prior to Visit  Medication Sig Dispense Refill   ALPRAZolam (XANAX) 0.5 MG tablet Take 1 tablet (0.5 mg total) by mouth 2 (two) times daily. AS NEEDED RARELY TAKES PER PT 45 tablet 0   cetirizine (ZYRTEC) 10 MG chewable tablet Chew 10 mg by mouth daily.     diphenhydrAMINE HCl (BENADRYL PO) Take by mouth.     IBUPROFEN PO Take by mouth. Taking 6 a day     Magnesium 400 MG TABS Take 400 mg by mouth daily. CURRENLTY 30 MG DAILY     Multiple Vitamins-Minerals (ONE A DAY WOMEN 50 PLUS) CHEW as directed Orally     omeprazole (PRILOSEC) 40 MG capsule TAKE 1 CAPSULE BY MOUTH EVERY DAY TO PREVENT HEARTBURN & INDIGESTION 90 capsule 3   zolpidem (AMBIEN) 10 MG tablet Take 10 mg by mouth at bedtime as needed for sleep.     escitalopram (LEXAPRO) 20 MG tablet Take 20 mg by mouth daily. (Patient not taking: Reported on 06/03/2023)     phentermine (ADIPEX-P) 37.5 MG tablet TAKE 1/2 TO 1 TABLET EVERY MORNING FOR DIETING & WEIGHTLOSS (Patient not taking: Reported on 06/03/2023) 30 tablet 0   Probiotic Product (PROBIOTIC DAILY PO) Take by mouth daily. (Patient not taking: Reported on 06/03/2023)     rosuvastatin (CRESTOR) 5 MG tablet TAKE 1 TABLET (5 MG TOTAL) BY MOUTH DAILY. (Patient not taking: Reported on 06/03/2023) 30 tablet 11   No current facility-administered medications on file prior to visit.   Medical History:  Past Medical History:  Diagnosis Date   Allergy    Anxiety    Atypical nevus 04/13/2002   Left Chest-Slight to Moderate edges free   BCC (basal cell carcinoma of skin) 10/07/2011   Right Inner Shin   Depression    CONTROLLED WITH OCCASIONAL XANAX   Fibrocystic breast disease    GERD (gastroesophageal reflux disease)    Hyperlipidemia    Osteopenia    Prediabetes 09/05/2014    Vitamin D deficiency    Allergies Allergies  Allergen Reactions   Acyclovir And Related    Egg-Derived Products Hives    Patient states she eats eggs with no problem.  Received propofol with no reaction.    Monascus Purpureus Went Yeast Other (See Comments)    MYALGIA with higher doses   Neosporin [Neomycin-Bacitracin Zn-Polymyx] Hives   Penicillins Other (See Comments)   Red Yeast Rice [Cholestin] Other (See Comments)    MYALGIA with higher doses   Sulfa Antibiotics Hives   Zostavax [Zoster Vaccine Live] Rash    SURGICAL HISTORY She  has a past surgical history that includes Dilation and curettage of uterus (2006); Novasure ablation (2006); Cervix lesion destruction; Skin cancer excision (Right, 10/2011); Carpal tunnel release (Right, 12/06/2015); Hernia repair; Bladder suspension (N/A,  09/10/2020); and Cystoscopy (N/A, 09/10/2020). FAMILY HISTORY Her family history includes Cancer in her paternal uncle; Diabetes in her paternal grandfather; Heart disease in her father and paternal grandfather; Hyperlipidemia in her father and mother; Hypertension in her father and mother; Rheum arthritis in her mother. SOCIAL HISTORY She  reports that she quit smoking about 33 years ago. Her smoking use included cigarettes. She has never used smokeless tobacco. She reports current alcohol use. She reports that she does not use drugs.'  Review of Systems: Review of Systems  Constitutional:  Negative for chills, fever and malaise/fatigue.  HENT:  Negative for congestion, ear pain and sore throat.   Respiratory:  Negative for cough, shortness of breath and wheezing.   Cardiovascular:  Negative for chest pain, palpitations and leg swelling.  Gastrointestinal:  Negative for abdominal pain, blood in stool, constipation, diarrhea, heartburn and melena.  Genitourinary: Negative.   Musculoskeletal:  Positive for back pain (lumbar) and joint pain (feet , hands, knees).  Skin: Negative.   Neurological:   Negative for dizziness, sensory change, loss of consciousness and headaches.  Psychiatric/Behavioral:  Negative for depression. The patient is not nervous/anxious and does not have insomnia.     Physical Exam: Estimated body mass index is 25.47 kg/m as calculated from the following:   Height as of this encounter: 5' 0.5" (1.537 m).   Weight as of this encounter: 132 lb 9.6 oz (60.1 kg). BP 128/78   Pulse 82   Temp 97.7 F (36.5 C)   Ht 5' 0.5" (1.537 m)   Wt 132 lb 9.6 oz (60.1 kg)   SpO2 97%   BMI 25.47 kg/m   General Appearance: Well nourished well developed, in no apparent distress.  Eyes: PERRLA, EOMs, conjunctiva no swelling or erythema ENT/Mouth: Ear canals normal without obstruction, swelling, erythema, or discharge.  TMs normal bilaterally with no erythema, bulging.  Oropharynx moist and clear with no exudate, erythema, or swelling.   Neck: Supple, thyroid normal. No bruits.  No cervical adenopathy Respiratory: Respiratory effort normal, Breath sounds clear A&P without wheeze, rhonchi, rales.   Cardio: RRR without murmurs, rubs or gallops. Brisk peripheral pulses without edema.  Chest: symmetric, with normal excursions .  Musculoskeletal: Full ROM all peripheral extremities,5/5 strength, and normal gait.  Skin: Warm, dry without rashes, lesions, ecchymosis. Neuro: Awake and oriented X 3, Cranial nerves intact. Normal muscle tone, no cerebellar symptoms. Sensation intact.  Psych:  normal affect, Insight and Judgment appropriate.    Victorio Creeden E  11:28 AM [place]

## 2023-06-03 ENCOUNTER — Ambulatory Visit: Payer: 59 | Admitting: Nurse Practitioner

## 2023-06-03 ENCOUNTER — Encounter: Payer: Self-pay | Admitting: Nurse Practitioner

## 2023-06-03 VITALS — BP 128/78 | HR 82 | Temp 97.7°F | Ht 60.5 in | Wt 132.6 lb

## 2023-06-03 DIAGNOSIS — K219 Gastro-esophageal reflux disease without esophagitis: Secondary | ICD-10-CM

## 2023-06-03 DIAGNOSIS — E559 Vitamin D deficiency, unspecified: Secondary | ICD-10-CM | POA: Diagnosis not present

## 2023-06-03 DIAGNOSIS — R7309 Other abnormal glucose: Secondary | ICD-10-CM | POA: Diagnosis not present

## 2023-06-03 DIAGNOSIS — F3341 Major depressive disorder, recurrent, in partial remission: Secondary | ICD-10-CM

## 2023-06-03 DIAGNOSIS — E782 Mixed hyperlipidemia: Secondary | ICD-10-CM | POA: Diagnosis not present

## 2023-06-03 DIAGNOSIS — M129 Arthropathy, unspecified: Secondary | ICD-10-CM

## 2023-06-03 DIAGNOSIS — E663 Overweight: Secondary | ICD-10-CM

## 2023-06-03 DIAGNOSIS — Z79899 Other long term (current) drug therapy: Secondary | ICD-10-CM

## 2023-06-03 DIAGNOSIS — F419 Anxiety disorder, unspecified: Secondary | ICD-10-CM

## 2023-06-03 MED ORDER — ESCITALOPRAM OXALATE 10 MG PO TABS
10.0000 mg | ORAL_TABLET | Freq: Every day | ORAL | 2 refills | Status: DC
Start: 2023-06-03 — End: 2023-06-30

## 2023-06-03 NOTE — Patient Instructions (Signed)
Alternate Ibuprofen and Tylenol  Arthritis Arthritis is a term that is commonly used to refer to joint pain or joint disease. There are more than 100 types of arthritis. What are the causes? The most common cause of this condition is wear and tear of a joint. Other causes include: Gout. Inflammation of a joint. An infection of a joint. Sprains and other injuries near the joint. A reaction to medicines or drugs, or an allergic reaction. In some cases, the cause may not be known. What are the signs or symptoms? The main symptom of this condition is pain in the joint during movement. Other symptoms include: Redness, swelling, or stiffness at a joint. Warmth coming from the joint. Fever. Overall feeling of illness. How is this diagnosed? This condition may be diagnosed with a physical exam and tests, including: Blood tests. Urine tests. Imaging tests, such as X-rays, an MRI, or a CT scan. Sometimes, fluid is removed from a joint for testing. How is this treated? This condition may be treated with: Treatment of the cause, if it is known. Rest. Raising (elevating) the joint. Applying cold or hot packs to the joint. Medicines to improve symptoms and reduce inflammation. Injections of a steroid, such as cortisone, into the joint to help reduce pain and inflammation. Depending on the cause of your arthritis, you may need to make lifestyle changes to reduce stress on your joint. Changes may include: Exercising more. Losing weight. Follow these instructions at home: Medicines Take over-the-counter and prescription medicines only as told by your health care provider. Do not take aspirin to relieve pain if your health care provider thinks that gout may be causing your pain. Activity Rest your joint if told by your health care provider. Rest is important when your disease is active and your joint feels painful, swollen, or stiff. Avoid activities that make the pain worse. Balance activity  with rest. Exercise your joint regularly with range-of-motion exercises as told by your health care provider. Try doing low-impact exercise, such as: Swimming. Water aerobics. Biking. Walking. Managing pain, stiffness, and swelling     If directed, put ice on the affected joint. To do this: Put ice in a plastic bag. Place a towel between your skin and the bag. Leave the ice on for 20 minutes, 2-3 times a day. Remove the ice if your skin turns bright red. This is very important. If you cannot feel pain, heat, or cold, you have a greater risk of damage to the area. If your joint is swollen, raise (elevate) it above the level of your heart if directed by your health care provider. If your joint feels stiff in the morning, try taking a warm shower. If directed, apply heat to the affected area as often as told by your health care provider. Use the heat source that your health care provider recommends, such as a moist heat pack or a heating pad. To apply heat: Place a towel between your skin and the heat source. Leave the heat on for 20-30 minutes. Remove the heat if your skin turns bright red. This is especially important if you are unable to feel pain, heat, or cold. You have a greater risk of getting burned. General instructions Maintain a healthy weight. Follow instructions from your health care provider for weight control. Do not use any products that contain nicotine or tobacco. These products include cigarettes, chewing tobacco, and vaping devices, such as e-cigarettes. If you need help quitting, ask your health care provider. Keep all follow-up  visits. This is important. Where to find more information [employer]: www.niams.http://www.myers.net/ Contact a health care provider if: The pain gets worse. You have a fever. Get help right away if: You develop severe joint pain, swelling, or redness. Many joints become painful and swollen. You develop severe back pain. You develop  severe weakness in your leg. Summary Arthritis is a term that is commonly used to refer to joint pain or joint disease. There are more than 100 types of arthritis. The most common cause of this condition is wear and tear of a joint. Other causes include gout, inflammation or infection of the joint, sprains, or allergies. Symptoms of this condition include redness, swelling, or stiffness of the joint. Other symptoms include warmth, fever, or feeling ill. This condition is treated with rest, elevation, medicines, and applying cold or hot packs. Follow your health care provider's instructions about medicines, activity, exercises, and other home care treatments. This information is not intended to replace advice given to you by your health care provider. Make sure you discuss any questions you have with your health care provider. Document Revised: 09/03/2021 Document Reviewed: 09/03/2021 Elsevier Patient Education  2024 [employer].

## 2023-06-04 LAB — COMPLETE METABOLIC PANEL WITH GFR
AG Ratio: 2 (calc) (ref 1.0–2.5)
ALT: 14 U/L (ref 6–29)
AST: 15 U/L (ref 10–35)
Albumin: 4.4 g/dL (ref 3.6–5.1)
Alkaline phosphatase (APISO): 48 U/L (ref 37–153)
BUN: 25 mg/dL (ref 7–25)
CO2: 28 mmol/L (ref 20–32)
Calcium: 9.8 mg/dL (ref 8.6–10.4)
Chloride: 103 mmol/L (ref 98–110)
Creat: 0.8 mg/dL (ref 0.50–1.05)
Globulin: 2.2 g/dL (calc) (ref 1.9–3.7)
Glucose, Bld: 96 mg/dL (ref 65–99)
Potassium: 4.9 mmol/L (ref 3.5–5.3)
Sodium: 139 mmol/L (ref 135–146)
Total Bilirubin: 0.4 mg/dL (ref 0.2–1.2)
Total Protein: 6.6 g/dL (ref 6.1–8.1)
eGFR: 82 mL/min/{1.73_m2} (ref >=60)

## 2023-06-04 LAB — CBC WITH DIFFERENTIAL/PLATELET
Absolute Monocytes: 593 cells/uL (ref 200–950)
Basophils Absolute: 77 cells/uL (ref 0–200)
Basophils Relative: 1 %
Eosinophils Absolute: 177 cells/uL (ref 15–500)
Eosinophils Relative: 2.3 %
HCT: 38.8 % (ref 35.0–45.0)
Hemoglobin: 12.9 g/dL (ref 11.7–15.5)
Lymphs Abs: 2479 cells/uL (ref 850–3900)
MCH: 31.9 pg (ref 27.0–33.0)
MCHC: 33.2 g/dL (ref 32.0–36.0)
MCV: 96 fL (ref 80.0–100.0)
MPV: 10.7 fL (ref 7.5–12.5)
Monocytes Relative: 7.7 %
Neutro Abs: 4374 cells/uL (ref 1500–7800)
Neutrophils Relative %: 56.8 %
Platelets: 282 10*3/uL (ref 140–400)
RBC: 4.04 10*6/uL (ref 3.80–5.10)
RDW: 12.8 % (ref 11.0–15.0)
Total Lymphocyte: 32.2 %
WBC: 7.7 10*3/uL (ref 3.8–10.8)

## 2023-06-04 LAB — LIPID PANEL
Cholesterol: 209 mg/dL — ABNORMAL HIGH (ref <200)
HDL: 66 mg/dL (ref >=50)
LDL Cholesterol (Calc): 117 mg/dL (calc) — ABNORMAL HIGH
Non-HDL Cholesterol (Calc): 143 mg/dL (calc) — ABNORMAL HIGH (ref <130)
Total CHOL/HDL Ratio: 3.2 (calc) (ref <5.0)
Triglycerides: 145 mg/dL (ref <150)

## 2023-06-04 LAB — INSULIN, RANDOM: Insulin: 15.9 u[IU]/mL

## 2023-06-18 ENCOUNTER — Ambulatory Visit: Payer: 59 | Admitting: Nurse Practitioner

## 2023-06-30 ENCOUNTER — Other Ambulatory Visit: Payer: Self-pay | Admitting: Nurse Practitioner

## 2023-06-30 DIAGNOSIS — F419 Anxiety disorder, unspecified: Secondary | ICD-10-CM

## 2023-07-14 ENCOUNTER — Other Ambulatory Visit: Payer: Self-pay | Admitting: Nurse Practitioner

## 2023-07-14 DIAGNOSIS — K219 Gastro-esophageal reflux disease without esophagitis: Secondary | ICD-10-CM

## 2023-07-15 DIAGNOSIS — D2372 Other benign neoplasm of skin of left lower limb, including hip: Secondary | ICD-10-CM | POA: Diagnosis not present

## 2023-07-15 DIAGNOSIS — D225 Melanocytic nevi of trunk: Secondary | ICD-10-CM | POA: Diagnosis not present

## 2023-07-15 DIAGNOSIS — D485 Neoplasm of uncertain behavior of skin: Secondary | ICD-10-CM | POA: Diagnosis not present

## 2023-07-15 DIAGNOSIS — Z1283 Encounter for screening for malignant neoplasm of skin: Secondary | ICD-10-CM | POA: Diagnosis not present

## 2023-08-05 DIAGNOSIS — H2513 Age-related nuclear cataract, bilateral: Secondary | ICD-10-CM | POA: Diagnosis not present

## 2023-09-03 DIAGNOSIS — G4700 Insomnia, unspecified: Secondary | ICD-10-CM | POA: Diagnosis not present

## 2023-09-03 DIAGNOSIS — M8580 Other specified disorders of bone density and structure, unspecified site: Secondary | ICD-10-CM | POA: Diagnosis not present

## 2023-09-22 DIAGNOSIS — M545 Low back pain, unspecified: Secondary | ICD-10-CM | POA: Diagnosis not present

## 2023-10-20 NOTE — Progress Notes (Unsigned)
Assessment and Plan:  There are no diagnoses linked to this encounter.    Further disposition pending results of labs. Discussed med's effects and SE's.   Over 30 minutes of exam, counseling, chart review, and critical decision making was performed.   Future Appointments  Date Time Provider Department Center  10/21/2023  4:00 PM Raynelle Dick, NP [facility] None  12/16/2023 10:00 AM Raynelle Dick, NP [facility] None    ------------------------------------------------------------------------------------------------------------------   HPI There were no vitals taken for this visit. 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  Received propofol with no reaction.    Monascus Purpureus Went Yeast Other (See Comments)    MYALGIA with higher doses   Neosporin [Neomycin-Bacitracin Zn-Polymyx] Hives   Penicillins Other (See Comments)   Red Yeast Rice [Cholestin] Other (See Comments)    MYALGIA with higher doses   Sulfa Antibiotics Hives   Zostavax [Zoster Vaccine Live] Rash    Current Outpatient Medications on File Prior to Visit  Medication Sig   ALPRAZolam (XANAX) 0.5 MG tablet Take 1 tablet (0.5 mg total) by mouth 2 (two) times daily. AS NEEDED RARELY TAKES PER PT   cetirizine (ZYRTEC) 10 MG chewable tablet Chew 10 mg by mouth daily.   diphenhydrAMINE HCl (BENADRYL PO) Take by mouth.   escitalopram (LEXAPRO) 10 MG tablet TAKE 1 TABLET BY MOUTH  EVERY DAY   IBUPROFEN PO Take by mouth. Taking 6 a day   Magnesium 400 MG TABS Take 400 mg by mouth daily. CURRENLTY 30 MG DAILY   Multiple Vitamins-Minerals (ONE A DAY WOMEN 50 PLUS) CHEW as directed Orally   omeprazole (PRILOSEC) 40 MG capsule TAKE 1 CAPSULE BY MOUTH EVERY DAY TO PREVENT HEARTBURN & INDIGESTION   phentermine (ADIPEX-P) 37.5 MG tablet TAKE 1/2 TO 1 TABLET EVERY MORNING FOR DIETING & WEIGHTLOSS (Patient not taking: Reported on 06/03/2023)   Probiotic Product (PROBIOTIC DAILY PO) Take by mouth daily. (Patient not taking: Reported on 06/03/2023)   rosuvastatin (CRESTOR) 5 MG tablet TAKE 1 TABLET (5 MG TOTAL) BY MOUTH DAILY. (Patient not taking: Reported on 06/03/2023)   zolpidem (AMBIEN) 10 MG tablet Take 10 mg by mouth at bedtime as needed for sleep.   No current facility-administered medications on file prior to visit.    ROS: all negative except above.   Physical Exam:  There were no vitals taken for this visit.  General Appearance: Well nourished, in no apparent distress. Eyes: PERRLA, EOMs, conjunctiva no swelling or erythema Sinuses: No Frontal/maxillary tenderness ENT/Mouth: Ext aud canals clear, TMs without erythema, bulging. No erythema, swelling, or exudate on post pharynx.  Tonsils not swollen or erythematous. Hearing normal.  Neck: Supple, thyroid normal.  Respiratory: Respiratory effort normal, BS equal bilaterally without rales, rhonchi, wheezing or stridor.  Cardio: RRR with no MRGs. Brisk peripheral pulses without edema.  Abdomen: Soft, + BS.  Non tender, no guarding, rebound, hernias, masses. Lymphatics: Non tender without lymphadenopathy.  Musculoskeletal: Full ROM, 5/5 strength, normal gait.  Skin: Warm, dry without rashes, lesions, ecchymosis.  Neuro: Cranial nerves intact. Normal muscle tone, no cerebellar symptoms. Sensation intact.  Psych: Awake and oriented X 3, normal affect, Insight and Judgment appropriate.     Raynelle Dick, NP 12:30  PM [facility]

## 2023-10-21 ENCOUNTER — Other Ambulatory Visit: Payer: Self-pay | Admitting: Nurse Practitioner

## 2023-10-21 ENCOUNTER — Encounter: Payer: Self-pay | Admitting: Nurse Practitioner

## 2023-10-21 ENCOUNTER — Ambulatory Visit (INDEPENDENT_AMBULATORY_CARE_PROVIDER_SITE_OTHER): Payer: Medicare Other | Admitting: Nurse Practitioner

## 2023-10-21 VITALS — BP 122/80 | HR 75 | Temp 97.5°F | Ht 60.5 in | Wt 129.2 lb

## 2023-10-21 DIAGNOSIS — M25551 Pain in right hip: Secondary | ICD-10-CM | POA: Diagnosis not present

## 2023-10-21 DIAGNOSIS — M545 Low back pain, unspecified: Secondary | ICD-10-CM

## 2023-10-21 DIAGNOSIS — M25552 Pain in left hip: Secondary | ICD-10-CM

## 2023-10-21 DIAGNOSIS — F3341 Major depressive disorder, recurrent, in partial remission: Secondary | ICD-10-CM | POA: Diagnosis not present

## 2023-10-21 MED ORDER — MELOXICAM 15 MG PO TBDP
15.0000 mg | ORAL_TABLET | Freq: Every day | ORAL | 0 refills | Status: DC
Start: 2023-10-21 — End: 2023-10-21

## 2023-10-21 MED ORDER — MELOXICAM 15 MG PO TABS: 15.0000 mg | ORAL_TABLET | Freq: Every day | ORAL | 0 refills | Status: DC

## 2023-10-21 NOTE — Addendum Note (Signed)
Addended by: Anda Kraft E on: 10/21/2023 04:30 PM   Modules accepted: Orders

## 2023-10-21 NOTE — Patient Instructions (Addendum)
Use heat Take Meloxicam 15 mg once a day- do not use any other ibuprofen with this medication Xrays at [facility] [address] Walk in M-F 8:30-3:45  Acute Back Pain, Adult Acute back pain is sudden and usually short-lived. It is often caused by an injury to the muscles and tissues in the back. The injury may result from: A muscle, tendon, or ligament getting overstretched or torn. Ligaments are tissues that connect bones to each other. Lifting something improperly can cause a back strain. Wear and tear (degeneration) of the spinal disks. Spinal disks are circular tissue that provide cushioning between the bones of the spine (vertebrae). Twisting motions, such as while playing sports or doing yard work. A hit to the back. Arthritis. You may have a physical exam, lab tests, and imaging tests to find the cause of your pain. Acute back pain usually goes away with rest and home care. Follow these instructions at home: Managing pain, stiffness, and swelling Take over-the-counter and prescription medicines only as told by your health care provider. Treatment may include medicines for pain and inflammation that are taken by mouth or applied to the skin, or muscle relaxants. Your health care provider may recommend applying ice during the first 24-48 hours after your pain starts. To do this: Put ice in a plastic bag. Place a towel between your skin and the bag. Leave the ice on for 20 minutes, 2-3 times a day. Remove the ice if your skin turns bright red. This is very important. If you cannot feel pain, heat, or cold, you have a greater risk of damage to the area. If directed, apply heat to the affected area as often as told by your health care provider. Use the heat source that your health care provider recommends, such as a moist heat pack or a heating pad. Place a towel between your skin and the heat source. Leave the heat on for 20-30 minutes. Remove the heat if your skin turns  bright red. This is especially important if you are unable to feel pain, heat, or cold. You have a greater risk of getting burned. Activity  Do not stay in bed. Staying in bed for more than 1-2 days can delay your recovery. Sit up and stand up straight. Avoid leaning forward when you sit or hunching over when you stand. If you work at a desk, sit close to it so you do not need to lean over. Keep your chin tucked in. Keep your neck drawn back, and keep your elbows bent at a 90-degree angle (right angle). Sit high and close to the steering wheel when you drive. Add lower back (lumbar) support to your car seat, if needed. Take short walks on even surfaces as soon as you are able. Try to increase the length of time you walk each day. Do not sit, drive, or stand in one place for more than 30 minutes at a time. Sitting or standing for long periods of time can put stress on your back. Do not drive or use heavy machinery while taking prescription pain medicine. Use proper lifting techniques. When you bend and lift, use positions that put less stress on your back: [place] your knees. Keep the load close to your body. Avoid twisting. Exercise regularly as told by your health care provider. Exercising helps your back heal faster and helps prevent back injuries by keeping muscles strong and flexible. Work with a physical therapist to make a safe exercise program, as recommended by your  health care provider. Do any exercises as told by your physical therapist. Lifestyle Maintain a healthy weight. Extra weight puts stress on your back and makes it difficult to have good posture. Avoid activities or situations that make you feel anxious or stressed. Stress and anxiety increase muscle tension and can make back pain worse. Learn ways to manage anxiety and stress, such as through exercise. General instructions Sleep on a firm mattress in a comfortable position. Try lying on your side with your knees slightly bent. If  you lie on your back, put a pillow under your knees. Keep your head and neck in a straight line with your spine (neutral position) when using electronic equipment like smartphones or pads. To do this: Raise your smartphone or pad to look at it instead of bending your head or neck to look down. Put the smartphone or pad at the level of your face while looking at the screen. Follow your treatment plan as told by your health care provider. This may include: Cognitive or behavioral therapy. Acupuncture or massage therapy. Meditation or yoga. Contact a health care provider if: You have pain that is not relieved with rest or medicine. You have increasing pain going down into your legs or buttocks. Your pain does not improve after 2 weeks. You have pain at night. You lose weight without trying. You have a fever or chills. You develop nausea or vomiting. You develop abdominal pain. Get help right away if: You develop new bowel or bladder control problems. You have unusual weakness or numbness in your arms or legs. You feel faint. These symptoms may represent a serious problem that is an emergency. Do not wait to see if the symptoms will go away. Get medical help right away. Call your local emergency services (911 in the [place] Do not drive yourself to the hospital. Summary Acute back pain is sudden and usually short-lived. Use proper lifting techniques. When you bend and lift, use positions that put less stress on your back. Take over-the-counter and prescription medicines only as told by your health care provider, and apply heat or ice as told. This information is not intended to replace advice given to you by your health care provider. Make sure you discuss any questions you have with your health care provider. Document Revised: 02/15/2021 Document Reviewed: 02/15/2021 Elsevier Patient Education  2024 [employer].

## 2023-10-23 ENCOUNTER — Ambulatory Visit
Admission: RE | Admit: 2023-10-23 | Discharge: 2023-10-23 | Disposition: A | Discharge status: Home / Self Care | Payer: Medicare Other | Source: Ambulatory Visit | Attending: Nurse Practitioner | Admitting: Nurse Practitioner

## 2023-10-23 ENCOUNTER — Other Ambulatory Visit: Payer: Self-pay | Admitting: Nurse Practitioner

## 2023-10-23 DIAGNOSIS — M25552 Pain in left hip: Secondary | ICD-10-CM

## 2023-10-23 DIAGNOSIS — B351 Tinea unguium: Secondary | ICD-10-CM | POA: Diagnosis not present

## 2023-10-23 DIAGNOSIS — D225 Melanocytic nevi of trunk: Secondary | ICD-10-CM | POA: Diagnosis not present

## 2023-10-23 DIAGNOSIS — M545 Low back pain, unspecified: Secondary | ICD-10-CM

## 2023-10-23 DIAGNOSIS — F3341 Major depressive disorder, recurrent, in partial remission: Secondary | ICD-10-CM

## 2023-10-23 DIAGNOSIS — Z1283 Encounter for screening for malignant neoplasm of skin: Secondary | ICD-10-CM | POA: Diagnosis not present

## 2023-10-23 DIAGNOSIS — M25551 Pain in right hip: Secondary | ICD-10-CM | POA: Diagnosis not present

## 2023-10-28 ENCOUNTER — Telehealth: Payer: Self-pay | Admitting: Nurse Practitioner

## 2023-10-28 NOTE — Telephone Encounter (Signed)
Patient wants to know to Meloxicam with an Acetaminophen?

## 2023-10-28 NOTE — Telephone Encounter (Signed)
She can take Acetaminophen with Meloxicam

## 2023-11-18 ENCOUNTER — Other Ambulatory Visit: Payer: Self-pay | Admitting: Nurse Practitioner

## 2023-11-18 DIAGNOSIS — M25552 Pain in left hip: Secondary | ICD-10-CM

## 2023-11-18 DIAGNOSIS — M545 Low back pain, unspecified: Secondary | ICD-10-CM

## 2023-12-14 ENCOUNTER — Encounter: Payer: Medicare Other | Admitting: Nurse Practitioner

## 2023-12-15 NOTE — Progress Notes (Signed)
 COMPLETE PHYSICAL  Assessment and Plan:  Encounter for general adult medical examination with abnormal findings -     CBC with Diff -     COMPLETE METABOLIC PANEL WITH GFR -     TSH -     Lipid Profile -     Hemoglobin A1c ([facility]) -     Magnesium -     Vitamin D  (25 hydroxy) -     Urinalysis with microscopic and reflex culture -     Microalbumin / Creatinine Urine Ratio CANNOT ACCESS MYCHART_- CALL WITH RESULTS  Mixed hyperlipidemia check lipids decrease fatty foods increase activity.  -     TSH -     Lipid Profile  Recurrent major depressive disorder, in partial remission (HCC)/Anxiety Continue lexapro  10 mg Xanax  0.5mg  twice a day as needed  Overweight Long discussion about weight loss, diet, and exercise Recommended diet heavy in fruits and veggies and low in animal meats, cheeses, and dairy products, appropriate calorie intake Patient will work on decreasing saturated fats, simple carbs and increasing activity Follow up at next visit  Mastitis of right breast Complete antibiotics and continue to follow with Dr. Tomblin  Vitamin D  deficiency Currently not taking Vit D supplement -     Vitamin D  (25 hydroxy)  Bilateral hip pain Continue Meloxicam  every other day, tylenol  PRN  and stretching/exercise  Climacteric Follows with Dr. Tomblin Currently off all hormones  Abnormal glucose -     Hemoglobin A1c ([facility]) Discussed disease progression and risks Discussed diet/exercise, weight management and risk modification  Screening for blood or protein in urine -     Urinalysis with Micorscopic and reflex culture -     Microalbumin / Creatinine Urine Ratio  Screening for ischemic heart disease EKG  Gastroesophageal reflux disease without esophagitis Refilled Prilosec 40 mg to pharamcy Continue dietary modifications  Medication management Magnesium  Screening for AAA - U/S ABD Retroperitoneal LTD  Screening for Thyroid  - TSH    Future Appointments   Date Time Provider Department Center  12/15/2024 10:00 AM Cruze Zingaro E, NP [facility] None    Discussed med's effects and SE's. Screening labs and tests as requested with regular follow-up as recommended.  HPI  68 y.o. female  presents for a complete physical. has Hyperlipidemia; Vitamin D  deficiency; Allergy; Depression; Medication management; Climacteric; Abnormal glucose; Agatston coronary artery calcium  score less than 100; and Gastroesophageal reflux disease without esophagitis on their problem list.    Her blood pressure has been controlled at home, today their BP is BP: 132/78.   BP Readings from Last 3 Encounters:  12/16/23 132/78  10/21/23 122/80  06/03/23 128/78  She does workout, since the pandemic she is not attending classes but she is walking/riding bikes. She denies chest pain, shortness of breath, dizziness.    BMI is Body mass index is 26.05 kg/m., she is working on diet and exercise. Continues diet and exercise, she is doing yoga and the gym 2-4 times/week  Wt Readings from Last 3 Encounters:  12/16/23 133 lb 6.4 oz (60.5 kg)  10/21/23 129 lb 3.2 oz (58.6 kg)  06/03/23 132 lb 9.6 oz (60.1 kg)   She works with [employer] and works now from office 2 days a week.  She is taking Meloxicam  every other day for her back pain and is helping very well.   More stress,  daughter-in-law has thyroid  cancer and is completely in remission. Mom is doing better, she is going to semi retire in 04/2023  to 2 days a week.  She continues on lexapro  20 mg  and Xanax  0.5 mg PRN. Xanax  0.5 mg # 30 0 refills last filled on 09/03/23. Zolpidem 12/09/23 #30 filled . She is doing ok with sleep, some better than others.  Mammogram normal 12/18/22- has scheduled 01/2024  She is on cholesterol medication, rosuvastatin  5 mg , taking every other day and does get some knee/feet/hands. . Her cholesterol is not at goal. The cholesterol last visit was:  Lab Results  Component Value Date   CHOL 209 (H)  06/03/2023   HDL 66 06/03/2023   LDLCALC 117 (H) 06/03/2023   TRIG 145 06/03/2023   CHOLHDL 3.2 06/03/2023  . She has been working on diet and exercise for prediabetes, she is on bASA, she is not on ACE/ARB and denies foot ulcerations, hyperglycemia, hypoglycemia , increased appetite, nausea, paresthesia of the feet, polydipsia, polyuria, visual disturbances, vomiting and weight loss. Last A1C in the office was:  Lab Results  Component Value Date   HGBA1C 6.0 (H) 12/11/2022   Patient is not currently on Vitamin D  supplement- takes one a day womens Lab Results  Component Value Date   VD25OH 33 12/11/2022      Current Medications:  Current Outpatient Medications on File Prior to Visit  Medication Sig Dispense Refill   Acetaminophen  (TYLENOL  PO) Take by mouth.     ALPRAZolam  (XANAX ) 0.5 MG tablet Take 1 tablet (0.5 mg total) by mouth 2 (two) times daily. AS NEEDED RARELY TAKES PER PT 45 tablet 0   cetirizine (ZYRTEC) 10 MG chewable tablet Chew 10 mg by mouth daily.     diphenhydrAMINE HCl (BENADRYL PO) Take by mouth.     escitalopram  (LEXAPRO ) 10 MG tablet TAKE 1 TABLET BY MOUTH EVERY DAY 90 tablet 1   Magnesium 400 MG TABS Take 400 mg by mouth daily. CURRENLTY 30 MG DAILY     Multiple Vitamins-Minerals (ONE A DAY WOMEN 50 PLUS) CHEW as directed Orally     omeprazole  (PRILOSEC) 40 MG capsule TAKE 1 CAPSULE BY MOUTH EVERY DAY TO PREVENT HEARTBURN & INDIGESTION 90 capsule 3   Probiotic Product (PROBIOTIC DAILY PO) Take by mouth daily.     rosuvastatin  (CRESTOR ) 5 MG tablet TAKE 1 TABLET (5 MG TOTAL) BY MOUTH DAILY. 30 tablet 11   zolpidem (AMBIEN) 10 MG tablet Take 10 mg by mouth at bedtime as needed for sleep.     No current facility-administered medications on file prior to visit.    Health Maintenance:   Immunization History  Administered Date(s) Administered   Janssen (J&J) SARS-COV-2 Vaccination 02/17/2020   Tdap 02/04/2008, 08/03/2018   Zoster, Live 12/28/2012   Health  Maintenance  Topic Date Due   Medicare Annual Wellness (AWV)  Never done   Hepatitis C Screening  Never done   Zoster Vaccines- Shingrix (1 of 2) 07/09/1976   MAMMOGRAM  10/21/2017   Colonoscopy  02/05/2018   COVID-19 Vaccine (2 - Janssen risk series) 03/16/2020   Pneumonia Vaccine 68+ Years old (1 of 1 - PCV) Never done   DEXA SCAN  07/09/2022   INFLUENZA VACCINE  Never done   DTaP/Tdap/Td (3 - Td or Tdap) 08/03/2028   HPV VACCINES  Aged Rembrandt Exam: 08/2023 Dentist 10 /2024  Patient Care Team: Tonita Fallow, MD as PCP - General (Internal Medicine) Glendia Simmonds, OD as Referring Physician (Optometry) Luis Purchase, MD as Consulting Physician (Gastroenterology) Rosalynn LELON Ingle, MD (Inactive) as Consulting Physician (Obstetrics and  Gynecology) Dozier Agent, MD as Referring Physician (Orthopedic Surgery) Livingston Rigg, MD (Inactive) as Consulting Physician (Dermatology)  Medical History:  Past Medical History:  Diagnosis Date   Allergy    Anxiety    Atypical nevus 04/13/2002   Left Chest-Slight to Moderate edges free   BCC (basal cell carcinoma of skin) 10/07/2011   Right Inner Shin   Depression    CONTROLLED WITH OCCASIONAL XANAX    Fibrocystic breast disease    GERD (gastroesophageal reflux disease)    Hyperlipidemia    Osteopenia    Prediabetes 09/05/2014   Vitamin D  deficiency    Allergies Allergies  Allergen Reactions   Acyclovir And Related    Egg-Derived Products Hives    Patient states she eats eggs with no problem.  Received propofol  with no reaction.    Monascus Purpureus Went Yeast Other (See Comments)    MYALGIA with higher doses   Neosporin [Neomycin-Bacitracin Zn-Polymyx] Hives   Penicillins Other (See Comments)   Red Yeast Rice [Cholestin] Other (See Comments)    MYALGIA with higher doses   Sulfa Antibiotics Hives   Zostavax [Zoster Vaccine Live] Rash    SURGICAL HISTORY She  has a past surgical history that includes Dilation and curettage  of uterus (2006); Novasure ablation (2006); Cervix lesion destruction; Skin cancer excision (Right, 10/2011); Carpal tunnel release (Right, 12/06/2015); Hernia repair; Bladder suspension (N/A, 09/10/2020); and Cystoscopy (N/A, 09/10/2020). FAMILY HISTORY Her family history includes Cancer in her paternal uncle; Diabetes in her paternal grandfather; Heart disease in her father and paternal grandfather; Hyperlipidemia in her father and mother; Hypertension in her father and mother; Rheum arthritis in her mother. SOCIAL HISTORY She  reports that she quit smoking about 33 years ago. Her smoking use included cigarettes. She has never used smokeless tobacco. She reports current alcohol use. She reports that she does not use drugs.'  Review of Systems: Review of Systems  Constitutional:  Negative for chills, fever and malaise/fatigue.  HENT:  Negative for congestion, ear pain and sore throat.   Respiratory:  Negative for cough, shortness of breath and wheezing.   Cardiovascular:  Negative for chest pain, palpitations and leg swelling.  Gastrointestinal:  Negative for abdominal pain, blood in stool, constipation, diarrhea, heartburn, melena, nausea and vomiting.  Genitourinary: Negative.  Negative for dysuria.  Musculoskeletal:  Negative for back pain and joint pain.  Skin:        Right breast lesion and tenderness  Neurological:  Negative for dizziness, sensory change, loss of consciousness and headaches.  Psychiatric/Behavioral:  Negative for depression. The patient is nervous/anxious and has insomnia.     Physical Exam: Estimated body mass index is 26.05 kg/m as calculated from the following:   Height as of this encounter: 5' (1.524 m).   Weight as of this encounter: 133 lb 6.4 oz (60.5 kg). BP 132/78   Pulse 68   Temp 97.7 F (36.5 C)   Ht 5' (1.524 m)   Wt 133 lb 6.4 oz (60.5 kg)   SpO2 98%   BMI 26.05 kg/m   General Appearance: Well nourished well developed, in no apparent distress.   Eyes: PERRLA, EOMs, conjunctiva no swelling or erythema ENT/Mouth: Ear canals normal without obstruction, swelling, erythema, or discharge.  TM's normal bilaterally with no erythema, bulging, retraction, or loss of landmark.  Oropharynx moist and clear with no exudate, erythema, or swelling.   Neck: Supple, thyroid  normal. No bruits.  No cervical adenopathy Respiratory: Respiratory effort normal, Breath sounds clear A&P without  wheeze, rhonchi, rales.   Cardio: RRR without murmurs, rubs or gallops. Brisk peripheral pulses without edema.  Chest: symmetric, with normal excursions Breasts: defer to gyn Abdomen: Soft, nontender, no guarding, rebound, hernias, masses, or organomegaly.  Lymphatics: Non tender without lymphadenopathy.  Musculoskeletal: Full ROM all peripheral extremities,5/5 strength, and normal gait.  Skin: Warm, dry without rashes, lesions, ecchymosis. Neuro: Awake and oriented X 3, Cranial nerves intact, reflexes equal bilaterally. Normal muscle tone, no cerebellar symptoms. Sensation intact.  Psych:  normal affect, Insight and Judgment appropriate.  Pelvic: defer to GYN  EKG: NSR, no St Changes AAA: < 3 cm   Norma Tornow FORBES [place], NP 10:28 AM [facility]

## 2023-12-16 ENCOUNTER — Encounter: Payer: Self-pay | Admitting: Nurse Practitioner

## 2023-12-16 ENCOUNTER — Ambulatory Visit (INDEPENDENT_AMBULATORY_CARE_PROVIDER_SITE_OTHER): Payer: Medicare Other | Admitting: Nurse Practitioner

## 2023-12-16 VITALS — BP 132/78 | HR 68 | Temp 97.7°F | Ht 60.0 in | Wt 133.4 lb

## 2023-12-16 DIAGNOSIS — I10 Essential (primary) hypertension: Secondary | ICD-10-CM

## 2023-12-16 DIAGNOSIS — F419 Anxiety disorder, unspecified: Secondary | ICD-10-CM

## 2023-12-16 DIAGNOSIS — Z1329 Encounter for screening for other suspected endocrine disorder: Secondary | ICD-10-CM

## 2023-12-16 DIAGNOSIS — E559 Vitamin D deficiency, unspecified: Secondary | ICD-10-CM

## 2023-12-16 DIAGNOSIS — Z0000 Encounter for general adult medical examination without abnormal findings: Secondary | ICD-10-CM

## 2023-12-16 DIAGNOSIS — F3341 Major depressive disorder, recurrent, in partial remission: Secondary | ICD-10-CM

## 2023-12-16 DIAGNOSIS — M545 Low back pain, unspecified: Secondary | ICD-10-CM

## 2023-12-16 DIAGNOSIS — Z0001 Encounter for general adult medical examination with abnormal findings: Secondary | ICD-10-CM

## 2023-12-16 DIAGNOSIS — R7309 Other abnormal glucose: Secondary | ICD-10-CM | POA: Diagnosis not present

## 2023-12-16 DIAGNOSIS — Z1389 Encounter for screening for other disorder: Secondary | ICD-10-CM

## 2023-12-16 DIAGNOSIS — Z79899 Other long term (current) drug therapy: Secondary | ICD-10-CM

## 2023-12-16 DIAGNOSIS — E782 Mixed hyperlipidemia: Secondary | ICD-10-CM

## 2023-12-16 DIAGNOSIS — K219 Gastro-esophageal reflux disease without esophagitis: Secondary | ICD-10-CM | POA: Diagnosis not present

## 2023-12-16 DIAGNOSIS — N951 Menopausal and female climacteric states: Secondary | ICD-10-CM

## 2023-12-16 DIAGNOSIS — E663 Overweight: Secondary | ICD-10-CM

## 2023-12-16 DIAGNOSIS — M25551 Pain in right hip: Secondary | ICD-10-CM

## 2023-12-16 DIAGNOSIS — I700 Atherosclerosis of aorta: Secondary | ICD-10-CM | POA: Diagnosis not present

## 2023-12-16 DIAGNOSIS — Z136 Encounter for screening for cardiovascular disorders: Secondary | ICD-10-CM | POA: Diagnosis not present

## 2023-12-16 MED ORDER — MELOXICAM 15 MG PO TABS: ORAL_TABLET | ORAL | 2 refills | Status: DC

## 2023-12-16 NOTE — Patient Instructions (Signed)

## 2023-12-17 ENCOUNTER — Other Ambulatory Visit: Payer: Self-pay | Admitting: Nurse Practitioner

## 2023-12-17 DIAGNOSIS — E782 Mixed hyperlipidemia: Secondary | ICD-10-CM

## 2023-12-17 LAB — CBC WITH DIFFERENTIAL/PLATELET
Absolute Lymphocytes: 2897 {cells}/uL (ref 850–3900)
Absolute Monocytes: 518 {cells}/uL (ref 200–950)
Basophils Absolute: 57 {cells}/uL (ref 0–200)
Basophils Relative: 0.8 %
Eosinophils Absolute: 149 {cells}/uL (ref 15–500)
Eosinophils Relative: 2.1 %
HCT: 41.7 % (ref 35.0–45.0)
Hemoglobin: 13.5 g/dL (ref 11.7–15.5)
MCH: 31.9 pg (ref 27.0–33.0)
MCHC: 32.4 g/dL (ref 32.0–36.0)
MCV: 98.6 fL (ref 80.0–100.0)
MPV: 11.1 fL (ref 7.5–12.5)
Monocytes Relative: 7.3 %
Neutro Abs: 3479 {cells}/uL (ref 1500–7800)
Neutrophils Relative %: 49 %
Platelets: 323 10*3/uL (ref 140–400)
RBC: 4.23 10*6/uL (ref 3.80–5.10)
RDW: 12.8 % (ref 11.0–15.0)
Total Lymphocyte: 40.8 %
WBC: 7.1 10*3/uL (ref 3.8–10.8)

## 2023-12-17 LAB — URINALYSIS, ROUTINE W REFLEX MICROSCOPIC
Bacteria, UA: NONE SEEN /[HPF]
Bilirubin Urine: NEGATIVE
Glucose, UA: NEGATIVE
Hgb urine dipstick: NEGATIVE
Hyaline Cast: NONE SEEN /[LPF]
Ketones, ur: NEGATIVE
Nitrite: NEGATIVE
Protein, ur: NEGATIVE
RBC / HPF: NONE SEEN /[HPF] (ref 0–2)
Specific Gravity, Urine: 1.012 (ref 1.001–1.035)
Squamous Epithelial / HPF: NONE SEEN /[HPF] (ref <=5)
WBC, UA: NONE SEEN /[HPF] (ref 0–5)
pH: 7.5 (ref 5.0–8.0)

## 2023-12-17 LAB — COMPLETE METABOLIC PANEL WITH GFR
AG Ratio: 1.8 (calc) (ref 1.0–2.5)
ALT: 14 U/L (ref 6–29)
AST: 16 U/L (ref 10–35)
Albumin: 4.6 g/dL (ref 3.6–5.1)
Alkaline phosphatase (APISO): 53 U/L (ref 37–153)
BUN: 20 mg/dL (ref 7–25)
CO2: 28 mmol/L (ref 20–32)
Calcium: 9.7 mg/dL (ref 8.6–10.4)
Chloride: 101 mmol/L (ref 98–110)
Creat: 0.95 mg/dL (ref 0.50–1.05)
Globulin: 2.5 g/dL (ref 1.9–3.7)
Glucose, Bld: 86 mg/dL (ref 65–99)
Potassium: 4.4 mmol/L (ref 3.5–5.3)
Sodium: 139 mmol/L (ref 135–146)
Total Bilirubin: 0.4 mg/dL (ref 0.2–1.2)
Total Protein: 7.1 g/dL (ref 6.1–8.1)
eGFR: 66 mL/min/{1.73_m2} (ref >=60)

## 2023-12-17 LAB — LIPID PANEL
Cholesterol: 257 mg/dL — ABNORMAL HIGH (ref <200)
HDL: 74 mg/dL (ref >=50)
LDL Cholesterol (Calc): 151 mg/dL — ABNORMAL HIGH
Non-HDL Cholesterol (Calc): 183 mg/dL — ABNORMAL HIGH (ref <130)
Total CHOL/HDL Ratio: 3.5 (calc) (ref <5.0)
Triglycerides: 185 mg/dL — ABNORMAL HIGH (ref <150)

## 2023-12-17 LAB — MAGNESIUM: Magnesium: 2.1 mg/dL (ref 1.5–2.5)

## 2023-12-17 LAB — HEMOGLOBIN A1C W/OUT EAG: Hgb A1c MFr Bld: 5.9 %{Hb} — ABNORMAL HIGH (ref <5.7)

## 2023-12-17 LAB — TSH: TSH: 1.05 m[IU]/L (ref 0.40–4.50)

## 2023-12-17 LAB — MICROSCOPIC MESSAGE

## 2023-12-17 LAB — MICROALBUMIN / CREATININE URINE RATIO
Creatinine, Urine: 42 mg/dL (ref 20–275)
Microalb, Ur: <0.2 mg/dL

## 2023-12-17 LAB — VITAMIN D 25 HYDROXY (VIT D DEFICIENCY, FRACTURES): Vit D, 25-Hydroxy: 30 ng/mL (ref 30–100)

## 2023-12-17 MED ORDER — EZETIMIBE 10 MG PO TABS: 10.0000 mg | ORAL_TABLET | Freq: Every day | ORAL | 11 refills | Status: AC

## 2024-01-08 ENCOUNTER — Ambulatory Visit (INDEPENDENT_AMBULATORY_CARE_PROVIDER_SITE_OTHER): Payer: Medicare Other | Admitting: Nurse Practitioner

## 2024-01-08 VITALS — BP 136/70 | HR 70 | Temp 97.9°F | Resp 16 | Ht 60.0 in | Wt 138.0 lb

## 2024-01-08 DIAGNOSIS — J0100 Acute maxillary sinusitis, unspecified: Secondary | ICD-10-CM | POA: Diagnosis not present

## 2024-01-08 DIAGNOSIS — R0981 Nasal congestion: Secondary | ICD-10-CM

## 2024-01-08 DIAGNOSIS — R051 Acute cough: Secondary | ICD-10-CM | POA: Diagnosis not present

## 2024-01-08 MED ORDER — AZITHROMYCIN 250 MG PO TABS: ORAL_TABLET | ORAL | 1 refills | Status: DC

## 2024-01-08 NOTE — Progress Notes (Unsigned)
Assessment and Plan:  Norma Cameron was seen today for an episodic visit.  Diagnoses and all order for this visit:  1. Maxillary Sinusitis  Start tmt with abx - take in full and as directed to reduce abx resistance. Stay well hydrated to keep mucus thin and productive   - azithromycin (ZITHROMAX) 250 MG tablet; Take 2 tablets on  Day 1,  followed by 1 tablet  daily for 4 more days    for Sinusitis  /Bronchitis  Dispense: 6 each; Refill: 1  2. Acute cough Stay well hydrated to keep any mucus thin and productive. Coughing can be cuased by several factors including   breathing in things that bother (irritate) your lungs.  Allergies.  Asthma.  Mucus that runs down the back of your throat (postnasal drip).  Smoking/smoke.  Acid backing up from the stomach into the tube that moves food from the mouth to the stomach (gastroesophageal reflux). A cough can linger for 3 weeks. Watch for any changes in your cough and contact office if noticed including blood, pus, pain, night sweats. Cover your mouth when you cough. If the air is dry, use a cool mist vaporizer or humidifier in your home. If your cough is worse at night, try using extra pillows to raise your head up higher while you sleep. Call 911 or report to ER if you start to have difficulty breathing.      3. Nasal congestion Continue Zyrtec and/or Benadryl   Notify office for further evaluation and treatment, questions or concerns if s/s fail to improve. The risks and benefits of my recommendations, as well as other treatment options were discussed with the patient today. Questions were answered.  Further disposition pending results of labs. Discussed med's effects and SE's.    Over 20 minutes of exam, counseling, chart review, and critical decision making was performed.   Future Appointments  Date Time Provider Department Center  06/14/2024 11:30 AM Lucky Cowboy, MD [facility] None  12/15/2024 10:00 AM Raynelle Dick, NP  [facility] None    ------------------------------------------------------------------------------------------------------------------   HPI BP 136/70   Pulse 70   Temp 97.9 F (36.6 C)   Resp 16   Ht 5' (1.524 m)   Wt 138 lb (62.6 kg)   SpO2 98%   BMI 26.95 kg/m    Patient complains of symptoms of a URI, possible sinusitis. Symptoms include congestion, cough described as waxing and waning over time, nasal congestion, sinus pressure, and sneezing. Onset of symptoms was 5 days ago, and has been unchanged since that time. Treatment to date: antihistamines and decongestants. Denies fever, chills.     Past Medical History:  Diagnosis Date   Allergy    Anxiety    Atypical nevus 04/13/2002   Left Chest-Slight to Moderate edges free   BCC (basal cell carcinoma of skin) 10/07/2011   Right Inner Shin   Depression    CONTROLLED WITH OCCASIONAL XANAX   Fibrocystic breast disease    GERD (gastroesophageal reflux disease)    Hyperlipidemia    Osteopenia    Prediabetes 09/05/2014   Vitamin D deficiency      Allergies  Allergen Reactions   Acyclovir And Related    Egg-Derived Products Hives    Patient states she eats eggs with no problem.  Received propofol with no reaction.    Monascus Purpureus Went Yeast Other (See Comments)    MYALGIA with higher doses   Neosporin [Neomycin-Bacitracin Zn-Polymyx] Hives   Penicillins Other (See Comments)  Red Yeast Rice [Cholestin] Other (See Comments)    MYALGIA with higher doses   Sulfa Antibiotics Hives   Zostavax [Zoster Vaccine Live] Rash    Current Outpatient Medications on File Prior to Visit  Medication Sig   Acetaminophen (TYLENOL PO) Take by mouth.   ALPRAZolam (XANAX) 0.5 MG tablet Take 1 tablet (0.5 mg total) by mouth 2 (two) times daily. AS NEEDED RARELY TAKES PER PT   cetirizine (ZYRTEC) 10 MG chewable tablet Chew 10 mg by mouth daily.   diphenhydrAMINE HCl (BENADRYL PO) Take by mouth.   escitalopram (LEXAPRO) 10 MG  tablet TAKE 1 TABLET BY MOUTH EVERY DAY   ezetimibe (ZETIA) 10 MG tablet Take 1 tablet (10 mg total) by mouth daily.   Magnesium 400 MG TABS Take 400 mg by mouth daily. CURRENLTY 30 MG DAILY   meloxicam (MOBIC) 15 MG tablet Take 1 tab by mouth every other day for pain   Multiple Vitamins-Minerals (ONE A DAY WOMEN 50 PLUS) CHEW as directed Orally   omeprazole (PRILOSEC) 40 MG capsule TAKE 1 CAPSULE BY MOUTH EVERY DAY TO PREVENT HEARTBURN & INDIGESTION   Probiotic Product (PROBIOTIC DAILY PO) Take by mouth daily.   zolpidem (AMBIEN) 10 MG tablet Take 10 mg by mouth at bedtime as needed for sleep.   rosuvastatin (CRESTOR) 5 MG tablet TAKE 1 TABLET (5 MG TOTAL) BY MOUTH DAILY.   No current facility-administered medications on file prior to visit.    ROS: all negative except what is noted in the HPI.   Physical Exam:  BP 136/70   Pulse 70   Temp 97.9 F (36.6 C)   Resp 16   Ht 5' (1.524 m)   Wt 138 lb (62.6 kg)   SpO2 98%   BMI 26.95 kg/m   General Appearance: NAD.  Awake, conversant and cooperative. Eyes: PERRLA, EOMs intact.  Sclera white.  Conjunctiva without erythema. Sinuses: No Frontal/maxillary tenderness.  No nasal discharge. Nares patent.  ENT/Mouth: Ext aud canals clear.  Bilateral TMs w/DOL and without erythema or bulging. Hearing intact.  Posterior pharynx without swelling or exudate.  Tonsils without swelling or erythema.  Neck: Supple.  No masses, nodules or thyromegaly. Respiratory: Effort is regular with non-labored breathing. Breath sounds are equal bilaterally without rales, rhonchi, wheezing or stridor.  Cardio: RRR with no MRGs. Brisk peripheral pulses without edema.  Abdomen: Active BS in all four quadrants.  Soft and non-tender without guarding, rebound tenderness, hernias or masses. Lymphatics: Non tender without lymphadenopathy.  Musculoskeletal: Full ROM, 5/5 strength, normal ambulation.  No clubbing or cyanosis. Skin: Appropriate color for ethnicity. Warm  without rashes, lesions, ecchymosis, ulcers.  Neuro: CN II-XII grossly normal. Normal muscle tone without cerebellar symptoms and intact sensation.   Psych: AO X 3,  appropriate mood and affect, insight and judgment.     Adela Glimpse, NP 12:15 PM [facility]

## 2024-01-10 ENCOUNTER — Encounter: Payer: Self-pay | Admitting: Nurse Practitioner

## 2024-01-10 NOTE — Patient Instructions (Signed)

## 2024-01-13 ENCOUNTER — Other Ambulatory Visit: Payer: Self-pay | Admitting: Nurse Practitioner

## 2024-01-13 DIAGNOSIS — E782 Mixed hyperlipidemia: Secondary | ICD-10-CM

## 2024-01-19 ENCOUNTER — Encounter: Payer: Self-pay | Admitting: *Deleted

## 2024-01-20 DIAGNOSIS — M8588 Other specified disorders of bone density and structure, other site: Secondary | ICD-10-CM | POA: Diagnosis not present

## 2024-05-11 ENCOUNTER — Ambulatory Visit (INDEPENDENT_AMBULATORY_CARE_PROVIDER_SITE_OTHER): Payer: BC Managed Care – PPO | Admitting: Family Medicine

## 2024-05-11 ENCOUNTER — Encounter: Payer: Self-pay | Admitting: Family Medicine

## 2024-05-11 ENCOUNTER — Ambulatory Visit: Payer: Self-pay | Admitting: Family Medicine

## 2024-05-11 VITALS — BP 157/80 | HR 65 | Temp 97.9°F | Ht 61.0 in | Wt 134.8 lb

## 2024-05-11 DIAGNOSIS — E559 Vitamin D deficiency, unspecified: Secondary | ICD-10-CM

## 2024-05-11 DIAGNOSIS — K219 Gastro-esophageal reflux disease without esophagitis: Secondary | ICD-10-CM

## 2024-05-11 DIAGNOSIS — F411 Generalized anxiety disorder: Secondary | ICD-10-CM | POA: Diagnosis not present

## 2024-05-11 DIAGNOSIS — T691XXA Chilblains, initial encounter: Secondary | ICD-10-CM | POA: Insufficient documentation

## 2024-05-11 DIAGNOSIS — F5104 Psychophysiologic insomnia: Secondary | ICD-10-CM

## 2024-05-11 DIAGNOSIS — E782 Mixed hyperlipidemia: Secondary | ICD-10-CM

## 2024-05-11 DIAGNOSIS — R7303 Prediabetes: Secondary | ICD-10-CM

## 2024-05-11 DIAGNOSIS — E538 Deficiency of other specified B group vitamins: Secondary | ICD-10-CM | POA: Insufficient documentation

## 2024-05-11 DIAGNOSIS — F339 Major depressive disorder, recurrent, unspecified: Secondary | ICD-10-CM

## 2024-05-11 DIAGNOSIS — M1991 Primary osteoarthritis, unspecified site: Secondary | ICD-10-CM | POA: Insufficient documentation

## 2024-05-11 LAB — BAYER DCA HB A1C WAIVED: HB A1C (BAYER DCA - WAIVED): 5.5 % (ref 4.8–5.6)

## 2024-05-11 MED ORDER — ESCITALOPRAM OXALATE 20 MG PO TABS: 20.0000 mg | ORAL_TABLET | Freq: Every day | ORAL | 1 refills | Status: DC

## 2024-05-11 NOTE — Progress Notes (Signed)
 Subjective:  Patient ID: Norma Cameron, female    DOB: [birth year], 68 y.o.   MRN: [id number]  Patient Care Team: Galvin Jules, FNP as PCP - General (Family Medicine) Nelva Bang, OD as Referring Physician (Optometry) Serafin Dames, MD as Consulting Physician (Gastroenterology) Leontine Rana, MD (Inactive) as Consulting Physician (Obstetrics and Gynecology) Luigi Sailor, MD as Referring Physician (Orthopedic Surgery) Devon Fogo, MD (Inactive) as Consulting Physician (Dermatology)   Chief Complaint:  New Patient (Initial Visit) ([facility]/), Establish Care, and Depression (Mom is in hospice )   HPI: Norma Cameron is a 68 y.o. female presenting on 05/11/2024 for New Patient (Initial Visit) ([facility]/), Establish Care, and Depression (Mom is in hospice )   Norma Cameron is a 68 year old female who presents to establish care.  She has a history of prediabetes diagnosed in January 2024. She has not made significant dietary changes since the diagnosis and has not had her A1c checked since January. She experiences cravings for sugar and has difficulty controlling these cravings. No increased hunger, thirst, or urination.  She has osteopenia and experiences significant pain in her hips and back, which she attributes to osteoarthritis. She underwent x-rays last fall, and her rheumatologist has prescribed meloxicam , which has provided some relief. She has not engaged in physical therapy for her condition.  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 She tries to limit screen time and reads to help wind down. She is currently on escitalopram  10 mg, which she feels may need an increase as her sleep issues persist.  Her family history includes her mother having rheumatoid arthritis and her father having a massive heart attack at age 32. She has been evaluated by a  rheumatologist due to her mother's history and her own indicative markers, but she does not have rheumatoid arthritis.  She takes vitamin D , B12, and magnesium supplements. She has no history of thyroid , kidney, or liver problems and denies any history of cancer or heart disease in herself. Her coronary artery calcium  score was less than 100, with a score of 3, and she continues to take her cholesterol medication.           05/11/2024   10:06 AM  Depression screen PHQ 2/9  Decreased Interest 1  Down, Depressed, Hopeless 2  PHQ - 2 Score 3  Altered sleeping 2  Tired, decreased energy 2  Change in appetite 2  Feeling bad or failure about yourself  0  Trouble concentrating 0  Moving slowly or fidgety/restless 0  Suicidal thoughts 0  PHQ-9 Score 9  Difficult doing work/chores Not difficult at all      05/11/2024   10:06 AM  GAD 7 : Generalized Anxiety Score  Nervous, Anxious, on Edge 1  Control/stop worrying 1  Worry too much - different things 1  Trouble relaxing 1  Restless 1  Easily annoyed or irritable 0  Afraid - awful might happen 0  Total GAD 7 Score 5  Anxiety Difficulty Not difficult at all      Relevant past medical, surgical, family, and social history reviewed and updated as indicated.  Allergies and medications reviewed and updated. Data reviewed: Chart in Epic.   Past Medical History:  Diagnosis Date   Allergy    Anxiety    Atypical nevus 04/13/2002   Left Chest-Slight to Moderate edges free   BCC (basal cell carcinoma of skin) 10/07/2011  Right Inner Shin   Depression    CONTROLLED WITH OCCASIONAL XANAX    Fibrocystic breast disease    GERD (gastroesophageal reflux disease)    Hyperlipidemia    Osteopenia    Prediabetes 09/05/2014   Prediabetes 07/29/2018   Vitamin D  deficiency     Past Surgical History:  Procedure Laterality Date   BLADDER SUSPENSION N/A 09/10/2020   Procedure: TRANSVAGINAL TAPE (TVT) PROCEDURE;  Surgeon: Thora Flint, MD;   Location: [facility];  Service: Gynecology;  Laterality: N/A;   CARPAL TUNNEL RELEASE Right 12/06/2015   Procedure: RIGHT CARPAL TUNNEL RELEASE;  Surgeon: Brunilda Capra, MD;  Location: [facility];  Service: Orthopedics;  Laterality: Right;   CERVIX LESION DESTRUCTION     EARLY 20'S   CYSTOSCOPY N/A 09/10/2020   Procedure: CYSTOSCOPY;  Surgeon: Thora Flint, MD;  Location: [facility];  Service: Gynecology;  Laterality: N/A;   DILATION AND CURETTAGE OF UTERUS  2006   HERNIA REPAIR     AGE 9   NOVASURE ABLATION  2006   SKIN CANCER EXCISION Right 10/2011   LOWER EXT BCC    Social History   Socioeconomic History   Marital status: Married    Spouse name: Not on file   Number of children: Not on file   Years of education: 14   Highest education level: Associate degree: academic program  Occupational History   Not on file  Tobacco Use   Smoking status: Former    Current packs/day: 0.00    Types: Cigarettes    Quit date: 03/13/1990    Years since quitting: 34.1   Smokeless tobacco: Never  Vaping Use   Vaping status: Never Used  Substance and Sexual Activity   Alcohol use: Yes    Comment: occ   Drug use: No   Sexual activity: Not Currently  Other Topics Concern   Not on file  Social History Narrative   Not on file   Social Drivers of Health   Financial Resource Strain: Low Risk  (05/05/2024)   Overall Financial Resource Strain (CARDIA)    Difficulty of Paying Living Expenses: Not hard at all  Food Insecurity: No Food Insecurity (05/05/2024)   Hunger Vital Sign    Worried About Running Out of Food in the Last Year: Never true    Ran Out of Food in the Last Year: Never true  Transportation Needs: No Transportation Needs (05/05/2024)   PRAPARE - Administrator, Civil Service (Medical): No    Lack of Transportation (Non-Medical): No  Physical Activity: Unknown (05/05/2024)   Exercise Vital Sign    Days of Exercise per  Week: 0 days    Minutes of Exercise per Session: Not on file  Stress: Stress Concern Present (05/05/2024)   [employer] of Occupational Health - Occupational Stress Questionnaire    Feeling of Stress : Very much  Social Connections: Socially Integrated (05/05/2024)   Social Connection and Isolation Panel [NHANES]    Frequency of Communication with Friends and Family: More than three times a week    Frequency of Social Gatherings with Friends and Family: Once a week    Attends Religious Services: More than 4 times per year    Active Member of [employer] or Organizations: Yes    Attends Banker Meetings: More than 4 times per year    Marital Status: Married  Intimate Partner Violence: Unknown (03/14/2022)   Received from [facility]   HITS  Physically Hurt: Not on file    Insult or Talk Down To: Not on file    Threaten Physical Harm: Not on file    Scream or Curse: Not on file    Outpatient Encounter Medications as of 05/11/2024  Medication Sig   Acetaminophen  (TYLENOL  PO) Take by mouth.   ALPRAZolam  (XANAX ) 0.5 MG tablet Take 1 tablet (0.5 mg total) by mouth 2 (two) times daily. AS NEEDED RARELY TAKES PER PT   cetirizine (ZYRTEC) 10 MG chewable tablet Chew 10 mg by mouth daily.   diphenhydrAMINE HCl (BENADRYL PO) Take by mouth.   escitalopram  (LEXAPRO ) 20 MG tablet Take 1 tablet (20 mg total) by mouth daily.   ezetimibe  (ZETIA ) 10 MG tablet Take 1 tablet (10 mg total) by mouth daily.   Magnesium 400 MG TABS Take 400 mg by mouth daily. CURRENLTY 30 MG DAILY   meloxicam  (MOBIC ) 15 MG tablet Take 1 tab by mouth every other day for pain   Multiple Vitamins-Minerals (ONE A DAY WOMEN 50 PLUS) CHEW as directed Orally   omeprazole  (PRILOSEC) 40 MG capsule TAKE 1 CAPSULE BY MOUTH EVERY DAY TO PREVENT HEARTBURN & INDIGESTION   Probiotic Product (PROBIOTIC DAILY PO) Take by mouth daily.   rosuvastatin  (CRESTOR ) 5 MG tablet TAKE 1 TABLET (5 MG TOTAL) BY MOUTH DAILY.    zolpidem (AMBIEN) 10 MG tablet Take 10 mg by mouth at bedtime as needed for sleep.   [DISCONTINUED] escitalopram  (LEXAPRO ) 10 MG tablet TAKE 1 TABLET BY MOUTH EVERY DAY   [DISCONTINUED] azithromycin  (ZITHROMAX ) 250 MG tablet Take 2 tablets on  Day 1,  followed by 1 tablet  daily for 4 more days    for Sinusitis  /Bronchitis   No facility-administered encounter medications on file as of 05/11/2024.    Allergies  Allergen Reactions   Acyclovir And Related    Egg-Derived Products Hives    Patient states she eats eggs with no problem.  Received propofol  with no reaction.    Monascus Purpureus Went Yeast Other (See Comments)    MYALGIA with higher doses   Neosporin [Neomycin-Bacitracin Zn-Polymyx] Hives   Penicillins Other (See Comments)   Red Yeast Rice [Cholestin] Other (See Comments)    MYALGIA with higher doses   Sulfa Antibiotics Hives and Other (See Comments)   Zostavax [Zoster Vaccine Live] Rash    Pertinent ROS per HPI, otherwise unremarkable      Objective:  BP (!) 157/80   Pulse 65   Temp 97.9 F (36.6 C)   Ht 5\' 1"  (1.549 m)   Wt 134 lb 12.8 oz (61.1 kg)   SpO2 97%   BMI 25.47 kg/m    Wt Readings from Last 3 Encounters:  05/11/24 134 lb 12.8 oz (61.1 kg)  01/08/24 138 lb (62.6 kg)  12/16/23 133 lb 6.4 oz (60.5 kg)    Physical Exam Vitals and nursing note reviewed.  Constitutional:      Appearance: Normal appearance. She is normal weight.  HENT:     Head: Normocephalic and atraumatic.     Mouth/Throat:     Mouth: Mucous membranes are moist.  Eyes:     Pupils: Pupils are equal, round, and reactive to light.  Cardiovascular:     Rate and Rhythm: Normal rate and regular rhythm.     Heart sounds: Normal heart sounds.  Pulmonary:     Effort: Pulmonary effort is normal.     Breath sounds: Normal breath sounds.  Musculoskeletal:     Cervical  back: Neck supple.  Skin:    General: Skin is warm and dry.     Capillary Refill: Capillary refill takes less than  2 seconds.  Neurological:     General: No focal deficit present.     Mental Status: She is alert and oriented to person, place, and time.  Psychiatric:        Mood and Affect: Mood normal.        Behavior: Behavior normal.        Thought Content: Thought content normal.        Judgment: Judgment normal.      Results for orders placed or performed in visit on 12/16/23  MICROSCOPIC MESSAGE   Collection Time: 12/16/23 10:28 AM  Result Value Ref Range   Note    CBC with Differential/Platelet   Collection Time: 12/16/23 10:28 AM  Result Value Ref Range   WBC 7.1 3.8 - 10.8 Thousand/uL   RBC 4.23 3.80 - 5.10 Million/uL   Hemoglobin 13.5 11.7 - 15.5 g/dL   HCT 95.6 21.3 - 08.6 %   MCV 98.6 80.0 - 100.0 fL   MCH 31.9 27.0 - 33.0 pg   MCHC 32.4 32.0 - 36.0 g/dL   RDW 57.8 46.9 - 62.9 %   Platelets 323 140 - 400 Thousand/uL   MPV 11.1 7.5 - 12.5 fL   Neutro Abs 3,479 1,500 - 7,800 cells/uL   Absolute Lymphocytes 2,897 850 - 3,900 cells/uL   Absolute Monocytes 518 200 - 950 cells/uL   Eosinophils Absolute 149 15 - 500 cells/uL   Basophils Absolute 57 0 - 200 cells/uL   Neutrophils Relative % 49 %   Total Lymphocyte 40.8 %   Monocytes Relative 7.3 %   Eosinophils Relative 2.1 %   Basophils Relative 0.8 %  COMPLETE METABOLIC PANEL WITH GFR   Collection Time: 12/16/23 10:28 AM  Result Value Ref Range   Glucose, Bld 86 65 - 99 mg/dL   BUN 20 7 - 25 mg/dL   Creat 5.28 4.13 - 2.44 mg/dL   eGFR 66 > OR = 60 WN/UUV/2.53G6   BUN/Creatinine Ratio SEE NOTE: 6 - 22 (calc)   Sodium 139 135 - 146 mmol/L   Potassium 4.4 3.5 - 5.3 mmol/L   Chloride 101 98 - 110 mmol/L   CO2 28 20 - 32 mmol/L   Calcium  9.7 8.6 - 10.4 mg/dL   Total Protein 7.1 6.1 - 8.1 g/dL   Albumin 4.6 3.6 - 5.1 g/dL   Globulin 2.5 1.9 - 3.7 g/dL (calc)   AG Ratio 1.8 1.0 - 2.5 (calc)   Total Bilirubin 0.4 0.2 - 1.2 mg/dL   Alkaline phosphatase (APISO) 53 37 - 153 U/L   AST 16 10 - 35 U/L   ALT 14 6 - 29 U/L   Magnesium   Collection Time: 12/16/23 10:28 AM  Result Value Ref Range   Magnesium 2.1 1.5 - 2.5 mg/dL  Lipid panel   Collection Time: 12/16/23 10:28 AM  Result Value Ref Range   Cholesterol 257 (H) <200 mg/dL   HDL 74 > OR = 50 mg/dL   Triglycerides 440 (H) <150 mg/dL   LDL Cholesterol (Calc) 151 (H) mg/dL (calc)   Total CHOL/HDL Ratio 3.5 <5.0 (calc)   Non-HDL Cholesterol (Calc) 183 (H) <130 mg/dL (calc)  TSH   Collection Time: 12/16/23 10:28 AM  Result Value Ref Range   TSH 1.05 0.40 - 4.50 mIU/L  Hemoglobin A1C w/out eAG   Collection Time: 12/16/23  10:28 AM  Result Value Ref Range   Hgb A1c MFr Bld 5.9 (H) <5.7 % of total Hgb  VITAMIN D  25 Hydroxy (Vit-D Deficiency, Fractures)   Collection Time: 12/16/23 10:28 AM  Result Value Ref Range   Vit D, 25-Hydroxy 30 30 - 100 ng/mL  Urinalysis, Routine w reflex microscopic   Collection Time: 12/16/23 10:28 AM  Result Value Ref Range   Color, Urine YELLOW YELLOW   APPearance CLEAR CLEAR   Specific Gravity, Urine 1.012 1.001 - 1.035   pH 7.5 5.0 - 8.0   Glucose, UA NEGATIVE NEGATIVE   Bilirubin Urine NEGATIVE NEGATIVE   Ketones, ur NEGATIVE NEGATIVE   Hgb urine dipstick NEGATIVE NEGATIVE   Protein, ur NEGATIVE NEGATIVE   Nitrite NEGATIVE NEGATIVE   Leukocytes,Ua 1+ (A) NEGATIVE   WBC, UA NONE SEEN 0 - 5 /HPF   RBC / HPF NONE SEEN 0 - 2 /HPF   Squamous Epithelial / HPF NONE SEEN < OR = 5 /HPF   Bacteria, UA NONE SEEN NONE SEEN /HPF   Hyaline Cast NONE SEEN NONE SEEN /LPF  Microalbumin / creatinine urine ratio   Collection Time: 12/16/23 10:28 AM  Result Value Ref Range   Creatinine, Urine 42 20 - 275 mg/dL   Microalb, Ur <4.0 mg/dL   Microalb Creat Ratio NOTE <30 mg/g creat       Pertinent labs & imaging results that were available during my care of the patient were reviewed by me and considered in my medical decision making.  Assessment & Plan:  Norma Cameron was seen today for new patient (initial visit), establish care  and depression.  Diagnoses and all orders for this visit:  Depression, recurrent (HCC) -     escitalopram  (LEXAPRO ) 20 MG tablet; Take 1 tablet (20 mg total) by mouth daily. -     CMP14+EGFR -     CBC with Differential/Platelet -     Thyroid  Panel With TSH -     VITAMIN D  25 Hydroxy (Vit-D Deficiency, Fractures) -     Vitamin B12  Generalized anxiety disorder -     escitalopram  (LEXAPRO ) 20 MG tablet; Take 1 tablet (20 mg total) by mouth daily. -     CMP14+EGFR -     CBC with Differential/Platelet -     Thyroid  Panel With TSH -     VITAMIN D  25 Hydroxy (Vit-D Deficiency, Fractures) -     Vitamin B12  Vitamin D  deficiency -     CMP14+EGFR -     VITAMIN D  25 Hydroxy (Vit-D Deficiency, Fractures)  Gastroesophageal reflux disease without esophagitis -     CBC with Differential/Platelet  Mixed hyperlipidemia -     CMP14+EGFR -     Lipid panel  Prediabetes -     CMP14+EGFR -     CBC with Differential/Platelet -     Lipid panel -     Thyroid  Panel With TSH -     Bayer DCA Hb A1c Waived  Vitamin B12 deficiency -     CBC with Differential/Platelet -     Vitamin B12  Psychophysiological insomnia -     Thyroid  Panel With TSH      Insomnia Chronic difficulty with sleep initiation and maintenance since childhood, potentially linked to underlying anxiety and depression. Current management includes limiting screen time and reading, providing some relief. - Increase escitalopram  to 20 mg daily to address potential underlying anxiety contributing to insomnia - Reassess sleep quality in 4-6 weeks  Prediabetes Diagnosed since January 2024. No significant dietary changes reported. Experiences sugar cravings and binge eating, potentially linked to anxiety and depression. No increased hunger, thirst, or urination reported. Increasing escitalopram  may help with sugar cravings and binge eating. - Check A1c level today - Reassess in 4-6 weeks after lab results  Osteoarthritis Chronic  pain in hips and back, likely due to osteoarthritis. Reports pain despite meloxicam  use. No physical therapy initiated. Staying active and using heat therapy are recommended. - Recommend physical therapy to improve joint function and reduce pain - Advise use of heat therapy and Tylenol  Arthritis in addition to meloxicam   Hyperlipidemia Cholesterol issues managed with medication. Recent dietary intake may affect triglyceride levels, but LDL and total cholesterol are primary concerns. Coronary artery calcium  score is low, indicating low risk for coronary artery disease. - Check lipid panel today  Osteopenia Osteopenia with recent bone density scan in January or February. Referred to rheumatologist for management.  General Health Maintenance Takes vitamin D , B12, and magnesium supplements. - Check vitamin D , B12, and magnesium levels today - Check liver and kidney function, thyroid  function, and electrolytes today  Follow-up Reassessment of conditions and review of lab results. - Schedule follow-up appointment in 4-6 weeks - Review lab results and contact with findings           Continue all other maintenance medications.  Follow up plan: Return in about 4 weeks (around 06/08/2024), or if symptoms worsen or fail to improve, for Depression, Anxiety.   Continue healthy lifestyle choices, including diet (rich in fruits, vegetables, and lean proteins, and low in salt and simple carbohydrates) and exercise (at least 30 minutes of moderate physical activity daily).  Educational handout given for health maintenance   The above assessment and management plan was discussed with the patient. The patient verbalized understanding of and has agreed to the management plan. Patient is aware to call the clinic if they develop any new symptoms or if symptoms persist or worsen. Patient is aware when to return to the clinic for a follow-up visit. Patient educated on when it is appropriate to go to the  emergency department.   Kattie Parrot, FNP-C [facility] [phone]

## 2024-05-12 LAB — CBC WITH DIFFERENTIAL/PLATELET
Basophils Absolute: 0.1 10*3/uL (ref 0.0–0.2)
Basos: 1 %
EOS (ABSOLUTE): 0.1 10*3/uL (ref 0.0–0.4)
Eos: 2 %
Hematocrit: 41 % (ref 34.0–46.6)
Hemoglobin: 13.3 g/dL (ref 11.1–15.9)
Immature Grans (Abs): 0 10*3/uL (ref 0.0–0.1)
Immature Granulocytes: 0 %
Lymphocytes Absolute: 2.9 10*3/uL (ref 0.7–3.1)
Lymphs: 46 %
MCH: 32.7 pg (ref 26.6–33.0)
MCHC: 32.4 g/dL (ref 31.5–35.7)
MCV: 101 fL — ABNORMAL HIGH (ref 79–97)
Monocytes Absolute: 0.5 10*3/uL (ref 0.1–0.9)
Monocytes: 7 %
Neutrophils Absolute: 2.8 10*3/uL (ref 1.4–7.0)
Neutrophils: 44 %
Platelets: 246 10*3/uL (ref 150–450)
RBC: 4.07 x10E6/uL (ref 3.77–5.28)
RDW: 13 % (ref 11.7–15.4)
WBC: 6.4 10*3/uL (ref 3.4–10.8)

## 2024-05-12 LAB — CMP14+EGFR
ALT: 14 IU/L (ref 0–32)
AST: 19 IU/L (ref 0–40)
Albumin: 4.6 g/dL (ref 3.9–4.9)
Alkaline Phosphatase: 55 IU/L (ref 44–121)
BUN/Creatinine Ratio: 20 (ref 12–28)
BUN: 19 mg/dL (ref 8–27)
Bilirubin Total: 0.4 mg/dL (ref 0.0–1.2)
CO2: 22 mmol/L (ref 20–29)
Calcium: 9.9 mg/dL (ref 8.7–10.3)
Chloride: 101 mmol/L (ref 96–106)
Creatinine, Ser: 0.96 mg/dL (ref 0.57–1.00)
Globulin, Total: 2.1 g/dL (ref 1.5–4.5)
Glucose: 83 mg/dL (ref 70–99)
Potassium: 5.1 mmol/L (ref 3.5–5.2)
Sodium: 141 mmol/L (ref 134–144)
Total Protein: 6.7 g/dL (ref 6.0–8.5)
eGFR: 65 mL/min/{1.73_m2} (ref >59)

## 2024-05-12 LAB — THYROID PANEL WITH TSH
Free Thyroxine Index: 1.5 (ref 1.2–4.9)
T3 Uptake Ratio: 23 % — ABNORMAL LOW (ref 24–39)
T4, Total: 6.5 ug/dL (ref 4.5–12.0)
TSH: 0.756 u[IU]/mL (ref 0.450–4.500)

## 2024-05-12 LAB — LIPID PANEL
Chol/HDL Ratio: 2.6 ratio (ref 0.0–4.4)
Cholesterol, Total: 162 mg/dL (ref 100–199)
HDL: 62 mg/dL (ref >39)
LDL Chol Calc (NIH): 78 mg/dL (ref 0–99)
Triglycerides: 124 mg/dL (ref 0–149)
VLDL Cholesterol Cal: 22 mg/dL (ref 5–40)

## 2024-05-12 LAB — VITAMIN B12: Vitamin B-12: 1107 pg/mL (ref 232–1245)

## 2024-05-12 LAB — VITAMIN D 25 HYDROXY (VIT D DEFICIENCY, FRACTURES): Vit D, 25-Hydroxy: 55.6 ng/mL (ref 30.0–100.0)

## 2024-05-15 ENCOUNTER — Encounter: Payer: Self-pay | Admitting: Family Medicine

## 2024-06-14 ENCOUNTER — Ambulatory Visit: Payer: Medicare Other | Admitting: Internal Medicine

## 2024-06-15 ENCOUNTER — Ambulatory Visit: Admitting: Family Medicine

## 2024-06-15 ENCOUNTER — Encounter: Payer: Self-pay | Admitting: Family Medicine

## 2024-06-15 VITALS — BP 137/66 | HR 62 | Temp 97.5°F | Ht 61.0 in | Wt 136.2 lb

## 2024-06-15 DIAGNOSIS — M25551 Pain in right hip: Secondary | ICD-10-CM

## 2024-06-15 DIAGNOSIS — M5442 Lumbago with sciatica, left side: Secondary | ICD-10-CM | POA: Diagnosis not present

## 2024-06-15 DIAGNOSIS — Z79899 Other long term (current) drug therapy: Secondary | ICD-10-CM

## 2024-06-15 DIAGNOSIS — F411 Generalized anxiety disorder: Secondary | ICD-10-CM | POA: Diagnosis not present

## 2024-06-15 DIAGNOSIS — F339 Major depressive disorder, recurrent, unspecified: Secondary | ICD-10-CM | POA: Diagnosis not present

## 2024-06-15 DIAGNOSIS — M25552 Pain in left hip: Secondary | ICD-10-CM

## 2024-06-15 DIAGNOSIS — M545 Low back pain, unspecified: Secondary | ICD-10-CM

## 2024-06-15 MED ORDER — MELOXICAM 15 MG PO TABS: ORAL_TABLET | ORAL | 2 refills | Status: AC

## 2024-06-15 MED ORDER — NAPROXEN 500 MG PO TABS: 500.0000 mg | ORAL_TABLET | Freq: Two times a day (BID) | ORAL | 0 refills | Status: AC

## 2024-06-15 NOTE — Patient Instructions (Addendum)
 PDGF+     If your symptoms worsen or you have thoughts of suicide/homicide, PLEASE SEEK IMMEDIATE MEDICAL ATTENTION.  You may always call the National Suicide Hotline.  This is available 24 hours a day, 7 days a week.  Their number is: 432-803-5203  Taking the medicine as directed and not missing any doses is one of the best things you can do to treat your depression.  Here are some things to keep in mind:  Side effects (stomach upset, some increased anxiety) may happen before you notice a benefit.  These side effects typically go away over time. Changes to your dose of medicine or a change in medication all together is sometimes necessary Most people need to be on medication at least 12 months Many people will notice an improvement within two weeks but the full effect of the medication can take up to 4-6 weeks Stopping the medication when you start feeling better often results in a return of symptoms Never discontinue your medication without contacting a health care professional first.  Some medications require gradual discontinuation/ taper and can make you sick if you stop them abruptly.  If your symptoms worsen or you have thoughts of suicide/homicide, PLEASE SEEK IMMEDIATE MEDICAL ATTENTION.  You may always call:  National Suicide Hotline: 614-308-6219 [place] Crisis Line: 716-255-9562 Crisis Recovery in [place]: (301)236-1482  These are available 24 hours a day, 7 days a week.

## 2024-06-15 NOTE — Progress Notes (Signed)
 Subjective:  Patient ID: Norma Cameron, female    DOB: Jun 15, 1956, 68 y.o.   MRN: [id number]  Patient Care Team: Severa Rock HERO, FNP as PCP - General (Family Medicine) Glendia Simmonds, OD as Referring Physician (Optometry) Luis Purchase, MD as Consulting Physician (Gastroenterology) Rosalynn LELON Ingle, MD (Inactive) as Consulting Physician (Obstetrics and Gynecology) Dozier Agent, MD as Referring Physician (Orthopedic Surgery) Livingston Rigg, MD (Inactive) as Consulting Physician (Dermatology)   Chief Complaint:  Depression and Anxiety (4 week follow up - patient states she is doing good with the increase of lexapro  )   HPI: Norma Cameron is a 68 y.o. female presenting on 06/15/2024 for Depression and Anxiety (4 week follow up - patient states she is doing good with the increase of lexapro  )   The patient presents with back pain and is here for follow-up on Lexapro  dosage increase.  Lumbosacral radicular pain - Back pain present for the past year - Pain radiates down the anterior aspect of the leg - Pain worsens with prolonged standing - Yoga stretches provide some relief - No recent injuries - No loss of bowel or bladder control  Antidepressant therapy - Currently taking Lexapro , recently increased to 20 mg - No side effects from the increased dosage - Lexapro  increase has been beneficial  Recent bereavement - Mother passed away after five weeks in hospice care for terminal illness - Involved in planning the funeral according to mother's wishes         06/15/2024   11:09 AM 05/11/2024   10:06 AM  GAD 7 : Generalized Anxiety Score  Nervous, Anxious, on Edge 0 1  Control/stop worrying 0 1  Worry too much - different things 0 1  Trouble relaxing 0 1  Restless 0 1  Easily annoyed or irritable 0 0  Afraid - awful might happen 0 0  Total GAD 7 Score 0 5  Anxiety Difficulty Not difficult at all Not difficult at all       06/15/2024   11:09 AM 05/11/2024   10:06 AM  Depression  screen PHQ 2/9  Decreased Interest 0 1  Down, Depressed, Hopeless 0 2  PHQ - 2 Score 0 3  Altered sleeping 2 2  Tired, decreased energy 0 2  Change in appetite 3 2  Feeling bad or failure about yourself  0 0  Trouble concentrating 0 0  Moving slowly or fidgety/restless 0 0  Suicidal thoughts 0 0  PHQ-9 Score 5 9  Difficult doing work/chores Not difficult at all Not difficult at all       Relevant past medical, surgical, family, and social history reviewed and updated as indicated.  Allergies and medications reviewed and updated. Data reviewed: Chart in Epic.   Past Medical History:  Diagnosis Date   Allergy    Anxiety    Atypical nevus 04/13/2002   Left Chest-Slight to Moderate edges free   BCC (basal cell carcinoma of skin) 10/07/2011   Right Inner Shin   Depression    CONTROLLED WITH OCCASIONAL XANAX    Fibrocystic breast disease    GERD (gastroesophageal reflux disease)    Hyperlipidemia    Osteopenia    Prediabetes 09/05/2014   Prediabetes 07/29/2018   Vitamin D  deficiency     Past Surgical History:  Procedure Laterality Date   BLADDER SUSPENSION N/A 09/10/2020   Procedure: TRANSVAGINAL TAPE (TVT) PROCEDURE;  Surgeon: Curlene Agent, MD;  Location: [facility] ;  Service: Gynecology;  Laterality: N/A;  CARPAL TUNNEL RELEASE Right 12/06/2015   Procedure: RIGHT CARPAL TUNNEL RELEASE;  Surgeon: Franky Curia, MD;  Location: [facility];  Service: Orthopedics;  Laterality: Right;   CERVIX LESION DESTRUCTION     EARLY 20'S   CYSTOSCOPY N/A 09/10/2020   Procedure: CYSTOSCOPY;  Surgeon: Curlene Agent, MD;  Location: [facility];  Service: Gynecology;  Laterality: N/A;   DILATION AND CURETTAGE OF UTERUS  2006   HERNIA REPAIR     AGE 52   NOVASURE ABLATION  2006   SKIN CANCER EXCISION Right 10/2011   LOWER EXT BCC    Social History   Socioeconomic History   Marital status: Married    Spouse name: Not on file   Number of  children: Not on file   Years of education: 14   Highest education level: Associate degree: academic program  Occupational History   Not on file  Tobacco Use   Smoking status: Former    Current packs/day: 0.00    Types: Cigarettes    Quit date: 03/13/1990    Years since quitting: 34.2   Smokeless tobacco: Never  Vaping Use   Vaping status: Never Used  Substance and Sexual Activity   Alcohol use: Yes    Comment: occ   Drug use: No   Sexual activity: Not Currently  Other Topics Concern   Not on file  Social History Narrative   Not on file   Social Drivers of Health   Financial Resource Strain: Low Risk  (05/05/2024)   Overall Financial Resource Strain (CARDIA)    Difficulty of Paying Living Expenses: Not hard at all  Food Insecurity: No Food Insecurity (05/05/2024)   Hunger Vital Sign    Worried About Running Out of Food in the Last Year: Never true    Ran Out of Food in the Last Year: Never true  Transportation Needs: No Transportation Needs (05/05/2024)   PRAPARE - Administrator, Civil Service (Medical): No    Lack of Transportation (Non-Medical): No  Physical Activity: Unknown (05/05/2024)   Exercise Vital Sign    Days of Exercise per Week: 0 days    Minutes of Exercise per Session: Not on file  Stress: Stress Concern Present (05/05/2024)   [employer] of Occupational Health - Occupational Stress Questionnaire    Feeling of Stress : Very much  Social Connections: Socially Integrated (05/05/2024)   Social Connection and Isolation Panel    Frequency of Communication with Friends and Family: More than three times a week    Frequency of Social Gatherings with Friends and Family: Once a week    Attends Religious Services: More than 4 times per year    Active Member of [employer] or Organizations: Yes    Attends Banker Meetings: More than 4 times per year    Marital Status: Married  Catering manager Violence: Unknown (03/14/2022)   Received from [facility]   HITS    Physically Hurt: Not on file    Insult or Talk Down To: Not on file    Threaten Physical Harm: Not on file    Scream or Curse: Not on file    Outpatient Encounter Medications as of 06/15/2024  Medication Sig   Acetaminophen  (TYLENOL  PO) Take by mouth.   ALPRAZolam  (XANAX ) 0.5 MG tablet Take 1 tablet (0.5 mg total) by mouth 2 (two) times daily. AS NEEDED RARELY TAKES PER PT   cetirizine (ZYRTEC) 10 MG chewable tablet Chew 10 mg  by mouth daily.   diphenhydrAMINE HCl (BENADRYL PO) Take by mouth.   escitalopram  (LEXAPRO ) 20 MG tablet Take 1 tablet (20 mg total) by mouth daily.   ezetimibe  (ZETIA ) 10 MG tablet Take 1 tablet (10 mg total) by mouth daily.   Magnesium 400 MG TABS Take 400 mg by mouth daily. CURRENLTY 30 MG DAILY   Multiple Vitamins-Minerals (ONE A DAY WOMEN 50 PLUS) CHEW as directed Orally   naproxen  (NAPROSYN ) 500 MG tablet Take 1 tablet (500 mg total) by mouth 2 (two) times daily with a meal for 14 days.   omeprazole  (PRILOSEC) 40 MG capsule TAKE 1 CAPSULE BY MOUTH EVERY DAY TO PREVENT HEARTBURN & INDIGESTION   Probiotic Product (PROBIOTIC DAILY PO) Take by mouth daily.   rosuvastatin  (CRESTOR ) 5 MG tablet TAKE 1 TABLET (5 MG TOTAL) BY MOUTH DAILY.   zolpidem (AMBIEN) 10 MG tablet Take 10 mg by mouth at bedtime as needed for sleep.   [DISCONTINUED] meloxicam  (MOBIC ) 15 MG tablet Take 1 tab by mouth every other day for pain   meloxicam  (MOBIC ) 15 MG tablet Take 1 tab by mouth every other day for pain   No facility-administered encounter medications on file as of 06/15/2024.    Allergies  Allergen Reactions   Acyclovir And Related    Egg-Derived Products Hives    Patient states she eats eggs with no problem.  Received propofol  with no reaction.    Monascus Purpureus Went Yeast Other (See Comments)    MYALGIA with higher doses   Neosporin [Neomycin-Bacitracin Zn-Polymyx] Hives   Penicillins Other (See Comments)    Product containing penicillin (product)    Red Yeast Rice [Cholestin] Other (See Comments)    MYALGIA with higher doses   Sulfa Antibiotics Hives and Other (See Comments)   Zostavax [Zoster Vaccine Live] Rash    Pertinent ROS per HPI, otherwise unremarkable      Objective:  BP 137/66   Pulse 62   Temp (!) 97.5 F (36.4 C)   Ht 5' 1 (1.549 m)   Wt 136 lb 3.2 oz (61.8 kg)   SpO2 96%   BMI 25.73 kg/m    Wt Readings from Last 3 Encounters:  06/15/24 136 lb 3.2 oz (61.8 kg)  05/11/24 134 lb 12.8 oz (61.1 kg)  01/08/24 138 lb (62.6 kg)    Physical Exam Vitals and nursing note reviewed.  Constitutional:      General: She is not in acute distress.    Appearance: Normal appearance. She is normal weight. She is not ill-appearing, toxic-appearing or diaphoretic.  HENT:     Head: Normocephalic and atraumatic.     Mouth/Throat:     Mouth: Mucous membranes are moist.     Pharynx: Oropharynx is clear.  Eyes:     Conjunctiva/sclera: Conjunctivae normal.     Pupils: Pupils are equal, round, and reactive to light.  Cardiovascular:     Rate and Rhythm: Normal rate and regular rhythm.     Heart sounds: Normal heart sounds.  Pulmonary:     Breath sounds: Normal breath sounds.  Musculoskeletal:     Cervical back: Neck supple.     Thoracic back: Normal.     Lumbar back: Tenderness present. No swelling, edema, deformity, signs of trauma, lacerations, spasms or bony tenderness. Normal range of motion. Positive left straight leg raise test. Negative right straight leg raise test. No scoliosis.     Right hip: Normal.     Left hip: Normal.  Right lower leg: No edema.     Left lower leg: No edema.  Skin:    General: Skin is warm and dry.     Capillary Refill: Capillary refill takes less than 2 seconds.  Neurological:     General: No focal deficit present.     Mental Status: She is alert and oriented to person, place, and time.  Psychiatric:        Mood and Affect: Mood normal.        Behavior: Behavior normal.         Thought Content: Thought content normal.        Judgment: Judgment normal.     Results for orders placed or performed in visit on 05/11/24  Bayer DCA Hb A1c Waived   Collection Time: 05/11/24 10:20 AM  Result Value Ref Range   HB A1C (BAYER DCA - WAIVED) 5.5 4.8 - 5.6 %  CMP14+EGFR   Collection Time: 05/11/24 10:22 AM  Result Value Ref Range   Glucose 83 70 - 99 mg/dL   BUN 19 8 - 27 mg/dL   Creatinine, Ser 9.03 0.57 - 1.00 mg/dL   eGFR 65 >40 fO/fpw/8.26   BUN/Creatinine Ratio 20 12 - 28   Sodium 141 134 - 144 mmol/L   Potassium 5.1 3.5 - 5.2 mmol/L   Chloride 101 96 - 106 mmol/L   CO2 22 20 - 29 mmol/L   Calcium  9.9 8.7 - 10.3 mg/dL   Total Protein 6.7 6.0 - 8.5 g/dL   Albumin 4.6 3.9 - 4.9 g/dL   Globulin, Total 2.1 1.5 - 4.5 g/dL   Bilirubin Total 0.4 0.0 - 1.2 mg/dL   Alkaline Phosphatase 55 44 - 121 IU/L   AST 19 0 - 40 IU/L   ALT 14 0 - 32 IU/L  CBC with Differential/Platelet   Collection Time: 05/11/24 10:22 AM  Result Value Ref Range   WBC 6.4 3.4 - 10.8 x10E3/uL   RBC 4.07 3.77 - 5.28 x10E6/uL   Hemoglobin 13.3 11.1 - 15.9 g/dL   Hematocrit 58.9 65.9 - 46.6 %   MCV 101 (H) 79 - 97 fL   MCH 32.7 26.6 - 33.0 pg   MCHC 32.4 31.5 - 35.7 g/dL   RDW 86.9 88.2 - 84.5 %   Platelets 246 150 - 450 x10E3/uL   Neutrophils 44 Not Estab. %   Lymphs 46 Not Estab. %   Monocytes 7 Not Estab. %   Eos 2 Not Estab. %   Basos 1 Not Estab. %   Neutrophils Absolute 2.8 1.4 - 7.0 x10E3/uL   Lymphocytes Absolute 2.9 0.7 - 3.1 x10E3/uL   Monocytes Absolute 0.5 0.1 - 0.9 x10E3/uL   EOS (ABSOLUTE) 0.1 0.0 - 0.4 x10E3/uL   Basophils Absolute 0.1 0.0 - 0.2 x10E3/uL   Immature Granulocytes 0 Not Estab. %   Immature Grans (Abs) 0.0 0.0 - 0.1 x10E3/uL  Lipid panel   Collection Time: 05/11/24 10:22 AM  Result Value Ref Range   Cholesterol, Total 162 100 - 199 mg/dL   Triglycerides 875 0 - 149 mg/dL   HDL 62 >60 mg/dL   VLDL Cholesterol Cal 22 5 - 40 mg/dL   LDL Chol Calc (NIH) 78  0 - 99 mg/dL   Chol/HDL Ratio 2.6 0.0 - 4.4 ratio  Thyroid  Panel With TSH   Collection Time: 05/11/24 10:22 AM  Result Value Ref Range   TSH 0.756 0.450 - 4.500 uIU/mL   T4, Total 6.5 4.5 - 12.0 ug/dL  T3 Uptake Ratio 23 (L) 24 - 39 %   Free Thyroxine Index 1.5 1.2 - 4.9  VITAMIN D  25 Hydroxy (Vit-D Deficiency, Fractures)   Collection Time: 05/11/24 10:22 AM  Result Value Ref Range   Vit D, 25-Hydroxy 55.6 30.0 - 100.0 ng/mL  Vitamin B12   Collection Time: 05/11/24 10:22 AM  Result Value Ref Range   Vitamin B-12 1,107 232 - 1,245 pg/mL       Pertinent labs & imaging results that were available during my care of the patient were reviewed by me and considered in my medical decision making.  Assessment & Plan:  Norma Cameron was seen today for depression and anxiety.  Diagnoses and all orders for this visit:  Depression, recurrent (HCC) -     BMP8+EGFR  Generalized anxiety disorder -     BMP8+EGFR  High risk medication use -     BMP8+EGFR  Acute bilateral low back pain with left-sided sciatica -     naproxen  (NAPROSYN ) 500 MG tablet; Take 1 tablet (500 mg total) by mouth 2 (two) times daily with a meal for 14 days.  Lumbar pain -     meloxicam  (MOBIC ) 15 MG tablet; Take 1 tab by mouth every other day for pain  Bilateral hip pain -     meloxicam  (MOBIC ) 15 MG tablet; Take 1 tab by mouth every other day for pain      Sciatica Back pain radiating down the leg for 3-4 weeks, consistent with sciatica. No loss of bowel or bladder function. No recent injuries. Pain exacerbated by prolonged standing. Managing with yoga stretches, providing some relief. Steroids not preferred due to side effects such as feeling jittery and increased appetite. Naproxen  recommended as a non-steroidal anti-inflammatory alternative for anti-inflammatory effects. - Prescribe naproxen  twice daily for two weeks, to be taken with food. - Refill meloxicam  but instruct not to start until after completing the  naproxen  course.  Depression Currently on Lexapro , recently increased to 20 mg, with no reported side effects. The increase in dosage appears beneficial. Lexapro  can potentially lower sodium levels, especially at higher doses. - Continue Lexapro  at 20 mg daily. - Check sodium levels to monitor for potential hyponatremia associated with Lexapro .  Chronic Condition Management Follow-up for chronic condition management. - Schedule follow-up appointment in 3-4 months or sooner if needed.          Continue all other maintenance medications.  Follow up plan: Return in about 3 months (around 09/15/2024), or if symptoms worsen or fail to improve, for chronic follow up.   Continue healthy lifestyle choices, including diet (rich in fruits, vegetables, and lean proteins, and low in salt and simple carbohydrates) and exercise (at least 30 minutes of moderate physical activity daily).  Educational handout given for depression  The above assessment and management plan was discussed with the patient. The patient verbalized understanding of and has agreed to the management plan. Patient is aware to call the clinic if they develop any new symptoms or if symptoms persist or worsen. Patient is aware when to return to the clinic for a follow-up visit. Patient educated on when it is appropriate to go to the emergency department.   Norma Bruns, FNP-C [facility] [phone]

## 2024-06-16 ENCOUNTER — Ambulatory Visit: Payer: Self-pay | Admitting: Family Medicine

## 2024-06-16 LAB — BMP8+EGFR
BUN/Creatinine Ratio: 22 (ref 12–28)
BUN: 21 mg/dL (ref 8–27)
CO2: 25 mmol/L (ref 20–29)
Calcium: 10.3 mg/dL (ref 8.7–10.3)
Chloride: 101 mmol/L (ref 96–106)
Creatinine, Ser: 0.97 mg/dL (ref 0.57–1.00)
Glucose: 79 mg/dL (ref 70–99)
Potassium: 4.7 mmol/L (ref 3.5–5.2)
Sodium: 141 mmol/L (ref 134–144)
eGFR: 64 mL/min/1.73 (ref >59)

## 2024-08-16 ENCOUNTER — Telehealth: Payer: Self-pay

## 2024-08-16 NOTE — Telephone Encounter (Signed)
 Copied from CRM [id number]. Topic: Clinical - Medication Question >> Aug 16, 2024  8:13 AM Norma Cameron wrote: Reason for CRM: The patient would like to speak with a member of clinical staff regarding their prescriptions for ezetimibe  (ZETIA ) 10 MG tablet [[id number]] and rosuvastatin  (CRESTOR ) 5 MG tablet [[id number]] and potential adjustments of the prescriptions   Please contact the patient further when possible

## 2024-08-16 NOTE — Telephone Encounter (Signed)
 Pt returned call and I didn't get an answer at the office. Please return call.

## 2024-08-16 NOTE — Telephone Encounter (Signed)
 Patient states she has been ongoing muscle aches.  Thinks its related to her Zetia  and Crestor  medication. She has been taking otc arthritis medication and it is not helping.

## 2024-08-16 NOTE — Telephone Encounter (Signed)
Attempted to contact- NA  Need more information

## 2024-08-16 NOTE — Telephone Encounter (Signed)
 Patient aware and verbalizes understanding.

## 2024-08-31 ENCOUNTER — Ambulatory Visit (INDEPENDENT_AMBULATORY_CARE_PROVIDER_SITE_OTHER)

## 2024-08-31 VITALS — BP 137/66 | HR 62 | Ht 61.0 in | Wt 136.0 lb

## 2024-08-31 DIAGNOSIS — Z0000 Encounter for general adult medical examination without abnormal findings: Secondary | ICD-10-CM | POA: Diagnosis not present

## 2024-08-31 NOTE — Patient Instructions (Signed)
 Ms. Norma Cameron,  Thank you for taking the time for your Medicare Wellness Visit. I appreciate your continued commitment to your health goals. Please review the care plan we discussed, and feel free to reach out if I can assist you further.  Medicare recommends these wellness visits once per year to help you and your care team stay ahead of potential health issues. These visits are designed to focus on prevention, allowing your provider to concentrate on managing your acute and chronic conditions during your regular appointments.  Please note that Annual Wellness Visits do not include a physical exam. Some assessments may be limited, especially if the visit was conducted virtually. If needed, we may recommend a separate in-person follow-up with your provider.  Ongoing Care Seeing your primary care provider every 3 to 6 months helps us  monitor your health and provide consistent, personalized care.   Referrals If a referral was made during today's visit and you haven't received any updates within two weeks, please contact the referred provider directly to check on the status.  Recommended Screenings:  Health Maintenance  Topic Date Due   Medicare Annual Wellness Visit  Never done   Hepatitis C Screening  Never done   Zoster (Shingles) Vaccine (1 of 2) 07/09/1976   Pneumococcal Vaccine for age over 62 (1 of 1 - PCV) Never done   Breast Cancer Screening  10/21/2017   Colon Cancer Screening  02/05/2018   COVID-19 Vaccine (2 - Janssen risk series) 03/16/2020   DEXA scan (bone density measurement)  07/09/2022   Flu Shot  Never done   DTaP/Tdap/Td vaccine (3 - Td or Tdap) 08/03/2028   HPV Vaccine  Aged Out   Meningitis B Vaccine  Aged Out       08/31/2024   10:28 AM  Advanced Directives  Does Patient Have a Medical Advance Directive? No   Advance Care Planning is important because it: Ensures you receive medical care that aligns with your values, goals, and preferences. Provides guidance to  your family and loved ones, reducing the emotional burden of decision-making during critical moments.  Vision: Annual vision screenings are recommended for early detection of glaucoma, cataracts, and diabetic retinopathy. These exams can also reveal signs of chronic conditions such as diabetes and high blood pressure.  Dental: Annual dental screenings help detect early signs of oral cancer, gum disease, and other conditions linked to overall health, including heart disease and diabetes.  Please see the attached documents for additional preventive care recommendations.

## 2024-08-31 NOTE — Progress Notes (Signed)
 Subjective:   Norma Cameron is a 68 y.o. who presents for a Medicare Wellness preventive visit.  As a reminder, Annual Wellness Visits don't include a physical exam, and some assessments may be limited, especially if this visit is performed virtually. We may recommend an in-person follow-up visit with your provider if needed.  Visit Complete: Virtual I connected with  Norma Cameron on 08/31/24 by a audio enabled telemedicine application and verified that I am speaking with the correct person using two identifiers.  Patient Location: Home  Provider Location: Home Office  I discussed the limitations of evaluation and management by telemedicine. The patient expressed understanding and agreed to proceed.  Vital Signs: Because this visit was a virtual/telehealth visit, some criteria may be missing or patient reported. Any vitals not documented were not able to be obtained and vitals that have been documented are patient reported.  VideoDeclined- This patient declined Librarian, academic. Therefore the visit was completed with audio only.  Persons Participating in Visit: Patient.  AWV Questionnaire: No: Patient Medicare AWV questionnaire was not completed prior to this visit.  Cardiac Risk Factors include: advanced age (>59men, >67 women);dyslipidemia;smoking/ tobacco exposure     Objective:    Today's Vitals   08/31/24 1315  BP: 137/66  Pulse: 62  Weight: 136 lb (61.7 kg)  Height: 5' 1 (1.549 m)   Body mass index is 25.7 kg/m.     08/31/2024   10:28 AM 09/10/2020    6:21 AM 12/06/2015    8:07 AM 11/27/2015    9:56 AM  Advanced Directives  Does Patient Have a Medical Advance Directive? No Yes Yes  Yes   Type of Advance Directive    Living will   Does patient want to make changes to medical advance directive?  No - Patient declined    Copy of Healthcare Power of Attorney in Chart?   No - copy requested       Data saved with a previous flowsheet row  definition    Current Medications (verified) Outpatient Encounter Medications as of 08/31/2024  Medication Sig   Acetaminophen  (TYLENOL  PO) Take by mouth.   ALPRAZolam  (XANAX ) 0.5 MG tablet Take 1 tablet (0.5 mg total) by mouth 2 (two) times daily. AS NEEDED RARELY TAKES PER PT   cetirizine (ZYRTEC) 10 MG chewable tablet Chew 10 mg by mouth daily.   diphenhydrAMINE HCl (BENADRYL PO) Take by mouth.   escitalopram  (LEXAPRO ) 20 MG tablet Take 1 tablet (20 mg total) by mouth daily.   ezetimibe  (ZETIA ) 10 MG tablet Take 1 tablet (10 mg total) by mouth daily.   Magnesium 400 MG TABS Take 400 mg by mouth daily. CURRENLTY 30 MG DAILY   meloxicam  (MOBIC ) 15 MG tablet Take 1 tab by mouth every other day for pain   Multiple Vitamins-Minerals (ONE A DAY WOMEN 50 PLUS) CHEW as directed Orally   omeprazole  (PRILOSEC) 40 MG capsule TAKE 1 CAPSULE BY MOUTH EVERY DAY TO PREVENT HEARTBURN & INDIGESTION   Probiotic Product (PROBIOTIC DAILY PO) Take by mouth daily.   rosuvastatin  (CRESTOR ) 5 MG tablet TAKE 1 TABLET (5 MG TOTAL) BY MOUTH DAILY.   zolpidem (AMBIEN) 10 MG tablet Take 10 mg by mouth at bedtime as needed for sleep.   No facility-administered encounter medications on file as of 08/31/2024.    Allergies (verified) Acyclovir and related, Egg-derived products, Monascus purpureus went yeast, Neosporin [neomycin-bacitracin zn-polymyx], Penicillins, Red yeast rice [cholestin], Sulfa antibiotics, and Zostavax [zoster vaccine live]  History: Past Medical History:  Diagnosis Date   Allergy    Anxiety    Atypical nevus 04/13/2002   Left Chest-Slight to Moderate edges free   BCC (basal cell carcinoma of skin) 10/07/2011   Right Inner Shin   Depression    CONTROLLED WITH OCCASIONAL XANAX    Fibrocystic breast disease    GERD (gastroesophageal reflux disease)    Hyperlipidemia    Osteopenia    Prediabetes 09/05/2014   Prediabetes 07/29/2018   Vitamin D  deficiency    Past Surgical History:   Procedure Laterality Date   BLADDER SUSPENSION N/A 09/10/2020   Procedure: TRANSVAGINAL TAPE (TVT) PROCEDURE;  Surgeon: Curlene Agent, MD;  Location: [facility];  Service: Gynecology;  Laterality: N/A;   CARPAL TUNNEL RELEASE Right 12/06/2015   Procedure: RIGHT CARPAL TUNNEL RELEASE;  Surgeon: Franky Curia, MD;  Location: [facility];  Service: Orthopedics;  Laterality: Right;   CERVIX LESION DESTRUCTION     EARLY 20'S   CYSTOSCOPY N/A 09/10/2020   Procedure: CYSTOSCOPY;  Surgeon: Curlene Agent, MD;  [facility];  Service: Gynecology;  Laterality: N/A;   DILATION AND CURETTAGE OF UTERUS  2006   HERNIA REPAIR     AGE 39   NOVASURE ABLATION  2006   SKIN CANCER EXCISION Right 10/2011   LOWER EXT BCC   Family History  Problem Relation Age of Onset   Hypertension Mother    Hyperlipidemia Mother    Rheum arthritis Mother    Brain cancer Mother    Heart disease Father        CABG   Hyperlipidemia Father    Hypertension Father    Cancer Paternal Uncle        THROAT   Heart disease Paternal Grandfather    Diabetes Paternal Grandfather    Social History   Socioeconomic History   Marital status: Married    Spouse name: Not on file   Number of children: Not on file   Years of education: 14   Highest education level: Associate degree: academic program  Occupational History   Not on file  Tobacco Use   Smoking status: Former    Current packs/day: 0.00    Types: Cigarettes    Quit date: 03/13/1990    Years since quitting: 34.4   Smokeless tobacco: Never  Vaping Use   Vaping status: Never Used  Substance and Sexual Activity   Alcohol use: Yes    Comment: occ   Drug use: No   Sexual activity: Not Currently  Other Topics Concern   Not on file  Social History Narrative   Not on file   Social Drivers of Health   Financial Resource Strain: Low Risk  (08/31/2024)   Overall Financial Resource Strain (CARDIA)    Difficulty of  Paying Living Expenses: Not hard at all  Food Insecurity: No Food Insecurity (08/31/2024)   Hunger Vital Sign    Worried About Running Out of Food in the Last Year: Never true    Ran Out of Food in the Last Year: Never true  Transportation Needs: No Transportation Needs (08/31/2024)   PRAPARE - Administrator, Civil Service (Medical): No    Lack of Transportation (Non-Medical): No  Physical Activity: Sufficiently Active (08/31/2024)   Exercise Vital Sign    Days of Exercise per Week: 7 days    Minutes of Exercise per Session: 60 min  Stress: No Stress Concern Present (08/31/2024)   [employer]  of Occupational Health - Occupational Stress Questionnaire    Feeling of Stress: Not at all  Social Connections: Moderately Integrated (08/31/2024)   Social Connection and Isolation Panel    Frequency of Communication with Friends and Family: Three times a week    Frequency of Social Gatherings with Friends and Family: Once a week    Attends Religious Services: Never    Database administrator or Organizations: Yes    Attends Engineer, structural: More than 4 times per year    Marital Status: Married    Tobacco Counseling Counseling given: Yes    Clinical Intake:  Pre-visit preparation completed: Yes  Pain : No/denies pain     BMI - recorded: 25.7 Nutritional Status: BMI 25 -29 Overweight Nutritional Risks: None Diabetes: No  Lab Results  Component Value Date   HGBA1C 5.5 05/11/2024   HGBA1C 5.9 (H) 12/16/2023   HGBA1C 6.0 (H) 12/11/2022     How often do you need to have someone help you when you read instructions, pamphlets, or other written materials from your doctor or pharmacy?: 1 - Never  Interpreter Needed?: No  Information entered by :: alia t/cma   Activities of Daily Living     08/31/2024   10:27 AM  In your present state of health, do you have any difficulty performing the following activities:  Hearing? 0  Vision? 0  Difficulty  concentrating or making decisions? 0  Walking or climbing stairs? 0  Dressing or bathing? 0  Doing errands, shopping? 0  Preparing Food and eating ? N  Using the Toilet? N  In the past six months, have you accidently leaked urine? N  Do you have problems with loss of bowel control? N  Managing your Medications? N  Managing your Finances? N  Housekeeping or managing your Housekeeping? N    Patient Care Team: Severa Rock HERO, FNP as PCP - General (Family Medicine) Glendia Simmonds, OD as Referring Physician (Optometry) Luis Purchase, MD as Consulting Physician (Gastroenterology) Rosalynn LELON Ingle, MD (Inactive) as Consulting Physician (Obstetrics and Gynecology) Dozier Agent, MD as Referring Physician (Orthopedic Surgery) Livingston Rigg, MD as Consulting Physician (Dermatology)  I have updated your Care Teams any recent Medical Services you may have received from other providers in the past year.     Assessment:   This is a routine wellness examination for Norma Cameron.  Hearing/Vision screen Hearing Screening - Comments:: Pt denies hearing dif Vision Screening - Comments:: Pt wear glasses/contacts/pt goes to Dr. Glendia in [place] at [facility]/last ov 1 week ago   Goals Addressed             This Visit's Progress    Patient Stated       Pt would continue to stay active/be healthy       Depression Screen     08/31/2024   10:28 AM 06/15/2024   11:09 AM 05/11/2024   10:06 AM  PHQ 2/9 Scores  PHQ - 2 Score 0 0 3  PHQ- 9 Score  5 9    Fall Risk     08/31/2024   10:25 AM 06/15/2024   11:09 AM 05/11/2024   10:05 AM  Fall Risk   Falls in the past year? 0 0 0  Number falls in past yr: 0 0 0  Injury with Fall? 0 0 0  Risk for fall due to : No Fall Risks No Fall Risks No Fall Risks  Follow up Falls evaluation completed Falls evaluation completed  Falls evaluation completed    MEDICARE RISK AT HOME:  Medicare Risk at Home Any stairs in or around the home?: Yes If so, are  there any without handrails?: Yes Home free of loose throw rugs in walkways, pet beds, electrical cords, etc?: Yes Adequate lighting in your home to reduce risk of falls?: Yes Life alert?: No Use of a cane, walker or w/c?: No Grab bars in the bathroom?: Yes Shower chair or bench in shower?: No Elevated toilet seat or a handicapped toilet?: No  TIMED UP AND GO:  Was the test performed?  no  Cognitive Function: 6CIT completed        08/31/2024   10:28 AM  6CIT Screen  What Year? 0 points  What month? 0 points  What time? 0 points  Count back from 20 0 points  Months in reverse 0 points  Repeat phrase 0 points  Total Score 0 points    Immunizations Immunization History  Administered Date(s) Administered   Janssen (J&J) SARS-COV-2 Vaccination 02/17/2020   Tdap 02/04/2008, 08/03/2018   Zoster, Live 12/28/2012    Screening Tests Health Maintenance  Topic Date Due   Hepatitis C Screening  Never done   Zoster Vaccines- Shingrix (1 of 2) 07/09/1976   Pneumococcal Vaccine: 50+ Years (1 of 1 - PCV) Never done   Mammogram  10/21/2017   Colonoscopy  02/05/2018   COVID-19 Vaccine (2 - Janssen risk series) 03/16/2020   DEXA SCAN  07/09/2022   Influenza Vaccine  Never done   Medicare Annual Wellness (AWV)  08/31/2025   DTaP/Tdap/Td (3 - Td or Tdap) 08/03/2028   HPV VACCINES  Aged Out   Meningococcal B Vaccine  Aged Out    Health Maintenance Items Addressed: See Nurse Notes at the end of this note  Additional Screening:  Vision Screening: Recommended annual ophthalmology exams for early detection of glaucoma and other disorders of the eye. Is the patient up to date with their annual eye exam?  yes Who is the provider or what is the name of the office in which the patient attends annual eye exams? Dr. Glendia at [facility] in [place]  Dental Screening: Recommended annual dental exams for proper oral hygiene  Community Resource Referral / Chronic Care  Management: CRR required this visit?  No   CCM required this visit?  No   Plan:    I have personally reviewed and noted the following in the patient's chart:   Medical and social history Use of alcohol, tobacco or illicit drugs  Current medications and supplements including opioid prescriptions. Patient is not currently taking opioid prescriptions. Functional ability and status Nutritional status Physical activity Advanced directives List of other physicians Hospitalizations, surgeries, and ER visits in previous 12 months Vitals Screenings to include cognitive, depression, and falls Referrals and appointments  In addition, I have reviewed and discussed with patient certain preventive protocols, quality metrics, and best practice recommendations. A written personalized care plan for preventive services as well as general preventive health recommendations were provided to patient.   Norma Cameron, CMA   08/31/2024   After Visit Summary: (MyChart) Due to this being a telephonic visit, the after visit summary with patients personalized plan was offered to patient via MyChart   Notes: PCP Follow Up Recommendations: Pt is aware and due the following, however, pt would like to discuss w/pcp first before anything is schedule: Colonoscopy, Dexa scan, hep C, flu, mammogram, Pneumonia and shingles.

## 2024-09-20 ENCOUNTER — Ambulatory Visit: Admitting: Family Medicine

## 2024-09-20 LAB — HM MAMMOGRAPHY

## 2024-09-23 LAB — HM PAP SMEAR

## 2024-09-28 ENCOUNTER — Ambulatory Visit: Admitting: Family Medicine

## 2024-09-28 ENCOUNTER — Encounter: Payer: Self-pay | Admitting: Family Medicine

## 2024-09-28 VITALS — BP 146/80 | HR 61 | Temp 97.0°F | Ht 61.0 in | Wt 139.0 lb

## 2024-09-28 DIAGNOSIS — Z1212 Encounter for screening for malignant neoplasm of rectum: Secondary | ICD-10-CM

## 2024-09-28 DIAGNOSIS — R7303 Prediabetes: Secondary | ICD-10-CM | POA: Diagnosis not present

## 2024-09-28 DIAGNOSIS — E782 Mixed hyperlipidemia: Secondary | ICD-10-CM

## 2024-09-28 DIAGNOSIS — Z1211 Encounter for screening for malignant neoplasm of colon: Secondary | ICD-10-CM

## 2024-09-28 DIAGNOSIS — F411 Generalized anxiety disorder: Secondary | ICD-10-CM

## 2024-09-28 DIAGNOSIS — F321 Major depressive disorder, single episode, moderate: Secondary | ICD-10-CM

## 2024-09-28 DIAGNOSIS — M25642 Stiffness of left hand, not elsewhere classified: Secondary | ICD-10-CM

## 2024-09-28 DIAGNOSIS — M25641 Stiffness of right hand, not elsewhere classified: Secondary | ICD-10-CM

## 2024-09-28 DIAGNOSIS — M545 Low back pain, unspecified: Secondary | ICD-10-CM

## 2024-09-28 DIAGNOSIS — F339 Major depressive disorder, recurrent, unspecified: Secondary | ICD-10-CM

## 2024-09-28 DIAGNOSIS — M79605 Pain in left leg: Secondary | ICD-10-CM

## 2024-09-28 LAB — BAYER DCA HB A1C WAIVED: HB A1C (BAYER DCA - WAIVED): 5.4 % (ref 4.8–5.6)

## 2024-09-28 MED ORDER — ESCITALOPRAM OXALATE 20 MG PO TABS: 20.0000 mg | ORAL_TABLET | Freq: Every day | ORAL | 3 refills | Status: AC

## 2024-09-28 MED ORDER — PREDNISONE 20 MG PO TABS: 40.0000 mg | ORAL_TABLET | Freq: Every day | ORAL | 0 refills | Status: AC

## 2024-09-28 NOTE — Progress Notes (Signed)
 Subjective:  Patient ID: Norma Cameron, female    DOB: [birth year], 68 y.o.   MRN: [id number]  Patient Care Team: Severa Rock HERO, FNP as PCP - General (Family Medicine) Glendia Simmonds, OD as Referring Physician (Optometry) Luis Purchase, MD as Consulting Physician (Gastroenterology) Rosalynn LELON Ingle, MD (Inactive) as Consulting Physician (Obstetrics and Gynecology) Dozier Agent, MD as Referring Physician (Orthopedic Surgery) Livingston Rigg, MD as Consulting Physician (Dermatology)   Chief Complaint:  Medical Management of Chronic Issues   HPI: Norma Cameron is a 68 y.o. female presenting on 09/28/2024 for Medical Management of Chronic Issues   Norma Cameron is a 68 year old female with lumbar degenerative changes and scoliosis who presents for chronic follow up and complains with worsening back pain and sciatica.  She has been experiencing significant back pain that has persisted over time. Initially, she sought treatment from a chiropractor, which provided some relief, but the pain has since returned. The pain is described as numb and burning, radiating down her left leg. She has been alternating between Motrin and ibuprofen for pain management and recently resumed chiropractic sessions, which have provided some relief. No bowel or bladder dysfunction, falls, or saddle anesthesia are reported. She had lumbar imaging in November of the previous year which revealed degenerative changes and scoliosis.   She reports the recent development of 'arthritic knots' on her hands, which have become more prominent over the past week. Her hands experience some stiffness, but she maintains mobility through activities like knitting and crocheting. She has a family history of rheumatoid arthritis, as her mother had the condition, but she herself has been diagnosed with osteoarthritis by a rheumatologist.  She is currently taking Lexapro  and is managing her depression concerns.   Despite the back pain, she  remains active, walking regularly, especially with her new puppy.           09/28/2024   11:04 AM 08/31/2024   10:28 AM 06/15/2024   11:09 AM 05/11/2024   10:06 AM  Depression screen PHQ 2/9  Decreased Interest 0 0 0 1  Down, Depressed, Hopeless 0 0 0 2  PHQ - 2 Score 0 0 0 3  Altered sleeping 0  2 2  Tired, decreased energy 0  0 2  Change in appetite 1  3 2   Feeling bad or failure about yourself  0  0 0  Trouble concentrating 0  0 0  Moving slowly or fidgety/restless 0  0 0  Suicidal thoughts 0  0 0  PHQ-9 Score 1  5 9   Difficult doing work/chores Not difficult at all  Not difficult at all Not difficult at all      09/28/2024   11:04 AM 06/15/2024   11:09 AM 05/11/2024   10:06 AM  GAD 7 : Generalized Anxiety Score  Nervous, Anxious, on Edge 0 0 1  Control/stop worrying 0 0 1  Worry too much - different things 0 0 1  Trouble relaxing 0 0 1  Restless 0 0 1  Easily annoyed or irritable 0 0 0  Afraid - awful might happen 0 0 0  Total GAD 7 Score 0 0 5  Anxiety Difficulty Not difficult at all Not difficult at all Not difficult at all      Relevant past medical, surgical, family, and social history reviewed and updated as indicated.  Allergies and medications reviewed and updated. Data reviewed: Chart in Epic.   Past Medical History:  Diagnosis Date   Allergy  Anxiety    Atypical nevus 04/13/2002   Left Chest-Slight to Moderate edges free   BCC (basal cell carcinoma of skin) 10/07/2011   Right Inner Shin   Depression    CONTROLLED WITH OCCASIONAL XANAX    Fibrocystic breast disease    GERD (gastroesophageal reflux disease)    Hyperlipidemia    Osteopenia    Prediabetes 09/05/2014   Prediabetes 07/29/2018   Vitamin D  deficiency     Past Surgical History:  Procedure Laterality Date   BLADDER SUSPENSION N/A 09/10/2020   Procedure: TRANSVAGINAL TAPE (TVT) PROCEDURE;  Surgeon: Curlene Agent, MD;  Location: [facility];  Service: Gynecology;  Laterality:  N/A;   CARPAL TUNNEL RELEASE Right 12/06/2015   Procedure: RIGHT CARPAL TUNNEL RELEASE;  Surgeon: Franky Curia, MD;  Location: [facility];  Service: Orthopedics;  Laterality: Right;   CERVIX LESION DESTRUCTION     EARLY 20'S   CYSTOSCOPY N/A 09/10/2020   Procedure: CYSTOSCOPY;  Surgeon: Curlene Agent, MD;  Location: [facility];  Service: Gynecology;  Laterality: N/A;   DILATION AND CURETTAGE OF UTERUS  2006   HERNIA REPAIR     AGE 10   NOVASURE ABLATION  2006   SKIN CANCER EXCISION Right 10/2011   LOWER EXT BCC    Social History   Socioeconomic History   Marital status: Married    Spouse name: Not on file   Number of children: Not on file   Years of education: 14   Highest education level: Associate degree: academic program  Occupational History   Not on file  Tobacco Use   Smoking status: Former    Current packs/day: 0.00    Types: Cigarettes    Quit date: 03/13/1990    Years since quitting: 34.5   Smokeless tobacco: Never  Vaping Use   Vaping status: Never Used  Substance and Sexual Activity   Alcohol use: Yes    Comment: occ   Drug use: No   Sexual activity: Not Currently  Other Topics Concern   Not on file  Social History Narrative   Not on file   Social Drivers of Health   Financial Resource Strain: Low Risk  (08/31/2024)   Overall Financial Resource Strain (CARDIA)    Difficulty of Paying Living Expenses: Not hard at all  Food Insecurity: No Food Insecurity (08/31/2024)   Hunger Vital Sign    Worried About Running Out of Food in the Last Year: Never true    Ran Out of Food in the Last Year: Never true  Transportation Needs: No Transportation Needs (08/31/2024)   PRAPARE - Administrator, Civil Service (Medical): No    Lack of Transportation (Non-Medical): No  Physical Activity: Sufficiently Active (08/31/2024)   Exercise Vital Sign    Days of Exercise per Week: 7 days    Minutes of Exercise per Session: 60 min   Stress: No Stress Concern Present (08/31/2024)   [employer] of Occupational Health - Occupational Stress Questionnaire    Feeling of Stress: Not at all  Social Connections: Moderately Integrated (08/31/2024)   Social Connection and Isolation Panel    Frequency of Communication with Friends and Family: Three times a week    Frequency of Social Gatherings with Friends and Family: Once a week    Attends Religious Services: Never    Database administrator or Organizations: Yes    Attends Engineer, structural: More than 4 times per year    Marital  Status: Married  Catering manager Violence: Not At Risk (08/31/2024)   Humiliation, Afraid, Rape, and Kick questionnaire    Fear of Current or Ex-Partner: No    Emotionally Abused: No    Physically Abused: No    Sexually Abused: No    Outpatient Encounter Medications as of 09/28/2024  Medication Sig   Acetaminophen  (TYLENOL  PO) Take by mouth.   ALPRAZolam  (XANAX ) 0.5 MG tablet Take 1 tablet (0.5 mg total) by mouth 2 (two) times daily. AS NEEDED RARELY TAKES PER PT   cetirizine (ZYRTEC) 10 MG chewable tablet Chew 10 mg by mouth daily.   diphenhydrAMINE HCl (BENADRYL PO) Take by mouth.   ezetimibe  (ZETIA ) 10 MG tablet Take 1 tablet (10 mg total) by mouth daily.   Magnesium 400 MG TABS Take 400 mg by mouth daily. CURRENLTY 30 MG DAILY   meloxicam  (MOBIC ) 15 MG tablet Take 1 tab by mouth every other day for pain   Multiple Vitamins-Minerals (ONE A DAY WOMEN 50 PLUS) CHEW as directed Orally   omeprazole  (PRILOSEC) 40 MG capsule TAKE 1 CAPSULE BY MOUTH EVERY DAY TO PREVENT HEARTBURN & INDIGESTION   predniSONE  (DELTASONE ) 20 MG tablet Take 2 tablets (40 mg total) by mouth daily with breakfast for 5 days.   Probiotic Product (PROBIOTIC DAILY PO) Take by mouth daily.   rosuvastatin  (CRESTOR ) 5 MG tablet TAKE 1 TABLET (5 MG TOTAL) BY MOUTH DAILY.   zolpidem (AMBIEN) 10 MG tablet Take 10 mg by mouth at bedtime as needed for sleep.    escitalopram  (LEXAPRO ) 20 MG tablet Take 1 tablet (20 mg total) by mouth daily.   [DISCONTINUED] escitalopram  (LEXAPRO ) 20 MG tablet Take 1 tablet (20 mg total) by mouth daily.   No facility-administered encounter medications on file as of 09/28/2024.    Allergies  Allergen Reactions   Acyclovir And Related    Egg Protein-Containing Drug Products Hives    Patient states she eats eggs with no problem.  Received propofol  with no reaction.    Monascus Purpureus Went Yeast Other (See Comments)    MYALGIA with higher doses   Neosporin [Neomycin-Bacitracin Zn-Polymyx] Hives   Penicillins Other (See Comments)    Product containing penicillin (product)   Red Yeast Rice [Cholestin] Other (See Comments)    MYALGIA with higher doses   Sulfa Antibiotics Hives and Other (See Comments)   Zostavax [Zoster Vaccine Live] Rash    Pertinent ROS per HPI, otherwise unremarkable      Objective:  BP (!) 146/80   Pulse 61   Temp (!) 97 F (36.1 C)   Ht 5' 1 (1.549 m)   Wt 139 lb (63 kg)   SpO2 98%   BMI 26.26 kg/m    Wt Readings from Last 3 Encounters:  09/28/24 139 lb (63 kg)  08/31/24 136 lb (61.7 kg)  06/15/24 136 lb 3.2 oz (61.8 kg)    Physical Exam Vitals and nursing note reviewed.  Constitutional:      General: She is not in acute distress.    Appearance: Normal appearance. She is normal weight. She is not ill-appearing, toxic-appearing or diaphoretic.  HENT:     Head: Normocephalic and atraumatic.     Nose: Nose normal.     Mouth/Throat:     Mouth: Mucous membranes are moist.  Eyes:     Conjunctiva/sclera: Conjunctivae normal.     Pupils: Pupils are equal, round, and reactive to light.  Cardiovascular:     Rate and Rhythm: Normal rate and  regular rhythm.     Heart sounds: Normal heart sounds.  Pulmonary:     Effort: Pulmonary effort is normal.     Breath sounds: Normal breath sounds.  Musculoskeletal:     Cervical back: Neck supple.     Lumbar back: Tenderness present.  No swelling, edema, deformity, signs of trauma, lacerations, spasms or bony tenderness. Normal range of motion. Positive left straight leg raise test. Negative right straight leg raise test. Scoliosis present.     Right hip: Normal.     Left hip: Normal.     Right lower leg: No edema.     Left lower leg: No edema.     Comments: Bilateral hands: PIP joint nodules/swelling  Skin:    General: Skin is warm and dry.     Capillary Refill: Capillary refill takes less than 2 seconds.  Neurological:     General: No focal deficit present.     Mental Status: She is alert and oriented to person, place, and time.  Psychiatric:        Mood and Affect: Mood normal.        Behavior: Behavior normal.        Thought Content: Thought content normal.        Judgment: Judgment normal.      Results for orders placed or performed in visit on 06/15/24  BMP8+EGFR   Collection Time: 06/15/24 11:30 AM  Result Value Ref Range   Glucose 79 70 - 99 mg/dL   BUN 21 8 - 27 mg/dL   Creatinine, Ser 9.02 0.57 - 1.00 mg/dL   eGFR 64 >40 fO/fpw/8.26   BUN/Creatinine Ratio 22 12 - 28   Sodium 141 134 - 144 mmol/L   Potassium 4.7 3.5 - 5.2 mmol/L   Chloride 101 96 - 106 mmol/L   CO2 25 20 - 29 mmol/L   Calcium  10.3 8.7 - 10.3 mg/dL       Pertinent labs & imaging results that were available during my care of the patient were reviewed by me and considered in my medical decision making.  Assessment & Plan:  Sabriah was seen today for medical management of chronic issues.  Diagnoses and all orders for this visit:  Mixed hyperlipidemia -     TSH -     T4, free -     Bayer DCA Hb A1c Waived  Moderate major depression (HCC) -     TSH -     T4, free -     escitalopram  (LEXAPRO ) 20 MG tablet; Take 1 tablet (20 mg total) by mouth daily.  Generalized anxiety disorder -     TSH -     T4, free -     escitalopram  (LEXAPRO ) 20 MG tablet; Take 1 tablet (20 mg total) by mouth daily.  Prediabetes -      TSH  Lumbar pain with radiation down left leg -     predniSONE  (DELTASONE ) 20 MG tablet; Take 2 tablets (40 mg total) by mouth daily with breakfast for 5 days. -     Ambulatory referral to Neurosurgery  Stiffness of joints of both hands -     predniSONE  (DELTASONE ) 20 MG tablet; Take 2 tablets (40 mg total) by mouth daily with breakfast for 5 days.  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 Symptoms include numbness, itching, and burning pain radiating down the left leg. No bowel or bladder dysfunction, falls, or saddle anesthesia reported. Current management includes alternating use of Motrin and ibuprofen. Recent chiropractic care provided temporary relief. - Prescribe a burst of prednisone  to reduce inflammation and alleviate nerve impingement. - Refer to neurosurgery for further evaluation and management. - Advise to report any worsening of symptoms or new developments.  Osteoarthritis of hand Recent development of arthritic nodules in the hands, with mild stiffness. Family history of rheumatoid arthritis, but previous rheumatology evaluation indicated osteoarthritis. Engages in activities that keep hands moving, which may help with stiffness. Prednisone  prescribed for sciatica may also benefit hand symptoms. - Prescribe prednisone , which may also help with hand joint swelling and stiffness. - Advise to report if symptoms worsen.  Generalized anxiety disorder and depression Well-managed on Lexapro  with no reported side effects. - Continue Lexapro  and send refills.  Prediabetes A1c is 5.4, indicating good control and below the prediabetes range. No immediate concerns regarding blood sugar levels. - Plan for A1c re-evaluation in six months unless symptoms  change or worsen.  General Health Maintenance Discussion about colonoscopy screening. Previous colonoscopies were performed by a retired Development worker, community. Interest in pursuing a colonoscopy with a new provider. Engages in regular physical activity, including walking, despite recent pain limitations. Exploring anti-inflammatory dietary options and supplements like turmeric for overall health benefits. - Refer to [facility] for colonoscopy scheduling. - Encourage continuation of physical activity as tolerated. - Discuss potential benefits of an anti-inflammatory diet and supplements like turmeric.          Continue all other maintenance medications.  Follow up plan: Return in 6 months (on 03/29/2025), or if symptoms worsen or fail to improve, for chronic follow up.   Continue healthy lifestyle choices, including diet (rich in fruits, vegetables, and lean proteins, and low in salt and simple carbohydrates) and exercise (at least 30 minutes of moderate physical activity daily).  Educational handout given for sciatica, prediabetes   The above assessment and management plan was discussed with the patient. The patient verbalized understanding of and has agreed to the management plan. Patient is aware to call the clinic if they develop any new symptoms or if symptoms persist or worsen. Patient is aware when to return to the clinic for a follow-up visit. Patient educated on when it is appropriate to go to the emergency department.   Rosaline Bruns, FNP-C [facility] [phone]

## 2024-09-29 ENCOUNTER — Ambulatory Visit: Payer: Self-pay | Admitting: Family Medicine

## 2024-09-29 LAB — T4, FREE: Free T4: 1.16 ng/dL (ref 0.82–1.77)

## 2024-09-29 LAB — TSH: TSH: 0.946 u[IU]/mL (ref 0.450–4.500)

## 2024-10-13 NOTE — Progress Notes (Signed)
 Referring Physician:  Severa Rock HERO, FNP [address] [place],  [place] [address]  Primary Physician:  Severa Rock HERO, FNP  History of Present Illness: 10/19/2024 [name] has a history of GERD, osteopenia, hyperlipidemia, depression, prediabetes, GAD.   1 year history of constant LBP with intermittent left lateral leg pain to above her knee. No right leg pain. Leg pain can be burning, itching, or tingling. Pain is worse with walking and standing. Pain is better with stretching, ice, and heat. She has numbness and tingling in left leg.   Has seen chiropractor- was given HEP. Has done yoga in the past as well as Silver Sneakers.   No relief with muscle relaxers. Had relief with steroid pack.   Tobacco use: Does not smoke.   Bowel/Bladder Dysfunction: none  Conservative measures:  Physical therapy: Has not participated in.   Multimodal medical therapy including regular antiinflammatories: Motrin, Ibuprofen, Prednisone , Meloxicam , Flexeril  Injections: No epidural steroid injections.  Past Surgery: no spine surgery  Norma Cameron has no symptoms of cervical myelopathy.  The symptoms are causing a significant impact on the patient's life.   Review of Systems:  A 10 point review of systems is negative, except for the pertinent positives and negatives detailed in the HPI.  Past Medical History: Past Medical History:  Diagnosis Date   Allergy    Anxiety    Atypical nevus 04/13/2002   Left Chest-Slight to Moderate edges free   BCC (basal cell carcinoma of skin) 10/07/2011   Right Inner Shin   Depression    CONTROLLED WITH OCCASIONAL XANAX    Fibrocystic breast disease    GERD (gastroesophageal reflux disease)    Hyperlipidemia    Osteopenia    Prediabetes 09/05/2014   Prediabetes 07/29/2018   Vitamin D  deficiency     Past Surgical History: Past Surgical History:  Procedure Laterality Date   BLADDER SUSPENSION N/A 09/10/2020   Procedure: TRANSVAGINAL TAPE (TVT)  PROCEDURE;  Surgeon: Curlene Agent, MD;  Location: [facility];  Service: Gynecology;  Laterality: N/A;   CARPAL TUNNEL RELEASE Right 12/06/2015   Procedure: RIGHT CARPAL TUNNEL RELEASE;  Surgeon: Franky Curia, MD;  Location: [facility];  Service: Orthopedics;  Laterality: Right;   CERVIX LESION DESTRUCTION     EARLY 20'S   CYSTOSCOPY N/A 09/10/2020   Procedure: CYSTOSCOPY;  Surgeon: Curlene Agent, MD;  Location: [facility];  Service: Gynecology;  Laterality: N/A;   DILATION AND CURETTAGE OF UTERUS  2006   HERNIA REPAIR     AGE 23   NOVASURE ABLATION  2006   SKIN CANCER EXCISION Right 10/2011   LOWER EXT BCC    Allergies: Allergies as of 10/19/2024 - Review Complete 10/19/2024  Allergen Reaction Noted   Acyclovir and related  12/06/2015   Egg protein-containing drug products Hives 09/27/2013   Monascus purpureus went yeast Other (See Comments) 10/31/2015   Neosporin [neomycin-bacitracin zn-polymyx] Hives 09/27/2013   Penicillins Other (See Comments) 12/26/2020   Red yeast rice [cholestin] Other (See Comments) 09/27/2013   Sulfa antibiotics Hives and Other (See Comments) 09/27/2013   Zostavax [zoster vaccine live] Rash 09/27/2013    Medications: Outpatient Encounter Medications as of 10/19/2024  Medication Sig   Acetaminophen  (TYLENOL  PO) Take by mouth.   ALPRAZolam  (XANAX ) 0.5 MG tablet Take 1 tablet (0.5 mg total) by mouth 2 (two) times daily. AS NEEDED RARELY TAKES PER PT   cetirizine (ZYRTEC) 10 MG chewable tablet Chew 10 mg by mouth  daily.   diphenhydrAMINE HCl (BENADRYL PO) Take by mouth.   escitalopram  (LEXAPRO ) 20 MG tablet Take 1 tablet (20 mg total) by mouth daily.   ezetimibe  (ZETIA ) 10 MG tablet Take 1 tablet (10 mg total) by mouth daily.   Magnesium 400 MG TABS Take 400 mg by mouth daily. CURRENLTY 30 MG DAILY   meloxicam  (MOBIC ) 15 MG tablet Take 1 tab by mouth every other day for pain   Multiple Vitamins-Minerals (ONE A  DAY WOMEN 50 PLUS) CHEW as directed Orally   omeprazole  (PRILOSEC) 40 MG capsule TAKE 1 CAPSULE BY MOUTH EVERY DAY TO PREVENT HEARTBURN & INDIGESTION   Probiotic Product (PROBIOTIC DAILY PO) Take by mouth daily.   rosuvastatin  (CRESTOR ) 5 MG tablet TAKE 1 TABLET (5 MG TOTAL) BY MOUTH DAILY.   zolpidem (AMBIEN) 10 MG tablet Take 10 mg by mouth at bedtime as needed for sleep.   No facility-administered encounter medications on file as of 10/19/2024.    Social History: Social History   Tobacco Use   Smoking status: Former    Current packs/day: 0.00    Types: Cigarettes    Quit date: 03/13/1990    Years since quitting: 34.6   Smokeless tobacco: Never  Vaping Use   Vaping status: Never Used  Substance Use Topics   Alcohol use: Yes    Comment: occ   Drug use: No    Family Medical History: Family History  Problem Relation Age of Onset   Hypertension Mother    Hyperlipidemia Mother    Rheum arthritis Mother    Brain cancer Mother    Heart disease Father        CABG   Hyperlipidemia Father    Hypertension Father    Cancer Paternal Uncle        THROAT   Heart disease Paternal Grandfather    Diabetes Paternal Grandfather     Physical Examination: Vitals:   10/19/24 0844  BP: 118/78    General: Patient is well developed, well nourished, calm, collected, and in no apparent distress. Attention to examination is appropriate.  Respiratory: Patient is breathing without any difficulty.   NEUROLOGICAL:     Awake, alert, oriented to person, place, and time.  Speech is clear and fluent. Fund of knowledge is appropriate.   Cranial Nerves: Pupils equal round and reactive to light.  Facial tone is symmetric.    Mild lower lumbar tenderness.   No abnormal lesions on exposed skin.   Strength: Side Biceps Triceps Deltoid Interossei Grip Wrist Ext. Wrist Flex.  R 5 5 5 5 5 5 5   L 5 5 5 5 5 5 5    Side Iliopsoas Quads Hamstring PF DF EHL  R 5 5 5 5 5 5   L 5 5 5 5 5 5     Reflexes are 2+ and symmetric at the biceps, brachioradialis, patella and achilles.   Hoffman's is absent.  Clonus is not present.   Bilateral upper and lower extremity sensation is intact to light touch.     No pain with IR/ER of both hips.   Gait is normal.     Medical Decision Making  Imaging: Lumbar xrays dated 10/23/23:  FINDINGS: Five lumbar type vertebral bodies. Severe lumbar scoliosis convex towards the left. No anterior subluxations. Normal alignment of the posterior elements. No vertebral compression deformities. No focal bone lesion or bone destruction. Degenerative changes with disc space narrowing and mild endplate osteophyte formation throughout. Visualized sacrum appears intact.   IMPRESSION: Mild  degenerative changes in severe scoliosis of the lumbar spine. No acute displaced fractures identified.     Electronically Signed   By: Elsie Gravely M.D.   On: 11/10/2023 23:38  I have personally reviewed the images and agree with the above interpretation.  Assessment and Plan: Ms. Gruenberg has a 1 year history of constant LBP with intermittent left lateral leg pain to above her knee. No right leg pain. Leg pain can be burning, itching, or tingling.   She has known significant left sided lumbar curve with spondylosis.   Treatment options discussed with patient and following plan made:   - Full length scoliosis xrays to be done at [facility].  - Lumbar MRI ordered. She requests [facility] at [facility].  - She has mobic  and prn flexeril  to take from other providers.  - Will schedule phone visit to discuss above results once I have them back.   I spent a total of 35 minutes in face-to-face and non-face-to-face activities related to this patient's care today including review of outside records, review of imaging, review of symptoms, physical exam, discussion of differential diagnosis, discussion of treatment options, and documentation.   Thank you for  involving me in the care of this patient.   Glade Boys PA-C Dept. of Neurosurgery

## 2024-10-19 ENCOUNTER — Ambulatory Visit
Admission: RE | Admit: 2024-10-19 | Discharge: 2024-10-19 | Disposition: A | Discharge status: Home / Self Care | Source: Ambulatory Visit | Attending: Orthopedic Surgery | Admitting: Orthopedic Surgery

## 2024-10-19 ENCOUNTER — Encounter: Payer: Self-pay | Admitting: Orthopedic Surgery

## 2024-10-19 ENCOUNTER — Ambulatory Visit: Admitting: Orthopedic Surgery

## 2024-10-19 VITALS — BP 118/78 | Wt 143.6 lb

## 2024-10-19 DIAGNOSIS — M5416 Radiculopathy, lumbar region: Secondary | ICD-10-CM

## 2024-10-19 DIAGNOSIS — M4120 Other idiopathic scoliosis, site unspecified: Secondary | ICD-10-CM

## 2024-10-19 DIAGNOSIS — M47816 Spondylosis without myelopathy or radiculopathy, lumbar region: Secondary | ICD-10-CM | POA: Insufficient documentation

## 2024-10-19 NOTE — Patient Instructions (Signed)
 It was so nice to see you today. Thank you so much for coming in.    You have a curve in your lower back (scoliosis) along with wear and tear.   I want to get an MRI of your lower back to look into things further. We will get this approved through your insurance and [facility] will call you to schedule the appointment. Ask about your patient responsibility. You do not need to pay this prior to getting MRI, they can bill you.   Make sure you are scheduled for the WIDE BORE MRI.  After you have the MRI, it can take 14-28 days for me to get the results back. If I don't have them in 2 weeks, we will call to try to get the results.   I also want to get full length scoliosis xrays of your back. These need to be done at the hospital. You do not need an appointment.   Once I have the results, we will call you to schedule a follow up phone visit with me to review them.   Please do not hesitate to call if you have any questions or concerns. You can also message me in MyChart.   Glade Boys PA-C [phone]     The physicians and staff at [facility] at [facility] are committed to providing excellent care. You may receive a survey asking for feedback about your experience at our office. We value you your feedback and appreciate you taking the time to to fill it out. The [facility] leadership team is also available to discuss your experience in person, feel free to contact us  [phone].

## 2024-11-06 ENCOUNTER — Ambulatory Visit
Admission: RE | Admit: 2024-11-06 | Discharge: 2024-11-06 | Disposition: A | Source: Ambulatory Visit | Attending: Orthopedic Surgery | Admitting: Orthopedic Surgery

## 2024-11-06 DIAGNOSIS — M5416 Radiculopathy, lumbar region: Secondary | ICD-10-CM

## 2024-11-06 DIAGNOSIS — M47816 Spondylosis without myelopathy or radiculopathy, lumbar region: Secondary | ICD-10-CM

## 2024-11-06 DIAGNOSIS — M4120 Other idiopathic scoliosis, site unspecified: Secondary | ICD-10-CM

## 2024-11-09 ENCOUNTER — Telehealth: Admitting: Physician Assistant

## 2024-11-09 DIAGNOSIS — B9789 Other viral agents as the cause of diseases classified elsewhere: Secondary | ICD-10-CM

## 2024-11-09 MED ORDER — IPRATROPIUM BROMIDE 0.03 % NA SOLN: 2.0000 | Freq: Two times a day (BID) | NASAL | 0 refills | Status: AC

## 2024-11-09 NOTE — Progress Notes (Signed)
 We are sorry that you are not feeling well.  Here is how we plan to help!  Based on what you have shared with me it looks like you have sinusitis.  Sinusitis is inflammation and infection in the sinus cavities of the head.  Based on your presentation I believe you most likely have Acute Viral Sinusitis.This is an infection most likely caused by a virus. There is not specific treatment for viral sinusitis other than to help you with the symptoms until the infection runs its course.  You may use an oral decongestant such as Mucinex D or if you have glaucoma or high blood pressure use plain Mucinex. Saline nasal spray help and can safely be used as often as needed for congestion, I have prescribed: Ipratropium Bromide nasal spray 0.03% 2 sprays in eah nostril 2-3 times a day  Some authorities believe that zinc sprays or the use of Echinacea may shorten the course of your symptoms.  Sinus infections are not as easily transmitted as other respiratory infection, however we still recommend that you avoid close contact with loved ones, especially the very young and elderly.  Remember to wash your hands thoroughly throughout the day as this is the number one way to prevent the spread of infection!  Home Care: Only take medications as instructed by your medical team. Do not take these medications with alcohol. A steam or ultrasonic humidifier can help congestion.  You can place a towel over your head and breathe in the steam from hot water coming from a faucet. Avoid close contacts especially the very young and the elderly. Cover your mouth when you cough or sneeze. Always remember to wash your hands.  Get Help Right Away If: You develop worsening fever or sinus pain. You develop a severe head ache or visual changes. Your symptoms persist after you have completed your treatment plan.  Make sure you Understand these instructions. Will watch your condition. Will get help right away if you are not doing  well or get worse.  Your e-visit answers were reviewed by a board certified advanced clinical practitioner to complete your personal care plan.  Depending on the condition, your plan could have included both over the counter or prescription medications.  If there is a problem please reply  once you have received a response from your provider.  Your safety is important to us .  If you have drug allergies check your prescription carefully.    You can use MyChart to ask questions about today's visit, request a non-urgent call back, or ask for a work or school excuse for 24 hours related to this e-Visit. If it has been greater than 24 hours you will need to follow up with your provider, or enter a new e-Visit to address those concerns.  You will get an e-mail in the next two days asking about your experience.  I hope that your e-visit has been valuable and will speed your recovery. Thank you for using e-visits.  I have spent 5 minutes in review of e-visit questionnaire, review and updating patient chart, medical decision making and response to patient.   Elsie Velma Lunger, PA-C

## 2024-11-11 NOTE — Progress Notes (Unsigned)
 My Chart Video Visit- Progress Note: Referring Physician:  Severa Rock HERO, FNP [address] [place],  [place] [address]  Primary Physician:  Severa Rock HERO, FNP  This visit was performed via MyChart/video.   Patient location: home Provider location: office  I spent a total of *** minutes non-face-to-face activities for this visit on the date of this encounter including review of current clinical condition and response to treatment.    Patient has given verbal consent to this MyChart video visit and we reviewed the limitations of a MyChart video visit. Patient wishes to proceed.    Chief Complaint:  review imaging  History of Present Illness: Norma Cameron is a 68 y.o. female has a history of  GERD, osteopenia, hyperlipidemia, depression, prediabetes, GAD.   Last seen by me on 10/19/24 for constant LBP and intermittent left leg pain. She has known significant left sided lumbar curve with spondylosis.   Lumbar MRI and scoliosis xrays were ordered and MyChart visit is scheduled to review them.        1 year history of constant LBP with intermittent left lateral leg pain to above her knee. No right leg pain. Leg pain can be burning, itching, or tingling. Pain is worse with walking and standing. Pain is better with stretching, ice, and heat. She has numbness and tingling in left leg.    Has seen chiropractor- was given HEP. Has done yoga in the past as well as Silver Sneakers.    No relief with muscle relaxers. Had relief with steroid pack.    Tobacco use: Does not smoke.    Bowel/Bladder Dysfunction: none   Conservative measures:  Physical therapy: Has not participated in.   Multimodal medical therapy including regular antiinflammatories: Motrin, Ibuprofen, Prednisone , Meloxicam , Flexeril  Injections: No epidural steroid injections.   Past Surgery: no spine surgery   The symptoms are causing a significant impact on the patient's life.     Exam: General: Patient is  well developed, well nourished, calm, collected, and in no apparent distress. Attention to examination is appropriate.  Respiratory: Patient is breathing without any difficulty.    Awake, alert, oriented to person, place, and time.  Speech is clear and fluent. Fund of knowledge is appropriate.   ***    Imaging: Lumbar MRI dated 11/06/24:  FINDINGS:   BONES AND ALIGNMENT: 5 lumbar type vertebrae. Severe levoscoliosis with apex at L2. Trace retrolisthesis of L1 on L2 and L2 on L3. No fracture, suspicious marrow lesion, or significant marrow edema.   SPINAL CORD: The conus medullaris terminates at T12-L1 and is normal in signal.   SOFT TISSUES: No paraspinal mass.   DISC LEVELS: Disc desiccation and disc space narrowing throughout the lumbar spine, including asymmetrically severe narrowing on the right at L1-L2 and L2-L3 and on the left at L5-S1.   T11-T12: Moderate left facet arthrosis without disc herniation or stenosis.   T12-L1: Minimal disc bulging and mild facet arthrosis without stenosis.   L1-L2: Disc bulging and moderate facet arthrosis result in mild right lateral recess stenosis without spinal or neural foraminal stenosis.   L2-L3: Right eccentric disc bulging, a right subarticular disc protrusion, and moderate facet arthrosis result in mild to moderate right lateral recess and right neural foraminal stenosis without spinal stenosis. Potential right L3 nerve root impingement.   L3-L4: Disc bulging and moderate facet arthrosis result in mild left lateral recess stenosis without spinal or neural foraminal stenosis.   L4-L5: Mild disc bulging and moderate facet arthrosis  result in mild left lateral recess stenosis without spinal or neural foraminal stenosis.   L5-S1: Mild disc bulging eccentric to the left and severe facet arthrosis result in mild left neural foraminal stenosis without spinal stenosis.   IMPRESSION: 1. Severe lumbar levoscoliosis with  diffuse disc and facet degeneration. No significant spinal stenosis. 2. Mild to moderate right lateral recess and right neural foraminal stenosis at L2-L3. 3. Mild right lateral recess stenosis at L1-L2. 4. Mild left lateral recess stenosis at L3-L4 and L4-L5. 5. Mild left neural foraminal stenosis at L5-S1.   Electronically signed by: Dasie Hamburg MD 11/11/2024 08:25 AM EST RP Workstation: [id number]    Scoliosis xrays dated 10/19/24:  FINDINGS:   BONES: There are 12 rib-bearing thoracic vertebrae. There are 5 lumbar-type vertebrae without transitional anatomy. Osteopenia.   No acute fracture or focal osseous lesion. No spinal compression fractures in the thoracic and lumbar spine.   In the thoracic spine, there is kyphodextroscoliosis. The dextro component 20 degrees as measured from the superior endplate of T4 to the lower endplate of T11. The kypho component is 41 degrees and centered at T7-T8.   In the lumbar spine, there is a significant levorotary scoliosis measuring 33 degrees from the T11 upper plate to the lower plate of L5, apex at L2.   The lateral view demonstrates no appreciable listhesis.   The SI joints and hip joints are unremarkable.   DISCS AND DEGENERATIVE CHANGES: There is thoracic and lumbar spondylosis. The thoracic discs are degenerated.   In the lumbar spine, the discs are normal in height at the lowest 2 levels, moderately degenerated at the remaining levels.   There is hypertrophic facet arthropathy from L2-L3 down with asymmetries.   SOFT TISSUES: The lungs are clear apart from linear atelectasis or scarring in the left base. There is a moderate to large-sized hiatal hernia.   There is aortic atherosclerosis. The cardiac size is normal. No acute radiographic findings are seen in the abdomen. No pathologic calcifications. No bowel obstruction.   IMPRESSION: 1. 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 2. 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 3. Moderate to large hiatal hernia. 4. Aortic atherosclerosis.   Electronically signed by: Francis Quam MD 10/21/2024 02:01 AM EST RP Workstation: [id number]  I have personally reviewed the images and agree with the above interpretation.  Assessment and Plan: Ms. Schum has a 1 year history of constant LBP with intermittent left lateral leg pain to above her knee. No right leg pain. Leg pain can be burning, itching, or tingling.    She has known significant left sided lumbar curve with spondylosis.    Treatment options discussed with patient and following plan made:    - Full length scoliosis xrays to be done at [facility].  - Lumbar MRI ordered. She requests [facility] at [facility].  - She has mobic  and prn flexeril  to take from other providers.  - Will schedule phone visit to discuss above results once I have them back.   Glade Boys PA-C Neurosurgery

## 2024-11-14 ENCOUNTER — Encounter: Payer: Self-pay | Admitting: Family

## 2024-11-14 ENCOUNTER — Ambulatory Visit: Admitting: Family

## 2024-11-14 ENCOUNTER — Ambulatory Visit: Payer: Self-pay

## 2024-11-14 VITALS — BP 162/85 | HR 72 | Temp 97.0°F | Ht 61.0 in | Wt 145.0 lb

## 2024-11-14 DIAGNOSIS — J0100 Acute maxillary sinusitis, unspecified: Secondary | ICD-10-CM

## 2024-11-14 MED ORDER — DOXYCYCLINE MONOHYDRATE 100 MG PO TABS: 100.0000 mg | ORAL_TABLET | Freq: Two times a day (BID) | ORAL | 0 refills | Status: AC

## 2024-11-14 NOTE — Telephone Encounter (Signed)
Noted  -LS

## 2024-11-14 NOTE — Progress Notes (Signed)
 Subjective:    Patient ID: Norma Cameron, female    DOB: 04-Feb-1956, 68 y.o.   MRN: [id number]  Chief Complaint  Patient presents with   Sinusitis    Started last Monday    PT presents to the office today with sinus pressure and congestion that started last week. Reports her symptoms have worsen. Sinusitis This is a new problem. The current episode started 1 to 4 weeks ago. The problem has been gradually worsening since onset. There has been no fever. Her pain is at a severity of 3/10. The pain is mild. Associated symptoms include congestion, coughing, ear pain, headaches, a hoarse voice, sinus pressure and sneezing. Pertinent negatives include no shortness of breath or sore throat. Past treatments include acetaminophen  and oral decongestants (flonase). The treatment provided mild relief.      Review of Systems  HENT:  Positive for congestion, ear pain, hoarse voice, sinus pressure and sneezing. Negative for sore throat.   Respiratory:  Positive for cough. Negative for shortness of breath.   Neurological:  Positive for headaches.  All other systems reviewed and are negative.   Social History   Socioeconomic History   Marital status: Married    Spouse name: Not on file   Number of children: Not on file   Years of education: 14   Highest education level: Associate degree: academic program  Occupational History   Not on file  Tobacco Use   Smoking status: Former    Current packs/day: 0.00    Types: Cigarettes    Quit date: 03/13/1990    Years since quitting: 34.6   Smokeless tobacco: Never  Vaping Use   Vaping status: Never Used  Substance and Sexual Activity   Alcohol use: Yes    Comment: occ   Drug use: No   Sexual activity: Not Currently  Other Topics Concern   Not on file  Social History Narrative   Not on file   Social Drivers of Health   Financial Resource Strain: Low Risk  (08/31/2024)   Overall Financial Resource Strain (CARDIA)    Difficulty of Paying Living  Expenses: Not hard at all  Food Insecurity: No Food Insecurity (08/31/2024)   Hunger Vital Sign    Worried About Running Out of Food in the Last Year: Never true    Ran Out of Food in the Last Year: Never true  Transportation Needs: No Transportation Needs (08/31/2024)   PRAPARE - Administrator, Civil Service (Medical): No    Lack of Transportation (Non-Medical): No  Physical Activity: Sufficiently Active (08/31/2024)   Exercise Vital Sign    Days of Exercise per Week: 7 days    Minutes of Exercise per Session: 60 min  Stress: No Stress Concern Present (08/31/2024)   [employer] of Occupational Health - Occupational Stress Questionnaire    Feeling of Stress: Not at all  Social Connections: Moderately Integrated (08/31/2024)   Social Connection and Isolation Panel    Frequency of Communication with Friends and Family: Three times a week    Frequency of Social Gatherings with Friends and Family: Once a week    Attends Religious Services: Never    Database Administrator or Organizations: Yes    Attends Engineer, Structural: More than 4 times per year    Marital Status: Married   Family History  Problem Relation Age of Onset   Hypertension Mother    Hyperlipidemia Mother    Rheum arthritis Mother  Brain cancer Mother    Heart disease Father        CABG   Hyperlipidemia Father    Hypertension Father    Cancer Paternal Uncle        THROAT   Heart disease Paternal Grandfather    Diabetes Paternal Grandfather         Objective:   Physical Exam Vitals reviewed.  Constitutional:      General: She is not in acute distress.    Appearance: She is well-developed.  HENT:     Head: Normocephalic and atraumatic.     Right Ear: Tympanic membrane normal.     Left Ear: Tympanic membrane normal.     Nose:     Right Sinus: Maxillary sinus tenderness present.     Left Sinus: Maxillary sinus tenderness present.  Eyes:     Pupils: Pupils are equal, round, and  reactive to light.  Neck:     Thyroid : No thyromegaly.  Cardiovascular:     Rate and Rhythm: Normal rate and regular rhythm.     Heart sounds: Normal heart sounds. No murmur heard. Pulmonary:     Effort: Pulmonary effort is normal. No respiratory distress.     Breath sounds: Rhonchi present. No wheezing.  Abdominal:     General: Bowel sounds are normal. There is no distension.     Palpations: Abdomen is soft.     Tenderness: There is no abdominal tenderness.  Musculoskeletal:        General: No tenderness. Normal range of motion.     Cervical back: Normal range of motion and neck supple.  Skin:    General: Skin is warm and dry.  Neurological:     Mental Status: She is alert and oriented to person, place, and time.     Cranial Nerves: No cranial nerve deficit.     Deep Tendon Reflexes: Reflexes are normal and symmetric.  Psychiatric:        Behavior: Behavior normal.        Thought Content: Thought content normal.        Judgment: Judgment normal.       BP (!) 162/85   Pulse 72   Temp (!) 97 F (36.1 C) (Temporal)   Ht 5' 1 (1.549 m)   Wt 145 lb (65.8 kg)   SpO2 98%   BMI 27.40 kg/m      Assessment & Plan:  Tonga Prout comes in today with chief complaint of Sinusitis (Started last Monday )   Diagnosis and orders addressed:  1. Acute non-recurrent maxillary sinusitis (Primary) - Start doxycycline   - Use a cool mist humidifier  -Use saline nose sprays frequently -Force fluids -For any cough or congestion  Use plain Mucinex- regular strength or max strength is fine -For fever or aces or pains- take tylenol  or ibuprofen. -Throat lozenges if help -Follow up if symptoms worsen or do not improve  - doxycycline  (ADOXA) 100 MG tablet; Take 1 tablet (100 mg total) by mouth 2 (two) times daily.  Dispense: 20 tablet; Refill: 0     Bari Learn, FNP

## 2024-11-14 NOTE — Telephone Encounter (Signed)
 FYI Only or Action Required?: FYI only for provider: appointment scheduled on 11/14/24.  Patient was last seen in primary care on 09/28/2024 by Severa Rock HERO, FNP.  Called Nurse Triage reporting Sinusitis.  Symptoms began a week ago.  Interventions attempted: OTC medications: Mucinex, Benadryl and Prescription medications: ipatropium bromide nasal spray.  Symptoms are: gradually worsening.  Triage Disposition: See Physician Within 24 Hours  Patient/caregiver understands and will follow disposition?: Yes          Copied from CRM [id number]. Topic: Clinical - Red Word Triage >> Nov 14, 2024  8:03 AM Diannia H wrote: Red Word that prompted transfer to Nurse Triage: Patient called in because she think she has a sinus infection. There is blood in her mucus. She had a video visit 12/03 and was given some nasal spray and it seems like its making it worse. She has a headache and her ears are hurting a little bit but not much. She is coughing to the point she is not getting sleep. Reason for Disposition  Earache  Answer Assessment - Initial Assessment Questions 1. LOCATION: Where does it hurt?      Across forehead.  2. ONSET: When did the sinus pain start?  (e.g., hours, days)      11/07/24; patient did a telehealth visit on 11/09/24 (states symptoms improved for about a day and then worsened)  3. SEVERITY: How bad is the pain?   (Scale 0-10; or none, mild, moderate or severe)     3/10.  4. RECURRENT SYMPTOM: Have you ever had sinus problems before? If Yes, ask: When was the last time? and What happened that time?      Yes, she states she gets a sinus infection about once a year.  5. NASAL CONGESTION: Is the nose blocked? If Yes, ask: Can you open it or must you breathe through your mouth?     Very congested. She states she can get some out but it feels like there is more there.  6. NASAL DISCHARGE: Do you have discharge from your nose? If so ask, What color?      Yes, some bloody dried specks and green.  7. FEVER: Do you have a fever? If Yes, ask: What is it, how was it measured, and when did it start?      No.  8. OTHER SYMPTOMS: Do you have any other symptoms? (e.g., sore throat, cough, earache, difficulty breathing)     Bilateral ear pain, productive cough with green mucous. No difficulty breathing or sore throat.  Protocols used: Sinus Pain or Congestion-A-AH

## 2024-11-14 NOTE — Patient Instructions (Signed)

## 2024-11-17 ENCOUNTER — Telehealth: Admitting: Orthopedic Surgery

## 2024-11-17 ENCOUNTER — Encounter: Payer: Self-pay | Admitting: Orthopedic Surgery

## 2024-11-17 DIAGNOSIS — M5416 Radiculopathy, lumbar region: Secondary | ICD-10-CM

## 2024-11-17 DIAGNOSIS — M51362 Other intervertebral disc degeneration, lumbar region with discogenic back pain and lower extremity pain: Secondary | ICD-10-CM | POA: Diagnosis not present

## 2024-11-17 DIAGNOSIS — M4120 Other idiopathic scoliosis, site unspecified: Secondary | ICD-10-CM

## 2024-11-17 DIAGNOSIS — M48061 Spinal stenosis, lumbar region without neurogenic claudication: Secondary | ICD-10-CM | POA: Diagnosis not present

## 2024-11-17 DIAGNOSIS — M47816 Spondylosis without myelopathy or radiculopathy, lumbar region: Secondary | ICD-10-CM

## 2024-11-17 DIAGNOSIS — M4726 Other spondylosis with radiculopathy, lumbar region: Secondary | ICD-10-CM

## 2024-11-18 ENCOUNTER — Telehealth: Admitting: Orthopedic Surgery

## 2024-11-22 ENCOUNTER — Telehealth: Payer: Self-pay | Admitting: Family Medicine

## 2024-11-22 NOTE — Telephone Encounter (Signed)
 Appt scheduled for 11/25/2024

## 2024-11-22 NOTE — Telephone Encounter (Unsigned)
 Copied from CRM [id number]. Topic: Clinical - Medical Advice >> Nov 22, 2024  9:04 AM Cherylann RAMAN wrote: Reason for CRM: Patient was in pain yesterday but is feeling better today. Patient states she was informed to follow up with her provider regarding her hypertension. Patient states her hernia is giving her flatulence and indigestion. Patient is out of town today but can be contacted at [phone].

## 2024-11-25 ENCOUNTER — Encounter: Payer: Self-pay | Admitting: Family Medicine

## 2024-11-25 ENCOUNTER — Ambulatory Visit: Admitting: Family Medicine

## 2024-11-25 VITALS — BP 130/75 | HR 77 | Temp 97.6°F | Ht 61.0 in | Wt 142.2 lb

## 2024-11-25 DIAGNOSIS — K449 Diaphragmatic hernia without obstruction or gangrene: Secondary | ICD-10-CM | POA: Insufficient documentation

## 2024-11-25 DIAGNOSIS — M25551 Pain in right hip: Secondary | ICD-10-CM

## 2024-11-25 DIAGNOSIS — M25552 Pain in left hip: Secondary | ICD-10-CM

## 2024-11-25 DIAGNOSIS — K219 Gastro-esophageal reflux disease without esophagitis: Secondary | ICD-10-CM | POA: Diagnosis not present

## 2024-11-25 DIAGNOSIS — M545 Low back pain, unspecified: Secondary | ICD-10-CM

## 2024-11-25 DIAGNOSIS — E782 Mixed hyperlipidemia: Secondary | ICD-10-CM

## 2024-11-25 MED ORDER — MELOXICAM 15 MG PO TABS: ORAL_TABLET | ORAL | 3 refills | Status: AC

## 2024-11-25 MED ORDER — ROSUVASTATIN CALCIUM 5 MG PO TABS: 5.0000 mg | ORAL_TABLET | Freq: Every day | ORAL | 1 refills | Status: AC

## 2024-11-25 MED ORDER — EZETIMIBE 10 MG PO TABS: 10.0000 mg | ORAL_TABLET | Freq: Every day | ORAL | 1 refills | Status: AC

## 2024-11-25 MED ORDER — PANTOPRAZOLE SODIUM 40 MG PO TBEC: 40.0000 mg | DELAYED_RELEASE_TABLET | Freq: Every day | ORAL | 3 refills | Status: AC

## 2024-11-25 NOTE — Patient Instructions (Signed)
 Goal BP:  Less than 130/80  Take your medications faithfully as prescribed. Maintain a healthy weight. Get at least 150 minutes of aerobic exercise per week. Minimize salt intake, less than 2000 mg per day. Minimize alcohol intake.  DASH Eating Plan DASH stands for Dietary Approaches to Stop Hypertension. The DASH eating plan is a healthy eating plan that has been shown to reduce high blood pressure (hypertension). Additional health benefits may include reducing the risk of type 2 diabetes mellitus, heart disease, and stroke. The DASH eating plan may also help with weight loss.  WHAT DO I NEED TO KNOW ABOUT THE DASH EATING PLAN? For the DASH eating plan, you will follow these general guidelines: Choose foods with a percent daily value for sodium of less than 5% (as listed on the food label). Use salt-free seasonings or herbs instead of table salt or sea salt. Check with your health care provider or pharmacist before using salt substitutes. Eat lower-sodium products, often labeled as lower sodium or no salt added. Eat fresh foods. Eat more vegetables, fruits, and low-fat dairy products. Choose whole grains. Look for the word whole as the first word in the ingredient list. Choose fish and skinless chicken or [place] more often than red meat. Limit fish, poultry, and meat to 6 oz (170 g) each day. Limit sweets, desserts, sugars, and sugary drinks. Choose heart-healthy fats. Limit cheese to 1 oz (28 g) per day. Eat more home-cooked food and less restaurant, buffet, and fast food. Limit fried foods. Cook foods using methods other than frying. Limit canned vegetables. If you do use them, rinse them well to decrease the sodium. When eating at a restaurant, ask that your food be prepared with less salt, or no salt if possible.  WHAT FOODS CAN I EAT? Seek help from a dietitian for individual calorie needs.  Grains Whole grain or whole wheat bread. Hoshino rice. Whole grain or whole  wheat pasta. Quinoa, bulgur, and whole grain cereals. Low-sodium cereals. Corn or whole wheat flour tortillas. Whole grain cornbread. Whole grain crackers. Low-sodium crackers.  Vegetables Fresh or frozen vegetables (raw, steamed, roasted, or grilled). Low-sodium or reduced-sodium tomato and vegetable juices. Low-sodium or reduced-sodium tomato sauce and paste. Low-sodium or reduced-sodium canned vegetables.   Fruits All fresh, canned (in natural juice), or frozen fruits.  Meat and Other Protein Products Ground beef (85% or leaner), grass-fed beef, or beef trimmed of fat. Skinless chicken or [place]. Ground chicken or [place]. Pork trimmed of fat. All fish and seafood. Eggs. Dried beans, peas, or lentils. Unsalted nuts and seeds. Unsalted canned beans.  Dairy Low-fat dairy products, such as skim or 1% milk, 2% or reduced-fat cheeses, low-fat ricotta or cottage cheese, or plain low-fat yogurt. Low-sodium or reduced-sodium cheeses.  Fats and Oils Tub margarines without trans fats. Light or reduced-fat mayonnaise and salad dressings (reduced sodium). Avocado. Safflower, olive, or canola oils. Natural peanut or almond butter.  Other Unsalted popcorn and pretzels. The items listed above may not be a complete list of recommended foods or beverages. Contact your dietitian for more options.  WHAT FOODS ARE NOT RECOMMENDED?  Grains White bread. White pasta. White rice. Refined cornbread. Bagels and croissants. Crackers that contain trans fat.  Vegetables Creamed or fried vegetables. Vegetables in a cheese sauce. Regular canned vegetables. Regular canned tomato sauce and paste. Regular tomato and vegetable juices.  Fruits Dried fruits. Canned fruit in light or heavy syrup. Fruit juice.  Meat and Other Protein Products Fatty cuts of meat. Ribs,  chicken wings, bacon, sausage, bologna, salami, chitterlings, fatback, hot dogs, bratwurst, and packaged luncheon meats. Salted nuts and seeds. Canned  beans with salt.  Dairy Whole or 2% milk, cream, half-and-half, and cream cheese. Whole-fat or sweetened yogurt. Full-fat cheeses or blue cheese. Nondairy creamers and whipped toppings. Processed cheese, cheese spreads, or cheese curds.  Condiments Onion and garlic salt, seasoned salt, table salt, and sea salt. Canned and packaged gravies. Worcestershire sauce. Tartar sauce. Barbecue sauce. Teriyaki sauce. Soy sauce, including reduced sodium. Steak sauce. Fish sauce. Oyster sauce. Cocktail sauce. Horseradish. Ketchup and mustard. Meat flavorings and tenderizers. Bouillon cubes. Hot sauce. Tabasco sauce. Marinades. Taco seasonings. Relishes.  Fats and Oils Butter, stick margarine, lard, shortening, ghee, and bacon fat. Coconut, palm kernel, or palm oils. Regular salad dressings.  Other Pickles and olives. Salted popcorn and pretzels.  The items listed above may not be a complete list of foods and beverages to avoid. Contact your dietitian for more information.  WHERE CAN I FIND MORE INFORMATION? National Heart, Lung, and Blood Institute: [email or web address] Document Released: 11/13/2011 Document Revised: 04/10/2014 Document Reviewed: 09/28/2013 [facility] Patient Information 2015 [place], [place]. This information is not intended to replace advice given to you by your health care provider. Make sure you discuss any questions you have with your health care provider.   I think that you would greatly benefit from seeing a nutritionist.  If you are interested, please call Dr. Wonda at [phone] to schedule an appointment.

## 2024-11-25 NOTE — Progress Notes (Signed)
 "    Subjective:  Patient ID: Norma Cameron, female    DOB: 1956-04-29, 68 y.o.   MRN: [id number]  Patient Care Team: Severa Rock HERO, FNP as PCP - General (Family Medicine) Glendia Simmonds, OD as Referring Physician (Optometry) Luis Purchase, MD as Consulting Physician (Gastroenterology) Rosalynn LELON Ingle, MD (Inactive) as Consulting Physician (Obstetrics and Gynecology) Dozier Agent, MD as Referring Physician (Orthopedic Surgery) Livingston Rigg, MD as Consulting Physician (Dermatology)   Chief Complaint:  Hypertension and Gastroesophageal Reflux (Ongoing but seems like it has been getting worse. Has hiatal hernia )   HPI: Norma Cameron is a 68 y.o. female presenting on 11/25/2024 for Hypertension and Gastroesophageal Reflux (Ongoing but seems like it has been getting worse. Has hiatal hernia )    Norma Cameron is a 68 year old female who presents today for chronic follow and with complaints of a hiatal hernia and recurrent reflux symptoms.  She experiences recurrent reflux symptoms attributed to a hiatal hernia diagnosed on recent imaging. Her reflux is described as 'pretty bad' and is currently managed with over-the-counter omeprazole . She has not used Protonix  before. No associated symptoms such as cough, hemoptysis, dysphagia, or voice changes. She denies leg swelling, headache, and chest pain.  She continues to take meloxicam  for back and hip pain, which she finds helpful. She requests a refill as she has recently retired and is transferring her prescriptions to a new pharmacy.  She is on Zetia  and rosuvastatin  for cholesterol management without issues. She has started taking CoQ10 to alleviate joint pain, which she finds beneficial.  In terms of nutrition, she met with a nutritionist who advised increasing her protein intake, especially since she is not a big meat eater. She is trying to incorporate more protein into her diet and is working on improving her water intake. She is also  considering eating mid-morning and mid-afternoon snacks to help with her dietary goals.  She mentions a family history of hernias, as her son and grandmother have experienced similar issues.          Relevant past medical, surgical, family, and social history reviewed and updated as indicated.  Allergies and medications reviewed and updated. Data reviewed: Chart in Epic.   Past Medical History:  Diagnosis Date   Allergy    Anxiety    Atypical nevus 04/13/2002   Left Chest-Slight to Moderate edges free   BCC (basal cell carcinoma of skin) 10/07/2011   Right Inner Shin   Depression    CONTROLLED WITH OCCASIONAL XANAX    Fibrocystic breast disease    GERD (gastroesophageal reflux disease)    Hyperlipidemia    Osteopenia    Prediabetes 09/05/2014   Prediabetes 07/29/2018   Vitamin D  deficiency     Past Surgical History:  Procedure Laterality Date   BLADDER SUSPENSION N/A 09/10/2020   Procedure: TRANSVAGINAL TAPE (TVT) PROCEDURE;  Surgeon: Curlene Agent, MD;  Location: [facility];  Service: Gynecology;  Laterality: N/A;   CARPAL TUNNEL RELEASE Right 12/06/2015   Procedure: RIGHT CARPAL TUNNEL RELEASE;  Surgeon: Franky Curia, MD;  Location: [facility];  Service: Orthopedics;  Laterality: Right;   CERVIX LESION DESTRUCTION     EARLY 20'S   CYSTOSCOPY N/A 09/10/2020   Procedure: CYSTOSCOPY;  Surgeon: Curlene Agent, MD;  Location: [facility];  Service: Gynecology;  Laterality: N/A;   DILATION AND CURETTAGE OF UTERUS  2006   HERNIA REPAIR     AGE 74   NOVASURE ABLATION  2006  SKIN CANCER EXCISION Right 10/2011   LOWER EXT BCC    Social History   Socioeconomic History   Marital status: Married    Spouse name: Not on file   Number of children: Not on file   Years of education: 14   Highest education level: Associate degree: academic program  Occupational History   Not on file  Tobacco Use   Smoking status: Former    Current  packs/day: 0.00    Types: Cigarettes    Quit date: 03/13/1990    Years since quitting: 34.7   Smokeless tobacco: Never  Vaping Use   Vaping status: Never Used  Substance and Sexual Activity   Alcohol use: Yes    Comment: occ   Drug use: No   Sexual activity: Not Currently  Other Topics Concern   Not on file  Social History Narrative   Not on file   Social Drivers of Health   Tobacco Use: Medium Risk (11/25/2024)   Patient History    Smoking Tobacco Use: Former    Smokeless Tobacco Use: Never    Passive Exposure: Not on Actuary Strain: Low Risk (08/31/2024)   Overall Financial Resource Strain (CARDIA)    Difficulty of Paying Living Expenses: Not hard at all  Food Insecurity: No Food Insecurity (08/31/2024)   Epic    Worried About Radiation Protection Practitioner of Food in the Last Year: Never true    Ran Out of Food in the Last Year: Never true  Transportation Needs: No Transportation Needs (08/31/2024)   Epic    Lack of Transportation (Medical): No    Lack of Transportation (Non-Medical): No  Physical Activity: Sufficiently Active (08/31/2024)   Exercise Vital Sign    Days of Exercise per Week: 7 days    Minutes of Exercise per Session: 60 min  Stress: No Stress Concern Present (08/31/2024)   [employer] of Occupational Health - Occupational Stress Questionnaire    Feeling of Stress: Not at all  Social Connections: Moderately Integrated (08/31/2024)   Social Connection and Isolation Panel    Frequency of Communication with Friends and Family: Three times a week    Frequency of Social Gatherings with Friends and Family: Once a week    Attends Religious Services: Never    Database Administrator or Organizations: Yes    Attends Engineer, Structural: More than 4 times per year    Marital Status: Married  Catering Manager Violence: Not At Risk (08/31/2024)   Epic    Fear of Current or Ex-Partner: No    Emotionally Abused: No    Physically Abused: No    Sexually  Abused: No  Depression (PHQ2-9): Low Risk (11/25/2024)   Depression (PHQ2-9)    PHQ-2 Score: 0  Alcohol Screen: Low Risk (08/31/2024)   Alcohol Screen    Last Alcohol Screening Score (AUDIT): 0  Housing: Low Risk (08/31/2024)   Epic    Unable to Pay for Housing in the Last Year: No    Number of Times Moved in the Last Year: 0    Homeless in the Last Year: No  Utilities: Not At Risk (08/31/2024)   Epic    Threatened with loss of utilities: No  Health Literacy: Adequate Health Literacy (08/31/2024)   B1300 Health Literacy    Frequency of need for help with medical instructions: Never    Outpatient Encounter Medications as of 11/25/2024  Medication Sig   Acetaminophen  (TYLENOL  PO) Take by mouth.   ALPRAZolam  (  XANAX ) 0.5 MG tablet Take 1 tablet (0.5 mg total) by mouth 2 (two) times daily. AS NEEDED RARELY TAKES PER PT   cetirizine (ZYRTEC) 10 MG chewable tablet Chew 10 mg by mouth daily.   diphenhydrAMINE HCl (BENADRYL PO) Take by mouth.   escitalopram  (LEXAPRO ) 20 MG tablet Take 1 tablet (20 mg total) by mouth daily.   ipratropium (ATROVENT ) 0.03 % nasal spray Place 2 sprays into both nostrils every 12 (twelve) hours.   Magnesium 400 MG TABS Take 400 mg by mouth daily. CURRENLTY 30 MG DAILY   Multiple Vitamins-Minerals (ONE A DAY WOMEN 50 PLUS) CHEW as directed Orally   pantoprazole  (PROTONIX ) 40 MG tablet Take 1 tablet (40 mg total) by mouth daily.   Probiotic Product (PROBIOTIC DAILY PO) Take by mouth daily.   zolpidem (AMBIEN) 10 MG tablet Take 10 mg by mouth at bedtime as needed for sleep.   [DISCONTINUED] ezetimibe  (ZETIA ) 10 MG tablet Take 1 tablet (10 mg total) by mouth daily.   [DISCONTINUED] meloxicam  (MOBIC ) 15 MG tablet Take 1 tab by mouth every other day for pain   [DISCONTINUED] omeprazole  (PRILOSEC) 40 MG capsule TAKE 1 CAPSULE BY MOUTH EVERY DAY TO PREVENT HEARTBURN & INDIGESTION   [DISCONTINUED] rosuvastatin  (CRESTOR ) 5 MG tablet TAKE 1 TABLET (5 MG TOTAL) BY MOUTH DAILY.    ezetimibe  (ZETIA ) 10 MG tablet Take 1 tablet (10 mg total) by mouth daily.   meloxicam  (MOBIC ) 15 MG tablet Take 1 tab by mouth every other day for pain   rosuvastatin  (CRESTOR ) 5 MG tablet Take 1 tablet (5 mg total) by mouth daily.   [DISCONTINUED] doxycycline  (ADOXA) 100 MG tablet Take 1 tablet (100 mg total) by mouth 2 (two) times daily.   No facility-administered encounter medications on file as of 11/25/2024.    Allergies[1]  Pertinent ROS per HPI, otherwise unremarkable      Objective:  BP 130/75   Pulse 77   Temp 97.6 F (36.4 C)   Ht 5' 1 (1.549 m)   Wt 142 lb 3.2 oz (64.5 kg)   SpO2 95%   BMI 26.87 kg/m    Wt Readings from Last 3 Encounters:  11/25/24 142 lb 3.2 oz (64.5 kg)  11/14/24 145 lb (65.8 kg)  10/19/24 143 lb 9.6 oz (65.1 kg)    Physical Exam Vitals and nursing note reviewed.  Constitutional:      General: She is not in acute distress.    Appearance: Normal appearance. She is well-developed, well-groomed and overweight. She is not ill-appearing, toxic-appearing or diaphoretic.  HENT:     Head: Normocephalic and atraumatic.     Jaw: There is normal jaw occlusion.     Right Ear: Hearing normal.     Left Ear: Hearing normal.     Nose: Nose normal.     Mouth/Throat:     Lips: Pink.     Mouth: Mucous membranes are moist.     Pharynx: Oropharynx is clear. Uvula midline.  Eyes:     General: Lids are normal.     Extraocular Movements: Extraocular movements intact.     Conjunctiva/sclera: Conjunctivae normal.     Pupils: Pupils are equal, round, and reactive to light.  Neck:     Thyroid : No thyroid  mass, thyromegaly or thyroid  tenderness.     Vascular: No carotid bruit or JVD.     Trachea: Trachea and phonation normal.  Cardiovascular:     Rate and Rhythm: Normal rate and regular rhythm.  Chest Wall: PMI is not displaced.     Pulses: Normal pulses.     Heart sounds: Normal heart sounds. No murmur heard.    No friction rub. No gallop.   Pulmonary:     Effort: Pulmonary effort is normal. No respiratory distress.     Breath sounds: Normal breath sounds. No wheezing.  Abdominal:     General: Bowel sounds are normal. There is no abdominal bruit.     Palpations: Abdomen is soft. There is no hepatomegaly or splenomegaly.     Tenderness: There is no abdominal tenderness. There is no guarding or rebound.  Musculoskeletal:     Cervical back: Normal range of motion and neck supple.     Right lower leg: No edema.     Left lower leg: No edema.     Comments: Bilateral hands: PIP joint nodules/swelling   Lymphadenopathy:     Cervical: No cervical adenopathy.  Skin:    General: Skin is warm and dry.     Capillary Refill: Capillary refill takes less than 2 seconds.     Coloration: Skin is not cyanotic, jaundiced or pale.     Findings: No rash.  Neurological:     General: No focal deficit present.     Mental Status: She is alert and oriented to person, place, and time.     Sensory: Sensation is intact.     Motor: Motor function is intact.     Coordination: Coordination is intact.     Gait: Gait is intact.     Deep Tendon Reflexes: Reflexes are normal and symmetric.  Psychiatric:        Attention and Perception: Attention and perception normal.        Mood and Affect: Mood and affect normal.        Speech: Speech normal.        Behavior: Behavior normal. Behavior is cooperative.        Thought Content: Thought content normal.        Cognition and Memory: Cognition and memory normal.        Judgment: Judgment normal.      Results for orders placed or performed in visit on 10/04/24  HM MAMMOGRAPHY   Collection Time: 09/20/24 11:21 AM  Result Value Ref Range   HM Mammogram 0-4 Bi-Rad 0-4 Bi-Rad, Self Reported Normal  HM PAP SMEAR   Collection Time: 09/23/24  2:51 PM  Result Value Ref Range   HM Pap smear NILM        Pertinent labs & imaging results that were available during my care of the patient were reviewed by me  and considered in my medical decision making.  Assessment & Plan:  Norma Cameron was seen today for hypertension and gastroesophageal reflux.  Diagnoses and all orders for this visit:  Gastroesophageal reflux disease without esophagitis -     Ambulatory referral to Gastroenterology  Mixed hyperlipidemia -     ezetimibe  (ZETIA ) 10 MG tablet; Take 1 tablet (10 mg total) by mouth daily. -     rosuvastatin  (CRESTOR ) 5 MG tablet; Take 1 tablet (5 mg total) by mouth daily.  Lumbar pain -     meloxicam  (MOBIC ) 15 MG tablet; Take 1 tab by mouth every other day for pain  Bilateral hip pain -     meloxicam  (MOBIC ) 15 MG tablet; Take 1 tab by mouth every other day for pain  Hiatal hernia -     Ambulatory referral to Gastroenterology  Other orders -  pantoprazole  (PROTONIX ) 40 MG tablet; Take 1 tablet (40 mg total) by mouth daily.       Gastroesophageal reflux disease with hiatal hernia Recurrent reflux symptoms with a known hiatal hernia. Symptoms are exacerbated by large meals, spicy foods, and carbonated beverages. No associated cough, hemoptysis, dysphagia, or voice changes. - Prescribed Protonix  for reflux management. - Advised small, frequent meals and avoidance of spicy and carbonated foods. - Referred to a gastroenterologist for further evaluation.  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 - Continue meloxicam  for pain management. - Refilled meloxicam  prescription.  Mixed hyperlipidemia Managed with Zetia  and rosuvastatin . No reported issues with current medications. CoQ10 supplementation has been beneficial for joint pain associated with statin use. - Continue Zetia  and rosuvastatin . - Continue CoQ10 supplementation.          Continue all other maintenance medications.  Follow up plan: Return in 6 months (on 05/26/2025), or if symptoms worsen or fail to improve, for Annual Physical.   Continue healthy  lifestyle choices, including diet (rich in fruits, vegetables, and lean proteins, and low in salt and simple carbohydrates) and exercise (at least 30 minutes of moderate physical activity daily).  Educational handout given for DASH diet, HTN  The above assessment and management plan was discussed with the patient. The patient verbalized understanding of and has agreed to the management plan. Patient is aware to call the clinic if they develop any new symptoms or if symptoms persist or worsen. Patient is aware when to return to the clinic for a follow-up visit. Patient educated on when it is appropriate to go to the emergency department.   Norma Bruns, FNP-C [facility] [phone]      [1]  Allergies Allergen Reactions   Acyclovir And Related    Bacitra-Neomycin-Polymyxin-Hc Hives   Egg Protein-Containing Drug Products Hives    Patient states she eats eggs with no problem.  Received propofol  with no reaction.    Monascus Purpureus Went Yeast Other (See Comments)    MYALGIA with higher doses  red yeast rice   Neosporin [Neomycin-Bacitracin Zn-Polymyx] Hives   Penicillins Other (See Comments)    Product containing penicillin (product)   Red Yeast Rice [Cholestin] Other (See Comments)    MYALGIA with higher doses   Sulfa Antibiotics Hives and Other (See Comments)   Zostavax [Zoster Vaccine Live] Rash   "

## 2024-12-06 ENCOUNTER — Encounter (INDEPENDENT_AMBULATORY_CARE_PROVIDER_SITE_OTHER): Payer: Self-pay | Admitting: *Deleted

## 2024-12-12 ENCOUNTER — Ambulatory Visit
Admission: EM | Admit: 2024-12-12 | Discharge: 2024-12-12 | Disposition: A | Discharge status: Home / Self Care | Attending: Family Medicine | Admitting: Family Medicine

## 2024-12-12 ENCOUNTER — Ambulatory Visit: Payer: Self-pay

## 2024-12-12 DIAGNOSIS — J208 Acute bronchitis due to other specified organisms: Secondary | ICD-10-CM

## 2024-12-12 DIAGNOSIS — R030 Elevated blood-pressure reading, without diagnosis of hypertension: Secondary | ICD-10-CM

## 2024-12-12 MED ORDER — ALBUTEROL SULFATE HFA 108 (90 BASE) MCG/ACT IN AERS: 2.0000 | INHALATION_SPRAY | RESPIRATORY_TRACT | 0 refills | Status: AC | PRN

## 2024-12-12 MED ORDER — PROMETHAZINE-DM 6.25-15 MG/5ML PO SYRP: 5.0000 mL | ORAL_SOLUTION | Freq: Four times a day (QID) | ORAL | 0 refills | Status: AC | PRN

## 2024-12-12 MED ORDER — PREDNISONE 20 MG PO TABS: 40.0000 mg | ORAL_TABLET | Freq: Every day | ORAL | 0 refills | Status: DC

## 2024-12-12 NOTE — ED Provider Notes (Signed)
 " [facility]    CSN: [id number] Arrival date & time: 12/12/24  1031      History   Chief Complaint Chief Complaint  Patient presents with   Cough    HPI Norma Cameron is a 69 y.o. female.   Patient presenting today with just over a week of ongoing hacking cough, headache, runny nose.  Denies fever, chills, chest pain, shortness of breath, abdominal pain, vomiting, diarrhea.  So far trying Delsym with only mild temporary benefit.  No known history of chronic pulmonary disease.    Past Medical History:  Diagnosis Date   Allergy    Anxiety    Atypical nevus 04/13/2002   Left Chest-Slight to Moderate edges free   BCC (basal cell carcinoma of skin) 10/07/2011   Right Inner Shin   Depression    CONTROLLED WITH OCCASIONAL XANAX    Fibrocystic breast disease    GERD (gastroesophageal reflux disease)    Hyperlipidemia    Osteopenia    Prediabetes 09/05/2014   Prediabetes 07/29/2018   Vitamin D  deficiency     Patient Active Problem List   Diagnosis Date Noted   Hiatal hernia 11/25/2024   Chilblains 05/11/2024   Primary osteoarthritis 05/11/2024   Vitamin B12 deficiency 05/11/2024   Generalized anxiety disorder 05/11/2024   Gastroesophageal reflux disease without esophagitis 12/11/2021   Osteopenia 07/20/2020   Agatston coronary artery calcium  score less than 100 04/12/2020   Prediabetes 07/29/2018   Climacteric 09/05/2014   Hyperlipidemia    Vitamin D  deficiency    Depression, recurrent     Past Surgical History:  Procedure Laterality Date   BLADDER SUSPENSION N/A 09/10/2020   Procedure: TRANSVAGINAL TAPE (TVT) PROCEDURE;  Surgeon: Curlene Agent, MD;  Location: [facility];  Service: Gynecology;  Laterality: N/A;   CARPAL TUNNEL RELEASE Right 12/06/2015   Procedure: RIGHT CARPAL TUNNEL RELEASE;  Surgeon: Franky Curia, MD;  Location: [facility];  Service: Orthopedics;  Laterality: Right;   CERVIX LESION DESTRUCTION     EARLY  20'S   CYSTOSCOPY N/A 09/10/2020   Procedure: CYSTOSCOPY;  Surgeon: Curlene Agent, MD;  Location: [facility];  Service: Gynecology;  Laterality: N/A;   DILATION AND CURETTAGE OF UTERUS  2006   HERNIA REPAIR     AGE 15   NOVASURE ABLATION  2006   SKIN CANCER EXCISION Right 10/2011   LOWER EXT BCC    OB History   No obstetric history on file.      Home Medications    Prior to Admission medications  Medication Sig Start Date End Date Taking? Authorizing Provider  albuterol  (VENTOLIN  HFA) 108 (90 Base) MCG/ACT inhaler Inhale 2 puffs into the lungs every 4 (four) hours as needed. 12/12/24  Yes Stuart Vernell Norris, PA-C  cetirizine (ZYRTEC) 10 MG chewable tablet Chew 10 mg by mouth daily.   Yes [provider]  diphenhydrAMINE HCl (BENADRYL PO) Take by mouth.   Yes [provider]  escitalopram  (LEXAPRO ) 20 MG tablet Take 1 tablet (20 mg total) by mouth daily. 09/28/24  Yes Rakes, Rock HERO, FNP  ezetimibe  (ZETIA ) 10 MG tablet Take 1 tablet (10 mg total) by mouth daily. 11/25/24 11/25/25 Yes Rakes, Rock HERO, FNP  Magnesium 400 MG TABS Take 400 mg by mouth daily. CURRENLTY 30 MG DAILY   Yes [provider]  meloxicam  (MOBIC ) 15 MG tablet Take 1 tab by mouth every other day for pain 11/25/24  Yes Rakes, Rock HERO, FNP  Multiple  Vitamins-Minerals (ONE A DAY WOMEN 50 PLUS) CHEW as directed Orally 08/27/22  Yes [provider]  pantoprazole  (PROTONIX ) 40 MG tablet Take 1 tablet (40 mg total) by mouth daily. 11/25/24  Yes Severa Rock HERO, FNP  predniSONE  (DELTASONE ) 20 MG tablet Take 2 tablets (40 mg total) by mouth daily with breakfast. 12/12/24  Yes Stuart Vernell Norris, PA-C  Probiotic Product (PROBIOTIC DAILY PO) Take by mouth daily.   Yes [provider]  promethazine -dextromethorphan (PROMETHAZINE -DM) 6.25-15 MG/5ML syrup Take 5 mLs by mouth 4 (four) times daily as needed. 12/12/24  Yes Stuart Vernell Norris, PA-C  rosuvastatin  (CRESTOR )  5 MG tablet Take 1 tablet (5 mg total) by mouth daily. 11/25/24 11/25/25 Yes Rakes, Rock HERO, FNP  zolpidem (AMBIEN) 10 MG tablet Take 10 mg by mouth at bedtime as needed for sleep.   Yes [provider]  Acetaminophen  (TYLENOL  PO) Take by mouth.    [provider]  ALPRAZolam  (XANAX ) 0.5 MG tablet Take 1 tablet (0.5 mg total) by mouth 2 (two) times daily. AS NEEDED RARELY TAKES PER PT 12/11/21   Wilkinson, Dana E, NP  ipratropium (ATROVENT ) 0.03 % nasal spray Place 2 sprays into both nostrils every 12 (twelve) hours. 11/09/24   Gladis Elsie BROCKS, PA-C    Family History Family History  Problem Relation Age of Onset   Hypertension Mother    Hyperlipidemia Mother    Rheum arthritis Mother    Brain cancer Mother    Heart disease Father        CABG   Hyperlipidemia Father    Hypertension Father    Cancer Paternal Uncle        THROAT   Heart disease Paternal Grandfather    Diabetes Paternal Grandfather     Social History Social History[1]   Allergies   Acyclovir and related, Bacitra-neomycin-polymyxin-hc, Egg protein-containing drug products, Monascus purpureus went yeast, Neosporin [neomycin-bacitracin zn-polymyx], Penicillins, Red yeast rice [cholestin], Sulfa antibiotics, and Zostavax [zoster vaccine live]   Review of Systems Review of Systems PER HPI  Physical Exam Triage Vital Signs ED Triage Vitals  Encounter Vitals Group     BP 12/12/24 1136 (!) 155/85     Girls Systolic BP Percentile --      Girls Diastolic BP Percentile --      Boys Systolic BP Percentile --      Boys Diastolic BP Percentile --      Pulse Rate 12/12/24 1136 68     Resp 12/12/24 1136 18     Temp 12/12/24 1136 98.3 F (36.8 C)     Temp Source 12/12/24 1136 Oral     SpO2 12/12/24 1136 96 %     Weight --      Height --      Head Circumference --      Peak Flow --      Pain Score 12/12/24 1138 4     Pain Loc --      Pain Education --      Exclude from Growth Chart --    No data  found.  Updated Vital Signs BP (!) 155/85 (BP Location: Right Arm)   Pulse 68   Temp 98.3 F (36.8 C) (Oral)   Resp 18   SpO2 96%   Visual Acuity Right Eye Distance:   Left Eye Distance:   Bilateral Distance:    Right Eye Near:   Left Eye Near:    Bilateral Near:     Physical Exam Vitals and  nursing note reviewed.  Constitutional:      Appearance: Normal appearance.  HENT:     Head: Atraumatic.     Right Ear: Tympanic membrane and external ear normal.     Left Ear: Tympanic membrane and external ear normal.     Nose: Rhinorrhea present.     Mouth/Throat:     Mouth: Mucous membranes are moist.     Pharynx: No posterior oropharyngeal erythema.  Eyes:     Extraocular Movements: Extraocular movements intact.     Conjunctiva/sclera: Conjunctivae normal.  Cardiovascular:     Rate and Rhythm: Normal rate and regular rhythm.     Heart sounds: Normal heart sounds.  Pulmonary:     Effort: Pulmonary effort is normal.     Breath sounds: Normal breath sounds. No wheezing or rales.  Musculoskeletal:        General: Normal range of motion.     Cervical back: Normal range of motion and neck supple.  Skin:    General: Skin is warm and dry.  Neurological:     Mental Status: She is alert and oriented to person, place, and time.  Psychiatric:        Mood and Affect: Mood normal.        Thought Content: Thought content normal.      UC Treatments / Results  Labs (all labs ordered are listed, but only abnormal results are displayed) Labs Reviewed - No data to display  EKG   Radiology No results found.  Procedures Procedures (including critical care time)  Medications Ordered in UC Medications - No data to display  Initial Impression / Assessment and Plan / UC Course  I have reviewed the triage vital signs and the nursing notes.  Pertinent labs & imaging results that were available during my care of the patient were reviewed by me and considered in my medical  decision making (see chart for details).     Suspect viral bronchitis, treat with prednisone , Phenergan  DM, albuterol , supportive over-the-counter medications and home care.  Return for worsening or unresolving symptoms.  Blood pressure mildly elevated today, continue to monitor and follow-up if not resolving to normal range.  Final Clinical Impressions(s) / UC Diagnoses   Final diagnoses:  Viral bronchitis  Elevated blood pressure reading   Discharge Instructions   None    ED Prescriptions     Medication Sig Dispense Auth. Provider   promethazine -dextromethorphan (PROMETHAZINE -DM) 6.25-15 MG/5ML syrup Take 5 mLs by mouth 4 (four) times daily as needed. 100 mL Stuart Vernell Norris, PA-C   predniSONE  (DELTASONE ) 20 MG tablet Take 2 tablets (40 mg total) by mouth daily with breakfast. 10 tablet Stuart Vernell Norris, PA-C   albuterol  (VENTOLIN  HFA) 108 (90 Base) MCG/ACT inhaler Inhale 2 puffs into the lungs every 4 (four) hours as needed. 18 g Stuart Vernell Norris, [place]      PDMP not reviewed this encounter.    [1]  Social History Tobacco Use   Smoking status: Former    Current packs/day: 0.00    Types: Cigarettes    Quit date: 03/13/1990    Years since quitting: 34.7   Smokeless tobacco: Never  Vaping Use   Vaping status: Never Used  Substance Use Topics   Alcohol use: Yes    Comment: occ   Drug use: No     Stuart Vernell Norris, PA-C 12/12/24 1228  "

## 2024-12-12 NOTE — ED Triage Notes (Signed)
 Cough, headache x 1.5 weeks. Taking delsym with no relief of symptoms.

## 2024-12-12 NOTE — Telephone Encounter (Signed)
 FYI Only or Action Required?: FYI only for provider: UC advised .  Patient was last seen in primary care on 11/25/2024 by Severa Rock HERO, FNP.  Called Nurse Triage reporting Cough.  Symptoms began a week ago.  Interventions attempted: OTC medications: delsyum .  Symptoms are: gradually worsening.  Triage Disposition: See HCP Within 4 Hours (Or PCP Triage)  Patient/caregiver understands and will follow disposition?: Yes                Reason for Disposition  [1] MILD difficulty breathing (e.g., minimal/no SOB at rest, SOB with walking, pulse < 100) AND [2] still present when not coughing  Answer Assessment - Initial Assessment Questions Patient reports the following sick since last Sunday, taking 2 bottles delsym. Coughing and coughing, sometimes something comes up gren ish and clear  other times does. Nasal congestion. Did have tremendous headache that  has gone away now.  Sometimes having difficulty breathing on occasion at rest when takes a deep breath doesn't feel like going anywhere.  Had some oatmeal this morning. No same day visits today. Patient advised UC for cough patient agreeable and going to [facility]    1. ONSET: When did the cough begin?      Last sunday 2. SEVERITY: How bad is the cough today?      Getting worse  3. SPUTUM: Describe the color of your sputum (e.g., none, dry cough; clear, white, yellow, green)     Greenish to clear  4. HEMOPTYSIS: Are you coughing up any blood? If Yes, ask: How much? (e.g., flecks, streaks, tablespoons, etc.)     Denies  5. DIFFICULTY BREATHING: Are you having difficulty breathing? If Yes, ask: How bad is it? (e.g., mild, moderate, severe)      On occasion short of breath with no acitivty not all the time, not currently feeling short of breath and endorsing able to take deep breaths and speaking in full sentences  6. FEVER: Do you have a fever? If Yes, ask: What is your temperature, how was it  measured, and when did it start?     No reported    8. LUNG HISTORY: Do you have any history of lung disease?  (e.g., pulmonary embolus, asthma, emphysema)     Denies   10. OTHER SYMPTOMS: Do you have any other symptoms? (e.g., runny nose, wheezing, chest pain)       Nasal congestion  Patient denies the following chest pain  Protocols used: Cough - Acute Productive-A-AH   Copied from CRM #[id number]. Topic: Clinical - Red Word Triage >> Dec 12, 2024  9:19 AM Graeme ORN wrote: Red Word that prompted transfer to Nurse Triage: Increased, worsening cough, not breathing as good as she should. Some phlegm. Took two bottles of Delsym not getting better. Feels bad overall.

## 2024-12-12 NOTE — Telephone Encounter (Signed)
Noted  -LS

## 2024-12-15 ENCOUNTER — Encounter: Payer: Medicare Other | Admitting: Nurse Practitioner

## 2025-01-04 ENCOUNTER — Ambulatory Visit (INDEPENDENT_AMBULATORY_CARE_PROVIDER_SITE_OTHER): Admitting: Gastroenterology

## 2025-01-04 ENCOUNTER — Encounter (INDEPENDENT_AMBULATORY_CARE_PROVIDER_SITE_OTHER): Payer: Self-pay | Admitting: Gastroenterology

## 2025-01-04 ENCOUNTER — Telehealth (INDEPENDENT_AMBULATORY_CARE_PROVIDER_SITE_OTHER): Payer: Self-pay

## 2025-01-04 ENCOUNTER — Telehealth: Payer: Self-pay

## 2025-01-04 VITALS — BP 146/79 | HR 73 | Temp 97.4°F | Ht 59.0 in | Wt 145.6 lb

## 2025-01-04 DIAGNOSIS — R1310 Dysphagia, unspecified: Secondary | ICD-10-CM | POA: Diagnosis not present

## 2025-01-04 DIAGNOSIS — I10 Essential (primary) hypertension: Secondary | ICD-10-CM | POA: Insufficient documentation

## 2025-01-04 DIAGNOSIS — K219 Gastro-esophageal reflux disease without esophagitis: Secondary | ICD-10-CM | POA: Insufficient documentation

## 2025-01-04 DIAGNOSIS — K449 Diaphragmatic hernia without obstruction or gangrene: Secondary | ICD-10-CM

## 2025-01-04 NOTE — Telephone Encounter (Signed)
 PA on [employer] for EGD/DIL: 43248--Prior authorization is not required for this code. 43235--Approved Authorization [id number]  Tracking [id number]

## 2025-01-04 NOTE — Telephone Encounter (Signed)
 Spoke with patient in the office, scheduled EGD/DIL for 01/09/2025 at 1pm. Instructions given to patient.

## 2025-01-04 NOTE — Telephone Encounter (Signed)
 Left message for patient to call back and sent mychart message. Need to know how long patient has gone to PT at [facility] office. If not at least 6 weeks need to reschedule.  I also called [employer] office and left a message about getting notes if they have any.

## 2025-01-04 NOTE — Progress Notes (Signed)
 Norma Cameron , M.D. Gastroenterology & Hepatology [facility] [address] [place], [place] [address] Primary Care Physician: Severa Rock HERO, FNP [address] [place] [address]  Chief Complaint:  GERD , hiatal hernia  History of Present Illness: Norma Cameron is a 69 y.o. female with hypertension, hyperlipidemia who presents for evaluation of GERD and hiatal hernia  Patient reports that she has symptoms of reflux for a long time and has been taking PPI mostly omeprazole .  Patient reports if you miss any dose of her PPI in the morning her symptoms would worsen.  Patient recently feels if the food is sitting in her chest and would go down slowly.  She was recently started on Protonix  and symptoms have remained almost the same.Patient had x-ray which demonstrated moderate to large hiatal hernia.  Patient would occasionally vomit food after eating.  The patient denies having any nausea,  fever, chills, hematochezia, melena, hematemesis, abdominal distention, abdominal pain, diarrhea, jaundice, pruritus or weight loss.  Last ZHI:wnwz Last Colonoscopy: 2014: Unable to open the actual report  FHx: neg for any gastrointestinal/liver disease, no malignancies Social: neg smoking, alcohol or illicit drug use  Labs from 05/2024 hemoglobin 13.3 platelet 246 normal liver enzymes creatinine 0.96 Past Medical History: Past Medical History:  Diagnosis Date   Allergy    Anxiety    Atypical nevus 04/13/2002   Left Chest-Slight to Moderate edges free   BCC (basal cell carcinoma of skin) 10/07/2011   Right Inner Shin   Depression    CONTROLLED WITH OCCASIONAL XANAX    Fibrocystic breast disease    GERD (gastroesophageal reflux disease)    Hyperlipidemia    Osteopenia    Prediabetes 09/05/2014   Prediabetes 07/29/2018   Vitamin D  deficiency     Past Surgical History: Past Surgical History:  Procedure Laterality Date   BLADDER SUSPENSION  N/A 09/10/2020   Procedure: TRANSVAGINAL TAPE (TVT) PROCEDURE;  Surgeon: Curlene Agent, MD;  Location: [facility] [place];  Service: Gynecology;  Laterality: N/A;   CARPAL TUNNEL RELEASE Right 12/06/2015   Procedure: RIGHT CARPAL TUNNEL RELEASE;  Surgeon: Franky Curia, MD;  Location: [facility];  Service: Orthopedics;  Laterality: Right;   CERVIX LESION DESTRUCTION     EARLY 20'S   CYSTOSCOPY N/A 09/10/2020   Procedure: CYSTOSCOPY;  Surgeon: Curlene Agent, MD;  Location: [facility];  Service: Gynecology;  Laterality: N/A;   DILATION AND CURETTAGE OF UTERUS  2006   HERNIA REPAIR     AGE 56   NOVASURE ABLATION  2006   SKIN CANCER EXCISION Right 10/2011   LOWER EXT BCC    Family History: Family History  Problem Relation Age of Onset   Hypertension Mother    Hyperlipidemia Mother    Rheum arthritis Mother    Brain cancer Mother    Heart disease Father        CABG   Hyperlipidemia Father    Hypertension Father    Cancer Paternal Uncle        THROAT   Heart disease Paternal Grandfather    Diabetes Paternal Grandfather     Social History:Tobacco Use History[1] Social History   Substance and Sexual Activity  Alcohol Use Yes   Comment: occ   Social History   Substance and Sexual Activity  Drug Use No    Allergies: Allergies[2]  Medications: Current Outpatient Medications  Medication Sig Dispense Refill   Acetaminophen  (TYLENOL  PO) Take by mouth. (  Patient taking differently: Take by mouth. PRN)     albuterol  (VENTOLIN  HFA) 108 (90 Base) MCG/ACT inhaler Inhale 2 puffs into the lungs every 4 (four) hours as needed. (Patient taking differently: Inhale 2 puffs into the lungs every 4 (four) hours as needed. Has not taken in weeks) 18 g 0   ALPRAZolam  (XANAX ) 0.5 MG tablet Take 1 tablet (0.5 mg total) by mouth 2 (two) times daily. AS NEEDED RARELY TAKES PER PT (Patient taking differently: Take 0.5 mg by mouth 2 (two) times daily. AS NEEDED  RARELY TAKES PER PT Once a day) 45 tablet 0   cetirizine (ZYRTEC) 10 MG chewable tablet Chew 10 mg by mouth daily.     Coenzyme Q10 (COQ10 PO) Take by mouth.     diphenhydrAMINE HCl (BENADRYL PO) Take by mouth.     escitalopram  (LEXAPRO ) 20 MG tablet Take 1 tablet (20 mg total) by mouth daily. 90 tablet 3   ezetimibe  (ZETIA ) 10 MG tablet Take 1 tablet (10 mg total) by mouth daily. 90 tablet 1   Magnesium 400 MG TABS Take 400 mg by mouth daily. CURRENLTY 30 MG DAILY (Patient taking differently: Take 400 mg by mouth daily. CURRENLTY 30 MG DAILY Takes at night)     meloxicam  (MOBIC ) 15 MG tablet Take 1 tab by mouth every other day for pain (Patient taking differently: daily) 45 tablet 3   Multiple Vitamins-Minerals (ONE A DAY WOMEN 50 PLUS) CHEW as directed Orally     pantoprazole  (PROTONIX ) 40 MG tablet Take 1 tablet (40 mg total) by mouth daily. 90 tablet 3   Probiotic Product (PROBIOTIC DAILY PO) Take by mouth daily.     rosuvastatin  (CRESTOR ) 5 MG tablet Take 1 tablet (5 mg total) by mouth daily. 90 tablet 1   zolpidem (AMBIEN) 10 MG tablet Take 10 mg by mouth at bedtime as needed for sleep. (Patient taking differently: Take 10 mg by mouth at bedtime as needed for sleep. Takes 1/2 at night)     ipratropium (ATROVENT ) 0.03 % nasal spray Place 2 sprays into both nostrils every 12 (twelve) hours. (Patient not taking: Reported on 01/04/2025) 30 mL 0   promethazine -dextromethorphan (PROMETHAZINE -DM) 6.25-15 MG/5ML syrup Take 5 mLs by mouth 4 (four) times daily as needed. (Patient not taking: Reported on 01/04/2025) 100 mL 0   No current facility-administered medications for this visit.    Review of Systems: GENERAL: negative for malaise, night sweats HEENT: No changes in hearing or vision, no nose bleeds or other nasal problems. NECK: Negative for lumps, goiter, pain and significant neck swelling RESPIRATORY: Negative for cough, wheezing CARDIOVASCULAR: Negative for chest pain, leg swelling,  palpitations, orthopnea GI: SEE HPI MUSCULOSKELETAL: Negative for joint pain or swelling, back pain, and muscle pain. SKIN: Negative for lesions, rash HEMATOLOGY Negative for prolonged bleeding, bruising easily, and swollen nodes. ENDOCRINE: Negative for cold or heat intolerance, polyuria, polydipsia and goiter. NEURO: negative for tremor, gait imbalance, syncope and seizures. The remainder of the review of systems is noncontributory.   Physical Exam: BP (!) 146/79   Pulse 73   Temp (!) 97.4 F (36.3 C)   Ht 4' 11 (1.499 m)   Wt 145 lb 9.6 oz (66 kg)   BMI 29.41 kg/m  GENERAL: The patient is AO x3, in no acute distress. HEENT: Head is normocephalic and atraumatic. EOMI are intact. Mouth is well hydrated and without lesions. NECK: Supple. No masses LUNGS: Clear to auscultation. No presence of rhonchi/wheezing/rales. Adequate chest expansion HEART: RRR,  normal s1 and s2. ABDOMEN: Soft, nontender, no guarding, no peritoneal signs, and nondistended. BS +. No masses.   Imaging/Labs: as above     Latest Ref Rng & Units 05/11/2024   10:22 AM 12/16/2023   10:28 AM 06/03/2023   11:39 AM  CBC  WBC 3.4 - 10.8 x10E3/uL 6.4  7.1  7.7   Hemoglobin 11.1 - 15.9 g/dL 86.6  86.4  87.0   Hematocrit 34.0 - 46.6 % 41.0  41.7  38.8   Platelets 150 - 450 x10E3/uL 246  323  282    Lab Results  Component Value Date   IRON 88 04/29/2016   TIBC 376 04/29/2016    I personally reviewed and interpreted the available labs, imaging and endoscopic files.  Xray  IMPRESSION: 1. 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 2. 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 3. Moderate to large hiatal hernia. 4. Aortic atherosclerosis.  Impression and Plan: Norma Cameron is a 69 y.o. female with hypertension, hyperlipidemia  who presents for evaluation of GERD and hiatal hernia  #GERD #Dysphagia #Hiatal hernia  Patient have symptoms of GERD with underlying hiatal hernia. Has occasional solid food dysphagia which could be sequela of chronic GERD with peptic stricture or due to underlying hiatal hernia   Would recommend upper endoscopy to evaluate the size of the hernia and any chronic sequelae of GERD such as peptic stricture, esophagitis or Barrett's esophagus  Given patient persistent symptoms despite PPI we will refer to the surgeon for hiatal hernia repair evaluation after upper endoscopy  Upper endoscopy+ dilation I recommend taking Protonix  40mg  , 30 min before breakfast  GERD recommendations:  1) Avoid coffee, tea, cola beverages, carbonated beverages, spicy foods, greasy foods, foods high in acid content (e.g. tomatoes and citrus fruits), chocolate, and peppermint 2) Avoid drinking alcoholic beverages 3) Avoid smoking 4) Eat small meals and keep weight within normal range 5) Avoid recumbent posture for 3 hours post-prandially 6) Elevate head of bed  #HCM  Patient has colonoscopy scheduled with [facility], I discussed with patient if she would rather have upper endoscopy with [facility].  Patient wanted to proceed with scheduling upper endoscopy here  #HTN  The patient was found to have elevated blood pressure when vital signs were checked in the office. The blood pressure was rechecked by the nursing staff and it was found be persistently elevated >140/90 mmHg. I personally advised to the patient to follow up closely with PCP for hypertension control.   All questions were answered.      Adeola Dennen Faizan Rashid Whitenight, MD Gastroenterology and Hepatology [facility] Gastroenterology   This chart has been completed using [facility] Dictation software, and while attempts have been made to ensure accuracy , certain words and phrases may not be transcribed as intended      [1]  Social  History Tobacco Use  Smoking Status Former   Current packs/day: 0.00   Types: Cigarettes   Quit date: 03/13/1990   Years since quitting: 34.8  Smokeless Tobacco Never  [2]  Allergies Allergen Reactions   Acyclovir And Related    Bacitra-Neomycin-Polymyxin-Hc Hives   Egg Protein-Containing Drug Products Hives    Patient states she eats eggs with no problem.  Received propofol  with no reaction.    Monascus Purpureus [employer]  Yeast Other (See Comments)    MYALGIA with higher doses  red yeast rice   Neosporin [Neomycin-Bacitracin Zn-Polymyx] Hives   Penicillins Other (See Comments)    Product containing penicillin (product)   Red Yeast Rice [Cholestin] Other (See Comments)    MYALGIA with higher doses   Sulfa Antibiotics Hives and Other (See Comments)   Zostavax [Zoster Vaccine Live] Rash

## 2025-01-04 NOTE — Patient Instructions (Signed)
 It was very nice to meet you today, as dicussed with will plan for the following :  1) upper endoscopy with dilation  2) I recommend taking Protonix  40mg  , 30 min before breakfast   3) GERD recommendations:  1) Avoid coffee, tea, cola beverages, carbonated beverages, spicy foods, greasy foods, foods high in acid content (e.g. tomatoes and citrus fruits), chocolate, and peppermint 2) Avoid drinking alcoholic beverages 3) Avoid smoking 4) Eat small meals and keep weight within normal range 5) Avoid recumbent posture for 3 hours post-prandially 6) Elevate head of bed

## 2025-01-04 NOTE — Telephone Encounter (Signed)
 Patient has not had a chance to get PT done. Follow up appointment cancelled and patient was advised to call us  back to reschedule once she completes at least 6 weeks of therapy.

## 2025-01-05 ENCOUNTER — Encounter (HOSPITAL_COMMUNITY): Payer: Self-pay | Admitting: *Deleted

## 2025-01-06 ENCOUNTER — Encounter (HOSPITAL_COMMUNITY)
Admission: RE | Admit: 2025-01-06 | Discharge: 2025-01-06 | Disposition: A | Source: Ambulatory Visit | Attending: Gastroenterology

## 2025-01-06 ENCOUNTER — Encounter (HOSPITAL_COMMUNITY): Payer: Self-pay

## 2025-01-06 ENCOUNTER — Telehealth (INDEPENDENT_AMBULATORY_CARE_PROVIDER_SITE_OTHER): Payer: Self-pay

## 2025-01-06 ENCOUNTER — Other Ambulatory Visit: Payer: Self-pay

## 2025-01-06 NOTE — Telephone Encounter (Signed)
 Patient is going to keep procedure for now and states she has the number to call just in case but she did not want to move up to the morning.

## 2025-01-06 NOTE — Pre-Procedure Instructions (Signed)
 Attempted pre-op phonecall. Left VM for her to call us  back.

## 2025-01-09 ENCOUNTER — Encounter (HOSPITAL_COMMUNITY): Admission: RE | Payer: Self-pay | Source: Home / Self Care

## 2025-01-09 ENCOUNTER — Ambulatory Visit (HOSPITAL_COMMUNITY): Admission: RE | Admit: 2025-01-09 | Source: Home / Self Care | Admitting: Gastroenterology

## 2025-01-10 ENCOUNTER — Ambulatory Visit: Admitting: Orthopedic Surgery

## 2025-01-10 NOTE — Telephone Encounter (Signed)
 Spoke with patient, rescheduled procedure and added colonoscopy. Patient has prep. Sent updated procedure instructions.

## 2025-01-25 ENCOUNTER — Encounter (HOSPITAL_COMMUNITY): Admission: RE | Payer: Self-pay

## 2025-01-25 ENCOUNTER — Ambulatory Visit (HOSPITAL_COMMUNITY): Admission: RE | Admit: 2025-01-25 | Admitting: Gastroenterology

## 2025-03-29 ENCOUNTER — Ambulatory Visit: Payer: Self-pay | Admitting: Family Medicine

## 2025-05-30 ENCOUNTER — Encounter: Admitting: Family Medicine

## 2025-09-01 ENCOUNTER — Ambulatory Visit: Payer: Self-pay
